# Patient Record
Sex: Female | Born: 1935 | Race: White | Hispanic: No | Smoking: Never smoker
Health system: Southern US, Community
[De-identification: ages and names within clinical notes are randomized; demographics above are authoritative.]

## PROBLEM LIST (undated history)

## (undated) DIAGNOSIS — IMO0001 Reserved for inherently not codable concepts without codable children: Secondary | ICD-10-CM

## (undated) DIAGNOSIS — Z5189 Encounter for other specified aftercare: Secondary | ICD-10-CM

## (undated) DIAGNOSIS — I2510 Atherosclerotic heart disease of native coronary artery without angina pectoris: Secondary | ICD-10-CM

## (undated) DIAGNOSIS — I4891 Unspecified atrial fibrillation: Secondary | ICD-10-CM

## (undated) DIAGNOSIS — I10 Essential (primary) hypertension: Secondary | ICD-10-CM

## (undated) DIAGNOSIS — I509 Heart failure, unspecified: Secondary | ICD-10-CM

## (undated) DIAGNOSIS — I739 Peripheral vascular disease, unspecified: Secondary | ICD-10-CM

## (undated) DIAGNOSIS — E119 Type 2 diabetes mellitus without complications: Secondary | ICD-10-CM

## (undated) HISTORY — PX: BACK SURGERY: SHX140

## (undated) HISTORY — PX: CORONARY ARTERY BYPASS GRAFT: SHX141

## (undated) HISTORY — PX: VASCULAR SURGERY: SHX849

## (undated) HISTORY — PX: OTHER SURGICAL HISTORY: SHX169

---

## 1997-10-16 ENCOUNTER — Ambulatory Visit (HOSPITAL_COMMUNITY): Admission: RE | Admit: 1997-10-16 | Discharge: 1997-10-16 | Payer: Self-pay | Admitting: Obstetrics & Gynecology

## 2003-10-29 ENCOUNTER — Inpatient Hospital Stay (HOSPITAL_COMMUNITY): Admission: RE | Admit: 2003-10-29 | Discharge: 2003-11-05 | Payer: Self-pay | Admitting: Neurosurgery

## 2004-12-04 ENCOUNTER — Ambulatory Visit (HOSPITAL_COMMUNITY): Admission: RE | Admit: 2004-12-04 | Discharge: 2004-12-04 | Payer: Self-pay | Admitting: Neurosurgery

## 2004-12-18 ENCOUNTER — Inpatient Hospital Stay (HOSPITAL_COMMUNITY): Admission: RE | Admit: 2004-12-18 | Discharge: 2004-12-23 | Payer: Self-pay | Admitting: Neurosurgery

## 2004-12-18 ENCOUNTER — Ambulatory Visit: Payer: Self-pay | Admitting: Physical Medicine & Rehabilitation

## 2005-06-24 ENCOUNTER — Ambulatory Visit: Payer: Self-pay | Admitting: Internal Medicine

## 2006-06-27 ENCOUNTER — Ambulatory Visit: Payer: Self-pay | Admitting: Internal Medicine

## 2006-09-21 ENCOUNTER — Ambulatory Visit: Payer: Self-pay | Admitting: Cardiology

## 2007-01-02 ENCOUNTER — Ambulatory Visit: Payer: Self-pay | Admitting: Ophthalmology

## 2007-01-09 ENCOUNTER — Ambulatory Visit: Payer: Self-pay | Admitting: Ophthalmology

## 2007-02-07 ENCOUNTER — Ambulatory Visit: Payer: Self-pay | Admitting: Ophthalmology

## 2007-02-13 ENCOUNTER — Ambulatory Visit: Payer: Self-pay | Admitting: Ophthalmology

## 2007-06-30 ENCOUNTER — Ambulatory Visit: Payer: Self-pay | Admitting: Internal Medicine

## 2008-07-01 ENCOUNTER — Ambulatory Visit: Payer: Self-pay | Admitting: Internal Medicine

## 2008-07-29 ENCOUNTER — Ambulatory Visit (HOSPITAL_COMMUNITY): Admission: RE | Admit: 2008-07-29 | Discharge: 2008-07-29 | Payer: Self-pay | Admitting: Neurosurgery

## 2008-08-27 ENCOUNTER — Inpatient Hospital Stay (HOSPITAL_COMMUNITY): Admission: RE | Admit: 2008-08-27 | Discharge: 2008-09-04 | Payer: Self-pay | Admitting: Neurosurgery

## 2008-09-02 ENCOUNTER — Ambulatory Visit: Payer: Self-pay | Admitting: Physical Medicine & Rehabilitation

## 2008-09-04 ENCOUNTER — Encounter: Payer: Self-pay | Admitting: Internal Medicine

## 2008-09-13 ENCOUNTER — Encounter: Payer: Self-pay | Admitting: Internal Medicine

## 2008-11-04 ENCOUNTER — Encounter: Payer: Self-pay | Admitting: Neurosurgery

## 2008-11-11 ENCOUNTER — Encounter: Payer: Self-pay | Admitting: Neurosurgery

## 2008-12-12 ENCOUNTER — Encounter: Payer: Self-pay | Admitting: Neurosurgery

## 2009-07-03 ENCOUNTER — Ambulatory Visit: Payer: Self-pay | Admitting: Internal Medicine

## 2010-07-21 IMAGING — RF DG MYELOGRAM LUMBAR
11 series · 11 of 11 positions shown · non-contrast
Comparison: 12/04/2004.

CLINICAL DATA: Left leg pain weakness.

MYELOGRAM LUMBAR
TECHNIQUE: Contrast was instilled by Dr. Dr. Wick at the L3-4
level.
TECHNIQUE: CT imaging of the lumbar spine was performed after
intrathecal contrast administration.  Multiplanar CT image
reconstructions were also generated.

[Series 1: run · 1 of 1 slices shown (1 of 11)]
[im 1/1]
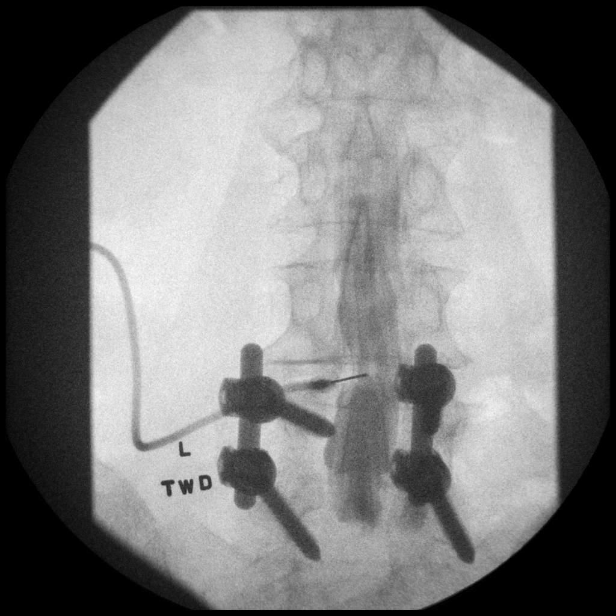

[Series 2: run · 1 of 1 slices shown (2 of 11)]
[im 1/1]
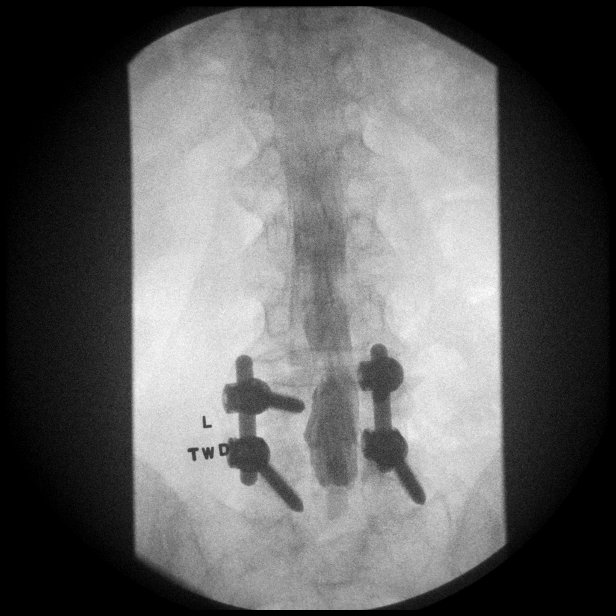

[Series 3: run · 1 of 1 slices shown (3 of 11)]
[im 1/1]
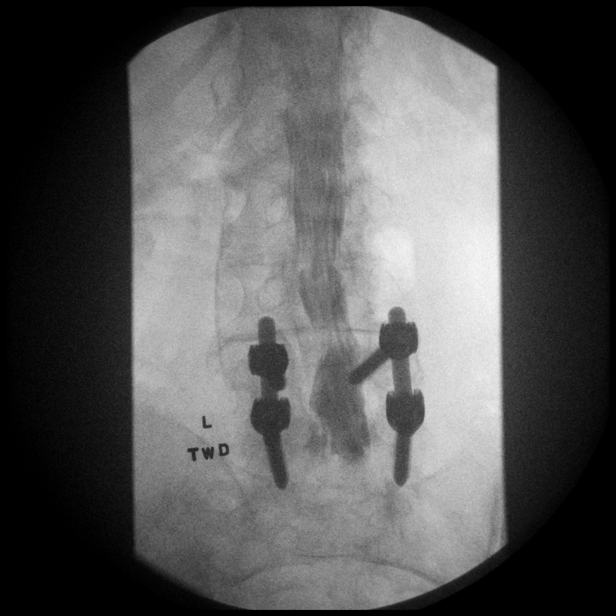

[Series 4: run · 1 of 1 slices shown (4 of 11)]
[im 1/1]
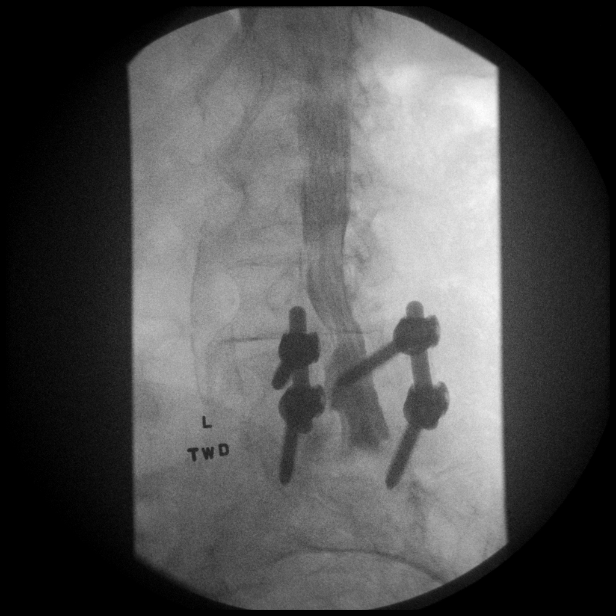

[Series 5: run · 1 of 1 slices shown (5 of 11)]
[im 1/1]
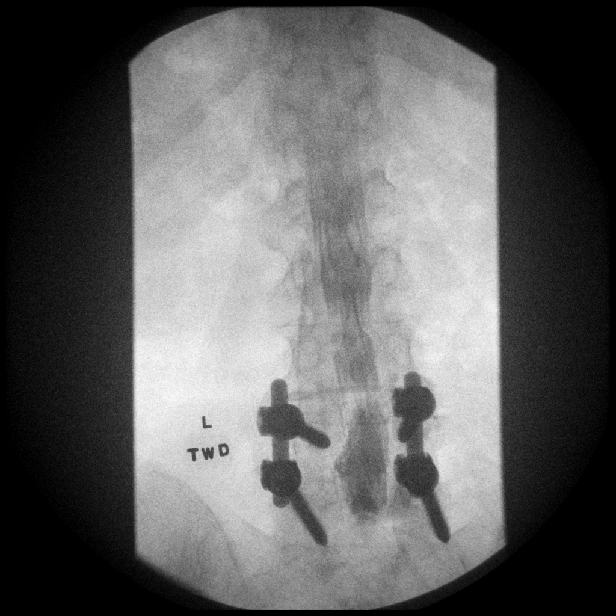

[Series 6: run · 1 of 1 slices shown (6 of 11)]
[im 1/1]
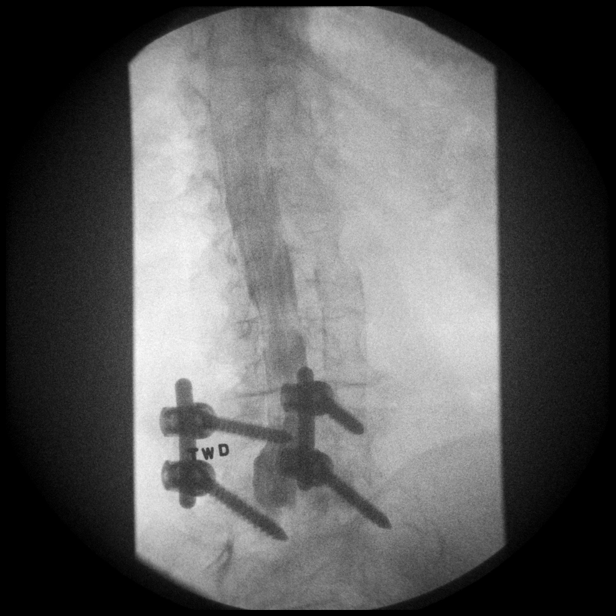

[Series 7: run · 1 of 1 slices shown (7 of 11)]
[im 1/1]
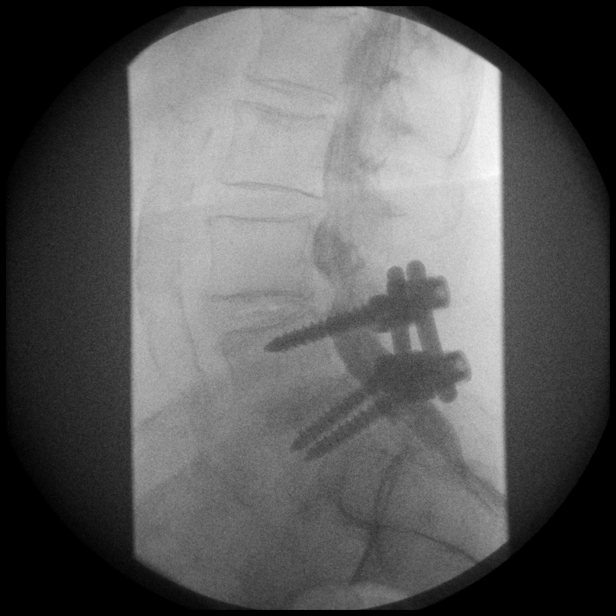

[Series 8: run · 1 of 1 slices shown (8 of 11)]
[im 1/1]
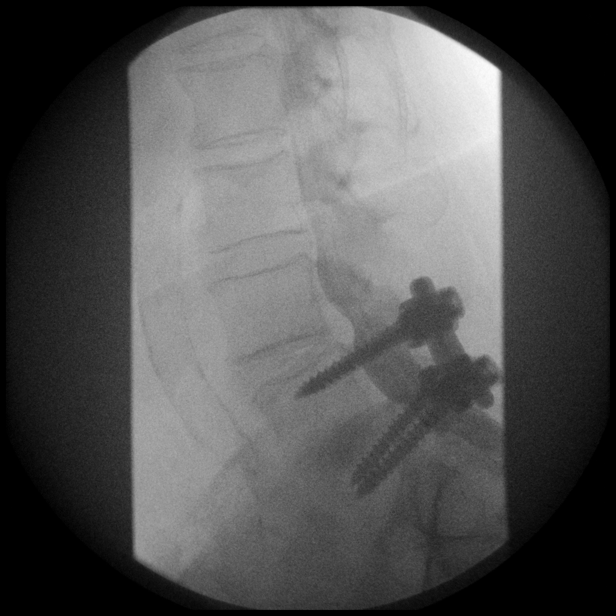

[Series 9: run · 1 of 1 slices shown (9 of 11)]
[im 1/1]
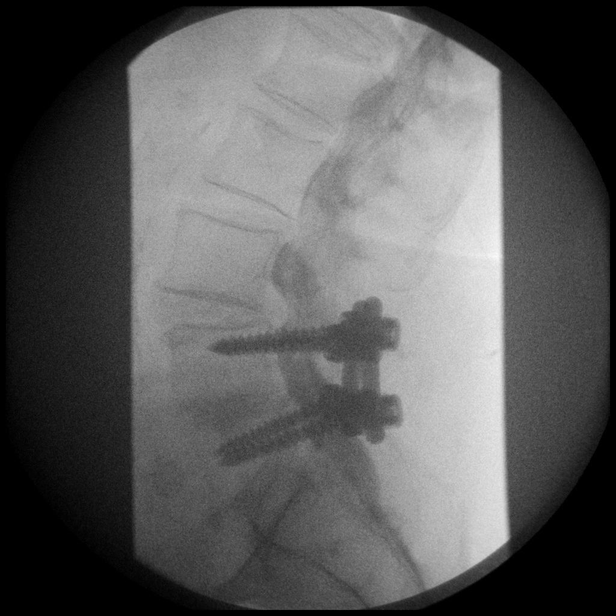

[Series 10: run · 1 of 1 slices shown (10 of 11)]
[im 1/1]
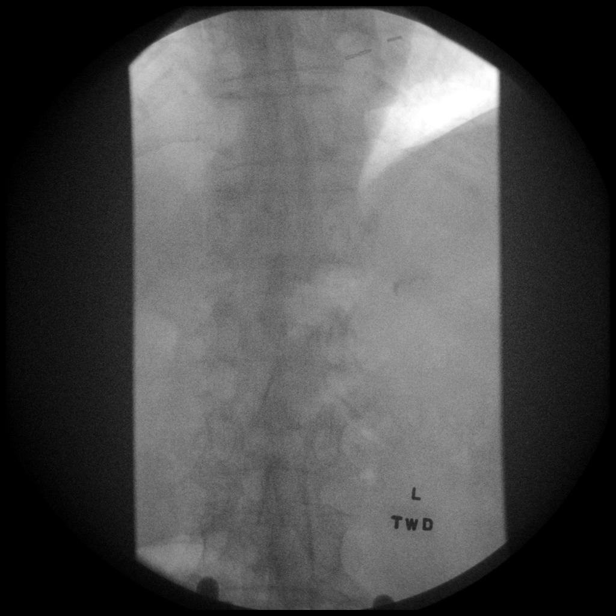

[Series 11: run · 1 of 1 slices shown (11 of 11)]
[im 1/1]
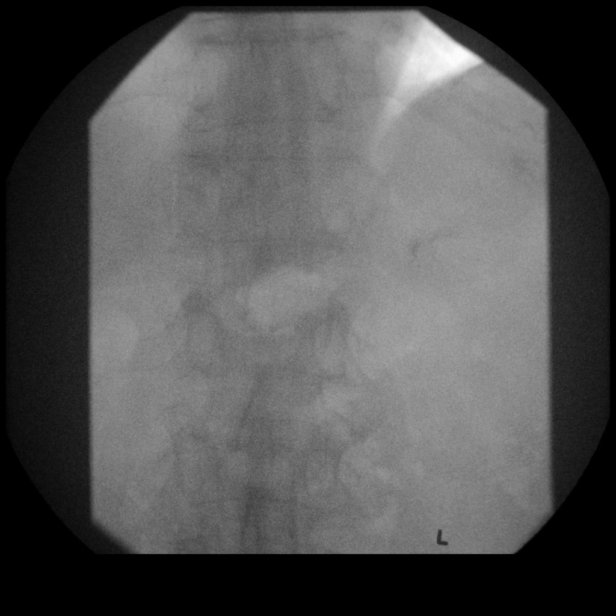

[11 of 11 positions shown; findings below may reference images not displayed]

FINDINGS: Nerve roots appears slightly thickened and may represent
arachnoiditis type changes as previously question.  Status post
fusion L4-5 level. Moderate impression upon the left lateral aspect
of the thecal sac at the L3-4 level.  Ventral impression upon the
thecal sac at the L3-4 level which is slightly more notable in the
extension.  Minimal anterior slip of L3 and L4.  No excess motion
occurs at these levels between flexion and extension.  Calcified
aorta.
IMPRESSION: Prior fusion L4-5 level.

Ventral impression upon the thecal sac at the L3-4 level as well as
mass effect upon the left lateral aspect of the thecal sac at the
L3-4 level .

Mild anterior slip of L3 and L4 without abnormal motion between
flexion and extension.

Thickened nerve roots may represent changes of arachnoiditis as
previously question.

Calcified aorta.

CT MYELOGRAPHY LUMBAR SPINE
FINDINGS: The present examination incorporates from the lower T10
through the lower sacrum.  Conus just below the T12-L1 disc space.
Thickening of nerve roots may represent changes of arachnoiditis as
previously questioned.

Calcified mildly ectatic aorta incompletely evaluated on the
present exam.

T10-11:  Mild disc degeneration with minimal bulge.

T11-12:  Tiny left foraminal disc protrusion.  Minimal encroachment
upon the exiting nerve root.

T12-L1:  Mild bulge with small right posterior lateral disc
protrusion which extends cephalad and caudad from the disc space
causing mild impression upon the right ventral aspect of thecal
sac.

L1-2: Mild bilateral facet joint degenerative changes.  Minimal
bulge.

L2-3: Bilateral facet joint degenerative changes.  Moderate bulge.
Mild ligament flavum hypertrophy.  Multifactorial mild to slightly
moderate spinal stenosis and mild bilateral foraminal narrowing.

L3-4: Moderate bilateral facet joint degenerative changes.
Moderate broad-based disc protrusion with greatest extension left
posterior lateral/foraminal position.  This combined with left
greater right facet joint bony overgrowth causes marked left-sided
and moderate right-sided lateral recess stenosis.  Left greater
right spinal stenosis.  Moderate bilateral foraminal narrowing and
encroachment upon the course exiting L3 nerve roots.

L4-5: Fusion with pedicle screws and interbody fusion device.
Right L5 pedicle screw traverses the anterior cortex and is
immediately posterior to the right common iliac vein. Left L5
pedicle screw traverses the medial superior cortex of the neural
foramen.  Minimal anterior slip of L4.  Mild bilateral foraminal
narrowing.  Spur left posterior lateral aspect L4 vertebral body
does not cause significant deformity of the adjacent thecal sac.

L5-S1: Prior laminectomy.  Bulge/osteophyte greatest right
foraminal position.  Right greater left foraminal narrowing.
Slightly prominent dural fat.
IMPRESSION: Prior fusion L4-L5 and remote surgery L5-S1.

In this patient who is presenting with left sided symptoms, the
most notable left-sided findings are at the L3-4 level as described
above.

Findings suggestive of arachnoiditis unchanged from prior exam.

Small right posterior lateral T12-L1 disc protrusion unchanged.

L2-3 multifactorial mild to slightly moderate spinal stenosis and
mild bilateral foraminal narrowing.

L5-S1 right greater left foraminal narrowing.

## 2011-01-26 NOTE — Discharge Summary (Signed)
[name], [name]                ACCOUNT NO.:  [id number]   MEDICAL RECORD NO.:  [id number]          PATIENT TYPE:  INP   LOCATION:  3034                         FACILITY:  [facility]   PHYSICIAN:  Hilda Lias, M.D.   DATE OF BIRTH:  1925/09/11   DATE OF ADMISSION:  08/27/2008  DATE OF DISCHARGE:  09/04/2008                               DISCHARGE SUMMARY   ADMISSION DIAGNOSES:  1. L3-L4 spondylolisthesis with stenosis, status post L4-5 fusion.  2. Obesity.  3. Diabetes mellitus.   FINAL DIAGNOSES:  1. L3-L4 spondylolisthesis with stenosis, status post L4-5 fusion.  2. Obesity.  3. Diabetes mellitus.   CLINICAL HISTORY:  [name] is a lady who about 5 years ago underwent  fusion at L4-5.  Lately she had been coming to my office complaining of  back pain with radiation to both legs.  We did an outpatient x-ray which  showed that she has a stenosis/spondylolisthesis at the level of 3-4,  which is essentially the level above from the previous surgery.  The  patient is being admitted for surgery.   LABORATORY:  Essentially within normal limits except lately secondary to  her inactivity she had been having some hypoglycemic episodes.   COURSE IN THE HOSPITAL:  The patient was taken to surgery and L3-L4  fusion was done.  After that the patient had been quite slow to get  around.  She had been complaining of pain in the left knee and she was  seen by orthopedic surgeon who found that she had mostly arthritis and  not to require surgery.  The surgery was 3-4 fusion.  Lately for the  past 24 hours she is doing much better.  She is ambulating with the help  of a walker and indeed she realized that she is much better than she was  at the end of the weekend.  She is going to be transferred to surgical  skilled facility in [place] close to her family.   CONDITION ON DISCHARGE:  Stable.   MEDICATION:  She will continue taking all the medication.  We are going  to put the insulin on  hold.  A separate copy of the medication will be  given.   ACTIVITY:  She is going to continue at least twice a day physical  therapy and occupational therapy.   FOLLOWUP:  I will plan to see her in my office in 4 weeks or before.  If  there is any question while she is in the hospital, they can call my  office, or they can call her medical doctor who is [facility].           ______________________________  Hilda Lias, M.D.     EB/MEDQ  D:  09/04/2008  T:  09/04/2008  Job:  [id number]

## 2011-01-26 NOTE — Op Note (Signed)
[name], [name] NO.:  [id number]   MEDICAL RECORD NO.:  [id number]          PATIENT TYPE:  INP   LOCATION:  3034                         FACILITY:  [facility]   PHYSICIAN:  Hilda Lias, M.D.   DATE OF BIRTH:  07-28-1925   DATE OF PROCEDURE:  08/27/2008  DATE OF DISCHARGE:                               OPERATIVE REPORT   PREOPERATIVE DIAGNOSES:  L3-L4 stenosis with radiculopathy, left worse  than right one and spondylolisthesis status post 4-5 fusion.   POSTOPERATIVE DIAGNOSES:  L3-L4 stenosis with radiculopathy, left worse  than right one and spondylolisthesis status post 4-5 fusion.   PROCEDURES:  L3-L4 diskectomy, transforaminal lumbar interbody fusion  with a cage of 11 x 20 size with a BMP and autograft, pedicle screws L3,  cutting of the previous rod, new rod from L3-L4, posterolateral  arthrodesis, Cell Saver, C-arm.   SURGEON:  Hilda Lias, MD   ASSISTANT:  Cristi Loron, MD   CLINICAL HISTORY:  [name] is a lady who many years ago underwent  fusion at the level L4-5.  Lately, she had been complaining of back pain  with radiation to both legs, left worse than right one.  The patient has  failure with conservative treatment including epidural injection.  X-ray  shows stenosis plus spondylolisthesis at the level T4, so surgery was  advised.   PROCEDURE:  The patient was taken to the OR and she was positioned in a  prone manner.  The back was cleaned with DuraPrep.  We were unable to  feel any bone whatsoever secondary to her obesity.  The incision in the  midline along the previous one was made and muscles were retracted  laterally.  We were able to see the screws of L4 and dissection was  carried down until we were able to feel the lamina of L3.  The patient  has no spinous process of L3.  Then with a drill, we removed the lamina  of L3 bilaterally.  The patient had quite a bit of adhesion, the right  worse than the left one.  We were  able to see and feel the thecal sac.  Decompression was done, and in the left side, we were able to decompress  the L3 and L4 nerve root.  Lysis of adhesion was done.  In the right  side, it was quite difficult.  The dura mater was adherent to the floor,  it was difficult to mobilize.  At the end, we did lysis to decompress  the L3 and L4 nerve root.  Again secondary to the adhesion, we were able  to get into the disk space at this level.  Because of that, we entered  the disk space on the left side achieving a total gross diskectomy.  Facetectomy has to be done just to allow [place] to give more room.  The  endplates were removed.  Then, single cage banana shape of 11 x 25 was  inserted.  Inside the cage, we had a BMP and autograft.  Then using the  C-arm, we drilled the pedicle of L3  in AP and lateral view.  We were  able to see that the hole was surrounded by bone 360 degrees.  We  introduced 2 pedicle screws of 5.5 x 45.  Then, the previous rod was  cut.  Immediately when we used the drill to cut the rod, we stopped  using the Cell Saver.  Then, a new rod of 30 mm was inserted between L3-  L4 and  was secured in place.  The area was compressed to prevent any slippage  of the cage.  Then, the lateral aspect of the facet of L3-4 were drilled  and a mix of BMP and autograft was used for arthrodesis.  Fentanyl was  left in the epidural space and the wound was closed with Vicryl and  Steri-Strips.           ______________________________  Hilda Lias, M.D.     EB/MEDQ  D:  08/27/2008  T:  08/28/2008  Job:  [id number]

## 2011-01-26 NOTE — Consult Note (Signed)
NAMEAVALON, COPPINGER NO.:  [id number]   MEDICAL RECORD NO.:  [id number]          PATIENT TYPE:  INP   LOCATION:  3034                         FACILITY:  [facility]   PHYSICIAN:  Doralee Albino. Carola Frost, M.D. DATE OF BIRTH:  Jan 18, 1925   DATE OF CONSULTATION:  09/02/2008  DATE OF DISCHARGE:                                 CONSULTATION   REQUESTING PHYSICIAN:  Hilda Lias, MD   REASON FOR CONSULTATION:  Left knee weakness and pain.   BRIEF HISTORY OF PRESENTATION:  [name] is a very pleasant 77-year-  old female status post lumbar decompression and fusion for chronic  radiculopathy, left worse than right.  She has had a difficult course  postoperatively because of left knee buckling and some pain.  She is  frustrated at her inability to mobilize and work with the nurses and  therapist effectively.  Dr. Jeral Fruit requested we see the patient  regarding the possibility of primary process in the knee restricting her  mobility.  She does not report a history of significant knee discomfort  but has had gout.  Gout has been restricted primarily to her fingers.   Past medical history, medications, social history, and review of systems  were reviewed and included in the chart.   Pertinent issues other than her 5 surgeries on the back include:  1. CAD status post CABG.  2. Diabetes.  3. Peripheral vascular disease.  4. Renal insufficiency.  5. Degenerative disk disease.   The patient was driving up until 2 months ago.   [name] is alert, oriented, and quite pleasant.  She appears  appropriate for stated age but does have an increased body mass index.  Focal comparison of both lower extremities reveals the following: no  sign of external trauma with no significant malalignment or deformity  either.  The muscle examination reveals 4-/5 quad strength and 5/5  hamstring strength on the left.  There is no effusion present to  palpation of the knee bilaterally.  There is  some mild patellofemoral  and medial joint line tenderness.  Negative McMurray examination for  pain or palpable meniscal fragment.  No instability to varus-valgus or  anterior-posterior force at full extension, 30 degrees of flexion.  There is no significant edema distally, and the patient has no  restriction to motion of the ankle, foot, or toes.   X-RAYS:  AP and lateral left knee films were reviewed demonstrating some  very faint calcification within the menisci, but excellent alignment of  the knee and even wear with mild degenerative changes, which seem  largely age specific.  No acute fractures noted.   ASSESSMENT:  1. Mild arthritis, possible early gout flare.  2. Significant quadriceps weakness.   PLAN:  I discussed with the patient as well as Dr. Jeral Fruit the options  which would include diagnostic and therapeutic Marcaine and steroid  injection into the knee, which carries the risk of infection that could  complicate the spinal surgery, and also possible NSAIDs on the  presumption of gout treatment.  NSAIDs of course would increase her risk  of  nonunion of the fusion and carry significant risk as well.  As the  clinical and historical pictures seem to point toward the quadriceps  weakness as the etiology for knee buckling and mild pain, I have  recommended instead redoubling the efforts on PT and then treating the  knee on a delayed basis as an outpatient.  I would be happy to see her  in the office should this be desired and can make arrangements at [phone].   Thank you so much for the opportunity to see this patient.      Doralee Albino. Carola Frost, M.D.  Electronically Signed     MHH/MEDQ  D:  09/02/2008  T:  09/03/2008  Job:  [id number]

## 2011-01-26 NOTE — H&P (Signed)
NAMEBRYTTANY, TORTORELLI NO.:  [id number]   MEDICAL RECORD NO.:  [id number]          PATIENT TYPE:  INP   LOCATION:  3034                         FACILITY:  [facility]   PHYSICIAN:  Hilda Lias, M.D.   DATE OF BIRTH:  01/04/1925   DATE OF ADMISSION:  08/27/2008  DATE OF DISCHARGE:                              HISTORY & PHYSICAL   [name] is a lady who in the past underwent fusion at the level of L4-  L5 because of chronic radiculopathy secondary to degenerative disk  disease.  The patient did well.  This procedure was done about 5 years  ago.  Lately, she had come into my office complaining of back pain  rising to both her legs, left worse than right one.  We did some x-rays,  and it showed that indeed she has a stenosis with spondylolisthesis at  one level above along the previous surgery.  She had failed conservative  treatment including epidural injection.  Because of that she wants to  proceed with surgery.   PAST MEDICAL HISTORY:  1. L4-5 fusion.  2. She also had a lumbar diskectomy x2.  3. Shoulder surgery.  4. Open heart surgery.   ALLERGIES:  She is allergic to DEMEROL.   SOCIAL HISTORY:  Negative.  She is 5 feet tall, and 200 pounds.   FAMILY HISTORY:  Unremarkable.   REVIEW OF SYSTEMS:  Positive for high cholesterol, high blood pressure,  back pain, left leg weakness, and diabetes.   PHYSICAL EXAMINATION:  GENERAL:  The patient came to my office and she  was limping from the left leg.  HEAD, EYES, EARS, NOSE AND THROAT:  Normal.  NECK:  Normal.  LUNGS:  There is some mild rhonchi bilaterally.  CARDIOVASCULAR:  There is some scar from previous surgery.  ABDOMEN:  Negative.  EXTREMITIES:  Grade 1 edema.  NEUROLOGIC:  Mental status normal.  Cranial nerves normal.  Strength;  she has some weakness of the quadriceps on the left side.  The femoral  stretch maneuver is positive bilaterally, worse on the left side.  Reflexes seem to be normal.  The  x-rays showed that indeed she has  degenerative disk disease plus stenosis with spondylolisthesis along L3-  4, which is one level above.   CLINICAL IMPRESSION:  L3-L4 spondylolisthesis with a stenosis.  Status  post pedicle screws at L4-5.   RECOMMENDATIONS:  The patient is being admitted for surgery.  The  procedure would be bilateral L3 laminectomy with interbody fusion with  cages and pedicle screws.  The patient knows about the risks of  infection, CSF leak, worsening pain, need for surgery, and no  improvement whatsoever, also all the risks associated with her obesity  and heart disease.           ______________________________  Hilda Lias, M.D.     EB/MEDQ  D:  08/27/2008  T:  08/28/2008  Job:  [id number]

## 2011-01-29 NOTE — Op Note (Signed)
[name], [name]                ACCOUNT NO.:  [id number]   MEDICAL RECORD NO.:  [id number]          PATIENT TYPE:  INP   LOCATION:  3011                         FACILITY:  [facility]   PHYSICIAN:  Hilda Lias, M.D.   DATE OF BIRTH:  25-Feb-1925   DATE OF PROCEDURE:  12/18/2004  DATE OF DISCHARGE:                                 OPERATIVE REPORT   PREOPERATIVE DIAGNOSES:  1.  Degenerative disk disease, L4-5, with spondylolisthesis.  2.  Chronic left 4-5 radiculopathy.  3.  Status post 2 lumbar surgery.  4.  Diabetes mellitus.  5.  Obesity.   POSTOPERATIVE DIAGNOSES:  1.  Degenerative disk disease, L4-5, with spondylolisthesis.  2.  Chronic left 4-5 radiculopathy.  3.  Status post 2 lumbar surgery.  4.  Diabetes mellitus.  5.  Obesity.   PROCEDURES:  1.  Bilateral L4-5 diskectomy.  2.  Interbody fusion with allograft.  3.  Posterolateral arthrodesis.  4.  Pedicle screws to L4-5.  5.  Lysis of adhesions.  6.  Foraminotomy.  7.  Decompression of the L4-5 nerve root.  8.  Bilateral L3 laminectomies.   SURGEON:  Hilda Lias, M.D.   ASSISTANT:  Danae Orleans. Venetia Maxon, M.D.   CLINICAL HISTORY:  [name] is a lady who is being admitted because of back  pain with radiation mostly to the left leg.  In the past, the patient has  had two lumbar procedures.  X-ray shows spondylolisthesis between L4-5 with  stenosis at the level of L3-4.  Surgery was advised.  She and her knew about  the risks, especially because of her obesity and diabetes.   DESCRIPTION OF PROCEDURE:  The patient was taken to the OR and she was  positioned in a prone manner.  It was difficult to position her because she  is short and obese.  Nevertheless, at the end we had to use tapes to keep  her on the operating room table.  Then the back was prepped with Betadine.  She had two lumbar incisions and we selected the scar in the midline.  Dissection was made and the incision was carried all the way down to at  least 3 inches of fat tissue down to the midline.  The anatomy was so  difficult that it was difficult to find out where the midline was.  The  dissection was carried down 45 mm.  Later we were able to find the L3  spinous process.  X-ray showed that indeed that was the L3 spinous process,  as well as the L4-5 disk.  During lysis of adhesions, we did not see dura  matter.  Because she had lumbar stenosis, we proceeded with removal of  spinous process of the lamina of L3.  From then on, we worked our way down  until we were able to decompress the L3, L4 and L5 nerve roots.  The roots  were retracted and we entered into the disk space.  Total gross diskectomy  was accomplished.  Interbody fusion with allograft using a bone wedge of 8 x  26 filed down to 22  was used.  Autograft was used to fill up the rest of the  space.  From then on using the C-arm we identified the pedicle of L4 and L5.  Again it was difficult to see the pedicle because of her obesity.  Nevertheless, using the pedicle probe, as well as the bone probe and the C-  arm, we were able to introduce the four pedicle screws at level of 4 and 5.  AP and lateral showed good position.  Then rod was used for the L4-5 and it  was secured in place using the Capps.  Then the periosteum of the L4 and L5  transverse process were removed and autograft was used to fill up the area.  Hemostasis was done with bipolar.  Fentanyl was used in the epidural space  and the wound was closed with Vicryl and Dermabond.  The patient will be  going to the PACU.      EB/MEDQ  D:  12/18/2004  T:  12/19/2004  Job:  [id number]

## 2011-01-29 NOTE — H&P (Signed)
NAMEVERNEE, BAINES NO.:  [id number]   MEDICAL RECORD NO.:  [id number]          PATIENT TYPE:  INP   LOCATION:  3011                         FACILITY:  [facility]   PHYSICIAN:  Hilda Lias, M.D.   DATE OF BIRTH:  1924-12-30   DATE OF ADMISSION:  12/18/2004  DATE OF DISCHARGE:                                HISTORY & PHYSICAL   CLINICAL HISTORY:  Ms. Kulig is a lady who in the past underwent surgery  because of spondylosis mostly at the level of lumbar 5-6.  The patient has  what looks like six lumbar vertebrae.  Nevertheless, she did well but now  she is complaining of mostly back pain again with radiation into the left  leg which is not any better despite conservative treatment which include  medication and epidural injection.  X-rays show degenerative disk disease  with spondylolisthesis and because of these findings, she is being admitted  for surgery.   PAST MEDICAL HISTORY:  1.  Lumbar surgery twice.  2.  Shoulder surgery.  3.  Open heart surgery.   ALLERGIES:  SHE IS ALLERGIC TO DEMEROL.   SOCIAL HISTORY:  Negative.   REVIEW OF SYSTEMS:  Positive for high cholesterol, diabetes, high blood  pressure, and back and left leg pain.   PHYSICAL EXAMINATION:  GENERAL:  The patient came to my office with her son.  She is limping from the left leg.  She is quite miserable.  She tells me  that she wants anything to be done because she can not live any longer with  the pain that she is having.  HEENT:  Unremarkable.  NECK:  Unremarkable.  LUNGS:  Mild rhonchi bilaterally.  HEART:  There is midline scar from a previous surgery.  The sounds are  normal.  ABDOMEN:  Normal.  EXTREMITIES:  Grade 1 edema.  NEURO:  Mental status normal.  Cranial nerves normal.  Strength 5/5, except  for some weakness on dorsiflexing the left foot.  She has __________ lumbar  spine and she has some numbness and tingling sensation which involves mostly  the lower area.   The x-ray  shows that she has calcification of the aorta.  She has a  myelogram, which done as an outpatient, spondylolisthesis between 4-5 with  lumbar stenosis.   CLINICAL IMPRESSION:  1.  L4-L5 spondylolisthesis.  2.  Lumbar stenosis.   RECOMMENDATIONS:  The patient will be admitted for surgery.  The procedure  will be a decompressive laminectomy with interbody fusion with allograft and  pedicle screws at the level of L4-L5.  She knows about the risks of  infection, CSF leak, worsening pain, paralysis, need of further surgery, and  all the risks associated with her diabetes and high blood pressure.      EB/MEDQ  D:  12/18/2004  T:  12/19/2004  Job:  [id number]

## 2011-01-29 NOTE — H&P (Signed)
[name], [name]                          ACCOUNT NO.:  [id number]   MEDICAL RECORD NO.:  [id number]                   PATIENT TYPE:  INP   LOCATION:  3004                                 FACILITY:  [facility]   PHYSICIAN:  Hilda Lias, M.D.                DATE OF BIRTH:  1924-10-21   DATE OF ADMISSION:  10/29/2003  DATE OF DISCHARGE:                                HISTORY & PHYSICAL   [name] is a lady who thirty years ago underwent a compressive  laminectomy.  I have not seen her since then.  Since then she ended up  having open heart surgery and diabetes.  For the past 2 years she has been  complaining of back pain to both arms to the right foot associated with a  weakness and diminished sensation and pain in the right hip.  She also is  well known to have __________ of the right hip.  She tells me that she is  getting worse. She has an x-ray and because of the findings she wanted to  proceed with surgery.   PAST MEDICAL HISTORY:  Lumbar surgery, shoulder surgery, open heart surgery.   ALLERGIES:  She is allergic to DEMEROL.   SOCIAL HISTORY:  Social history is negative.   FAMILY HISTORY:  Negative.  REVIEW OF SYSTEMS  Positive for chest pain, high blood pressure, high cholesterol, back pain,  left weakness and diabetes.   PHYSICAL EXAMINATION:  GENERAL:  The patient came to my office and was  having quite a bit of discomfort of the right leg.  HEAD, EYES, EARS, NOSE AND THROAT:  Normal.  NECK:  Normal.  LUNGS:  Clear.  HEART:  Heart sounds normal.  There is a midline scar from previous surgery.  EXTREMITIES:  In the lower extremities grade 1 edema.  MENTAL STATUS:  Normal.  NEUROLOGIC:  Cranial nerves normal.  Strength is 5/5.  There is some  weakness on dorsiflexion of the right foot.  Sensation:  She complained of  numbness on the top of the right foot.  A sterile dressing, SLR in the left  side positive of 80 positive at 45 degrees.  The lumbar spine okay.  Shoulder:  She has osteopenia.  She has 6 lumbar vertebrae. At the level of  L4-L5 as well as L5-L6 she has a narrowed the canal bilaterally, right worse  than the left one.   CLINICAL IMPRESSION:  Chronic L5-L6 radiculopathy.   RECOMMENDATIONS:  The patient wants to proceed with surgery.  The procedure  will be a right L4-L5 and L5-L6 foraminotomy and decompression with a  hemilaminectomy.  The procedure will be at the second and third space from  below.  She knows all the risks such as infection, CSF leak, worsening of  the pain and paralysis, needing further surgery which might need fusion, and  all the risks associated with the heart disease and  diabetes and obesity.                                                Hilda Lias, M.D.    EB/MEDQ  D:  10/29/2003  T:  10/29/2003  Job:  [id number]

## 2011-01-29 NOTE — Discharge Summary (Signed)
NAMETINESHIA, BECRAFT NO.:  [id number]   MEDICAL RECORD NO.:  [id number]          PATIENT TYPE:  INP   LOCATION:  3011                         FACILITY:  [facility]   PHYSICIAN:  Cristi Loron, M.D.DATE OF BIRTH:  March 29, 1925   DATE OF ADMISSION:  12/18/2004  DATE OF DISCHARGE:  12/23/2004                                 DISCHARGE SUMMARY   BRIEF HISTORY:  Mrs. Perko is a lady who is being admitted because of back  pain with radiation mostly to the left leg.  In the past, the patient has  had two lumbar procedures.  X-rays show spondylolisthesis between L4-5 and  stenosis at the level of L3-4.  Surgery was advised.  She knew about the  risk, especially because of her obesity and diabetes.   For further details of this admission, please refer to Dr. Cassandria Santee history  and physical.   HOSPITAL COURSE:  The patient was admitted to [facility] December 18, 2004.  On the day of admission, the patient underwent an L4-5 fusion as well  as L3 laminotomies.  The surgery went well.  (For full details of this  operation, please refer to typed operative note.)   Postoperative course:  The patient's postoperative course was more or less  unremarkable.  By December 23, 2004, the patient was afebrile, vital signs  stable.  She was eating well, ambulating well, and was requesting discharge  to home.  She was therefore discharge home on December 23, 2004.   DISCHARGE PRESCRIPTIONS:  1.  Percocet 5, #100, 1-2 p.o. q.4 h. p.r.n. for pain.  2.  Valium 2 mg, #50, one p.o. q.6 h. p.r.n. for muscle spasms.   DISCHARGE INSTRUCTIONS:  The patient was given written discharge  instructions.  Instructed to follow up with Dr. Jeral Fruit.   FINAL DIAGNOSES:  1.  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  2.  Diabetes mellitus.  3.  Obesity.   PROCEDURE PERFORMED:  Bilateral L4-5 diskectomy, interbody fusion  with  allograft, posterolateral arthrodesis, pedicle screws at L4-5, lysis of  adhesions, foraminotomy, decompression of the L4-5 nerve root, bilateral L3  laminectomies.      JDJ/MEDQ  D:  03/08/2005  T:  03/08/2005  Job:  [id number]

## 2011-01-29 NOTE — Discharge Summary (Signed)
NAMETERA, PELLICANE                          ACCOUNT NO.:  [id number]   MEDICAL RECORD NO.:  [id number]                   PATIENT TYPE:  INP   LOCATION:  3004                                 FACILITY:  [facility]   PHYSICIAN:  Hilda Lias, M.D.                DATE OF BIRTH:  26-Jun-1925   DATE OF ADMISSION:  10/29/2003  DATE OF DISCHARGE:  10/31/2003                                 DISCHARGE SUMMARY   ADMISSION DIAGNOSES:  1. Right L4-L5, L5-S1 spondylosis.  2. The patient has a sixth lumbar vertebra.   POSTOPERATIVE DIAGNOSES:  1. Right L4-L5 spondylosis.  2. Right L5-L6 herniated disc.   HISTORY OF PRESENT ILLNESS:  The patient was admitted because of back pain  with radiation down to the right leg.  This lady underwent surgery in the  past for spondylosis.  She came to my office complaining of worsening of the  pain and the x-rays show spondylosis at the level of 4-5, 5-6.  The patient  wanted to proceed with surgery.   LABORATORY DATA:  Normal.   HOSPITAL COURSE:  The patient was taken to surgery and a right L4-L5 and L5-  S1 foraminotomy and discectomy was done.  The patient lived with her husband  who had been unable to take care of her.  Nevertheless, after surgery, she  developed a __________ pain.  She had been seen by physical therapy.  Rehabilitation service felt that she was not a candidate for them.  Nevertheless, today she is feeling much better. She is able to walk and she  is going to get some help at home from [facility].   CONDITION ON DISCHARGE:  Improvement.   MEDICATIONS:  Demerol, Neurontin and she is to continue taking all of her  previous medications.   DIET:  She will continue her diabetic diet.   ACTIVITY:  She is not to drive for at least three weeks.   FOLLOW UP:  She is going to call my office to see me in five weeks.                                                Hilda Lias, M.D.    EB/MEDQ  D:  11/05/2003  T:  11/05/2003   Job:  [id number]

## 2011-01-29 NOTE — Op Note (Signed)
[name], [name]                          ACCOUNT NO.:  [id number]   MEDICAL RECORD NO.:  [id number]                   PATIENT TYPE:  INP   LOCATION:  2899                                 FACILITY:  [facility]   PHYSICIAN:  Hilda Lias, M.D.                DATE OF BIRTH:  [birth year]   DATE OF PROCEDURE:  10/29/2003  DATE OF DISCHARGE:                                 OPERATIVE REPORT   PREOPERATIVE DIAGNOSIS:  Right L4-L5, L5-L6 spondylosis.   POSTOPERATIVE DIAGNOSES:  Right L4-5 spondylosis with foraminal stenosis,  right L5-L6 herniated disk.   PROCEDURE:  Right L4-L5 foraminotomy, right L5-6  diskectomy, decompression  of the nerve root, foraminotomy, microscope.   SURGEON:  Hilda Lias, M.D.   ASSISTANT:  Hewitt Shorts, M.D.   INDICATIONS FOR PROCEDURE:  The patient was admitted because of back pain  with radiation down the right leg. This lady  in the past underwent  surgery. We knew that she had a fixed lumbar vertebra and the problem was  from the 2nd and 3rd __________  . The risks were explained in the history  and physical.   DESCRIPTION OF PROCEDURE:  The patient was taken to the operating room and  she was positioned in a prone manner. Because of her obesity, it was  difficult to palpate the spinal arthrosis.   We did a midline incision in front of the previous one and we retracted all  the way laterally down to the facet joint. Indeed, after we did lysis of  adhesions, we were able to enter into the spinal canal. We went from the top  down until we were able to retract the dura mater away from the bone itself.  We stopped in front of the bottom half, and we found at the level of 5-6 the  patient had quite a bit of stenosis. A foraminotomy to decompress the L6 and  also the L5 nerve root was done. Indeed, we found that at this level of the  L5-6 the __________  herniated disk central and to the right.   An incision was made and a large amount of  degenerative disk was removed,  then with the foraminotomy to decompress the area between L4 and 5. X-rays  showed that indeed we were in the right place. Having done this the area was  irrigated. Valsalva maneuver was  negative. __________  was left in the  pleural space and the wound was closed with Vicryl and Steri-Strips.                                               Hilda Lias, M.D.    EB/MEDQ  D:  10/29/2003  T:  10/29/2003  Job:  [id number]

## 2011-06-18 LAB — GLUCOSE, CAPILLARY
Glucose-Capillary: 101 mg/dL — ABNORMAL HIGH (ref 70–99)
Glucose-Capillary: 107 mg/dL — ABNORMAL HIGH (ref 70–99)
Glucose-Capillary: 123 mg/dL — ABNORMAL HIGH (ref 70–99)
Glucose-Capillary: 127 mg/dL — ABNORMAL HIGH (ref 70–99)
Glucose-Capillary: 127 mg/dL — ABNORMAL HIGH (ref 70–99)
Glucose-Capillary: 136 mg/dL — ABNORMAL HIGH (ref 70–99)
Glucose-Capillary: 147 mg/dL — ABNORMAL HIGH (ref 70–99)
Glucose-Capillary: 148 mg/dL — ABNORMAL HIGH (ref 70–99)
Glucose-Capillary: 149 mg/dL — ABNORMAL HIGH (ref 70–99)
Glucose-Capillary: 149 mg/dL — ABNORMAL HIGH (ref 70–99)
Glucose-Capillary: 161 mg/dL — ABNORMAL HIGH (ref 70–99)
Glucose-Capillary: 164 mg/dL — ABNORMAL HIGH (ref 70–99)
Glucose-Capillary: 176 mg/dL — ABNORMAL HIGH (ref 70–99)
Glucose-Capillary: 193 mg/dL — ABNORMAL HIGH (ref 70–99)
Glucose-Capillary: 194 mg/dL — ABNORMAL HIGH (ref 70–99)
Glucose-Capillary: 217 mg/dL — ABNORMAL HIGH (ref 70–99)
Glucose-Capillary: 53 mg/dL — ABNORMAL LOW (ref 70–99)
Glucose-Capillary: 62 mg/dL — ABNORMAL LOW (ref 70–99)
Glucose-Capillary: 69 mg/dL — ABNORMAL LOW (ref 70–99)
Glucose-Capillary: 73 mg/dL (ref 70–99)
Glucose-Capillary: 82 mg/dL (ref 70–99)
Glucose-Capillary: 83 mg/dL (ref 70–99)
Glucose-Capillary: 87 mg/dL (ref 70–99)
Glucose-Capillary: 96 mg/dL (ref 70–99)

## 2011-06-18 LAB — POCT I-STAT 7, (LYTES, BLD GAS, ICA,H+H)
Acid-Base Excess: 2 mmol/L (ref 0.0–2.0)
Calcium, Ion: 1.15 mmol/L (ref 1.12–1.32)
O2 Saturation: 100 %
Patient temperature: 35.9
Potassium: 3.3 mEq/L — ABNORMAL LOW (ref 3.5–5.1)
TCO2: 27 mmol/L (ref 0–100)
pCO2 arterial: 35.5 mmHg (ref 35.0–45.0)

## 2011-06-18 LAB — BASIC METABOLIC PANEL
CO2: 32 mEq/L (ref 19–32)
Calcium: 10 mg/dL (ref 8.4–10.5)
Chloride: 92 mEq/L — ABNORMAL LOW (ref 96–112)
GFR calc Af Amer: 20 mL/min — ABNORMAL LOW (ref >60)
Glucose, Bld: 201 mg/dL — ABNORMAL HIGH (ref 70–99)
Potassium: 4.5 mEq/L (ref 3.5–5.1)
Sodium: 137 mEq/L (ref 135–145)

## 2011-06-18 LAB — CBC
HCT: 41.3 % (ref 36.0–46.0)
Hemoglobin: 14.1 g/dL (ref 12.0–15.0)
MCHC: 33.7 g/dL (ref 30.0–36.0)
MCHC: 34.1 g/dL (ref 30.0–36.0)
MCV: 91.5 fL (ref 78.0–100.0)
MCV: 92.8 fL (ref 78.0–100.0)
Platelets: 185 10*3/uL (ref 150–400)
RBC: 3.46 MIL/uL — ABNORMAL LOW (ref 3.87–5.11)
RBC: 4.52 MIL/uL (ref 3.87–5.11)
RDW: 13.7 % (ref 11.5–15.5)
WBC: 12.5 10*3/uL — ABNORMAL HIGH (ref 4.0–10.5)

## 2011-06-18 LAB — COMPREHENSIVE METABOLIC PANEL
ALT: 19 U/L (ref 0–35)
AST: 34 U/L (ref 0–37)
Albumin: 2.7 g/dL — ABNORMAL LOW (ref 3.5–5.2)
Calcium: 7.9 mg/dL — ABNORMAL LOW (ref 8.4–10.5)
Creatinine, Ser: 1.41 mg/dL — ABNORMAL HIGH (ref 0.4–1.2)
GFR calc Af Amer: 43 mL/min — ABNORMAL LOW (ref >60)
Sodium: 136 mEq/L (ref 135–145)
Total Protein: 4.7 g/dL — ABNORMAL LOW (ref 6.0–8.3)

## 2011-06-18 LAB — POCT I-STAT GLUCOSE: Glucose, Bld: 82 mg/dL (ref 70–99)

## 2013-12-20 ENCOUNTER — Ambulatory Visit: Payer: Self-pay | Admitting: Cardiology

## 2014-01-25 DIAGNOSIS — N183 Chronic kidney disease, stage 3 unspecified: Secondary | ICD-10-CM | POA: Insufficient documentation

## 2014-01-25 DIAGNOSIS — I509 Heart failure, unspecified: Secondary | ICD-10-CM | POA: Insufficient documentation

## 2014-01-25 DIAGNOSIS — I10 Essential (primary) hypertension: Secondary | ICD-10-CM | POA: Insufficient documentation

## 2014-01-25 DIAGNOSIS — E119 Type 2 diabetes mellitus without complications: Secondary | ICD-10-CM | POA: Insufficient documentation

## 2014-01-25 DIAGNOSIS — E785 Hyperlipidemia, unspecified: Secondary | ICD-10-CM | POA: Insufficient documentation

## 2014-01-25 DIAGNOSIS — M1990 Unspecified osteoarthritis, unspecified site: Secondary | ICD-10-CM | POA: Insufficient documentation

## 2014-01-25 DIAGNOSIS — I272 Pulmonary hypertension, unspecified: Secondary | ICD-10-CM | POA: Insufficient documentation

## 2014-01-25 DIAGNOSIS — I2510 Atherosclerotic heart disease of native coronary artery without angina pectoris: Secondary | ICD-10-CM | POA: Insufficient documentation

## 2014-01-25 DIAGNOSIS — I50810 Right heart failure, unspecified: Secondary | ICD-10-CM | POA: Insufficient documentation

## 2014-11-12 ENCOUNTER — Ambulatory Visit: Payer: Self-pay | Admitting: Vascular Surgery

## 2015-01-12 NOTE — Op Note (Signed)
PATIENT NAME:  [name], [name] MR#:  [id number] DATE OF BIRTH:  22-Nov-1924  DATE OF PROCEDURE:  11/12/2014  PREOPERATIVE DIAGNOSIS: Atherosclerotic occlusive disease bilateral lower extremities with ulcerations of the right foot.   POSTOPERATIVE DIAGNOSIS: Atherosclerotic occlusive disease bilateral lower extremities with ulcerations of the right foot.   PROCEDURES PERFORMED: 1.  Abdominal aortogram.  2.  Right lower extremity distal runoff, third order catheter placement.  3.  Crosser atherectomy, superficial femoral artery and popliteal.  4.  Percutaneous transluminal angioplasty with Lutonix, right superficial femoral artery and popliteal.   SURGEON: Levora DredgeGregory Schnier, M.D.   SEDATION: Versed plus fentanyl.   CONTRAST USED: Isovue 94 mL.   FLUOROSCOPY TIME: 25 minutes.   INDICATIONS: [name] is an 81 year old woman who presents with severe atherosclerotic occlusive disease and gangrenous changes to both her heel as well as several toes. She is at high risk for limb loss and therefore is undergoing angiography with the hope for intervention. The risks and benefits are reviewed. All questions are answered. The patient has agreed to proceed.   DESCRIPTION OF PROCEDURE: The patient is taken to special procedures and placed in the supine position. After adequate sedation is achieved, the left groin is prepped and draped in a sterile fashion. Ultrasound is placed in a sterile sleeve. Femoral artery is identified. It is echolucent and pulsatile indicating patency. Image is recorded for the permanent record. Under real-time visualization, access to the femoral artery is obtained. A micropuncture needle followed by microwire and microsheath, J-wire followed by a 5-French sheath and 5-French pigtail catheter.   The pigtail catheter is positioned at T12 and AP projection of the aorta is obtained.   Pigtail catheter is repositioned and LAO projection of the pelvis is obtained. Stiff-angled glidewire  and pigtail catheter are then used to cross the bifurcation and a RAO projection of the groin is obtained. Wire is then negotiated into the SFA, pigtail catheter is advanced and distal runoff is obtained. Distal images are inadequate down at the level of the ankle secondary to such poor contrast flow and heavy disease burden. In fact, on the initial images from the SFA, it does not even appear that any of the tibial vessels are open.   After review of the SFA images, 4000 units of heparin is given. A stiff-angled Glidewire is introduced. A 6-French RAABE is introduced and the straight catheter is advanced over the wire down to the level of Hunter canal where magnified imaging is obtained. It does show that there is occlusion of the peroneal. From the TP trunk into the peroneal, there appears to be a [address] [place] of popliteal that is patent. There is non-complete, non-visualization of the anterior tibial and posterior tibial in their entirety. On magnified images of the ankle and foot, it does appear that the peroneal, once it is reconstituted, is patent down to the ankle giving off collaterals, but there is very poor filling of the pedal arch.   Crosser atherectomy catheter is then used to cross the SFA lesions as well as the popliteal; however, the peroneal is not re-entered. Angioplasty is then performed using 4 x 15 Lutonix balloons and flow is then markedly improved down into the extensive collaterals and geniculates that are present.   The sheath is pulled into the left iliac, oblique view is obtained and a StarClose device is deployed.   INTERPRETATION: The abdominal aorta and bilateral iliac, external and common, are widely patent.   The right common femoral and profunda femoris  is patent. SFA demonstrates diffuse disease with 3 or 4 subtotal occlusions. Popliteal is occluded. There is distal reconstitution for small isolated segment and the tibioperoneal trunk occludes. All of the origins of all 3  tibials are occluded. The peroneal reconstitutes; AT and posterior tibial do not. Distal runoff is as noted above. Following Crosser atherectomy and angioplasty, there is improvement in the SFA and popliteal, however, we still have not achieved direct in-line flow. Given the patient's renal insufficiency and the fact that we have stayed under 100 mL of contrast, I will plan for either further intervention and/or possible surgical bypass. She will be admitted overnight and kept on heparin.   ____________________________ Renford Dills, MD ggs:sb D: 11/12/2014 13:54:59 ET T: 11/12/2014 14:20:39 ET JOB#: [id number]  cc: Renford Dills, MD, <Dictator> Danella Penton, MD Renford Dills MD ELECTRONICALLY SIGNED 11/19/2014 14:28

## 2015-05-18 ENCOUNTER — Encounter: Payer: Self-pay | Admitting: Emergency Medicine

## 2015-05-18 ENCOUNTER — Emergency Department: Payer: Medicare Other

## 2015-05-18 ENCOUNTER — Emergency Department
Admission: EM | Admit: 2015-05-18 | Discharge: 2015-05-18 | Disposition: A | Discharge status: Home / Self Care | Payer: Medicare Other | Attending: Emergency Medicine | Admitting: Emergency Medicine

## 2015-05-18 DIAGNOSIS — S42491A Other displaced fracture of lower end of right humerus, initial encounter for closed fracture: Secondary | ICD-10-CM | POA: Diagnosis not present

## 2015-05-18 DIAGNOSIS — Y998 Other external cause status: Secondary | ICD-10-CM | POA: Diagnosis not present

## 2015-05-18 DIAGNOSIS — W1839XA Other fall on same level, initial encounter: Secondary | ICD-10-CM | POA: Diagnosis not present

## 2015-05-18 DIAGNOSIS — Y9289 Other specified places as the place of occurrence of the external cause: Secondary | ICD-10-CM | POA: Diagnosis not present

## 2015-05-18 DIAGNOSIS — R55 Syncope and collapse: Secondary | ICD-10-CM | POA: Insufficient documentation

## 2015-05-18 DIAGNOSIS — W19XXXA Unspecified fall, initial encounter: Secondary | ICD-10-CM

## 2015-05-18 DIAGNOSIS — S42401A Unspecified fracture of lower end of right humerus, initial encounter for closed fracture: Secondary | ICD-10-CM

## 2015-05-18 DIAGNOSIS — Y9389 Activity, other specified: Secondary | ICD-10-CM | POA: Diagnosis not present

## 2015-05-18 DIAGNOSIS — S4991XA Unspecified injury of right shoulder and upper arm, initial encounter: Secondary | ICD-10-CM | POA: Diagnosis present

## 2015-05-18 DIAGNOSIS — E119 Type 2 diabetes mellitus without complications: Secondary | ICD-10-CM | POA: Diagnosis not present

## 2015-05-18 HISTORY — DX: Essential (primary) hypertension: I10

## 2015-05-18 HISTORY — DX: Peripheral vascular disease, unspecified: I73.9

## 2015-05-18 HISTORY — DX: Reserved for inherently not codable concepts without codable children: IMO0001

## 2015-05-18 HISTORY — DX: Type 2 diabetes mellitus without complications: E11.9

## 2015-05-18 HISTORY — DX: Heart failure, unspecified: I50.9

## 2015-05-18 HISTORY — DX: Atherosclerotic heart disease of native coronary artery without angina pectoris: I25.10

## 2015-05-18 HISTORY — DX: Encounter for other specified aftercare: Z51.89

## 2015-05-18 LAB — CBC
HCT: 39.4 % (ref 35.0–47.0)
HEMOGLOBIN: 13.2 g/dL (ref 12.0–16.0)
MCH: 30.4 pg (ref 26.0–34.0)
MCHC: 33.6 g/dL (ref 32.0–36.0)
MCV: 90.6 fL (ref 80.0–100.0)
Platelets: 181 10*3/uL (ref 150–440)
RBC: 4.34 MIL/uL (ref 3.80–5.20)
RDW: 15.5 % — ABNORMAL HIGH (ref 11.5–14.5)
WBC: 7.3 10*3/uL (ref 3.6–11.0)

## 2015-05-18 LAB — TROPONIN I: TROPONIN I: 0.04 ng/mL — AB (ref <0.031)

## 2015-05-18 LAB — BASIC METABOLIC PANEL
ANION GAP: 12 (ref 5–15)
BUN: 41 mg/dL — ABNORMAL HIGH (ref 6–20)
CALCIUM: 8.9 mg/dL (ref 8.9–10.3)
CO2: 34 mmol/L — ABNORMAL HIGH (ref 22–32)
Chloride: 97 mmol/L — ABNORMAL LOW (ref 101–111)
Creatinine, Ser: 1.41 mg/dL — ABNORMAL HIGH (ref 0.44–1.00)
GFR, EST AFRICAN AMERICAN: 37 mL/min — AB (ref >60)
GFR, EST NON AFRICAN AMERICAN: 32 mL/min — AB (ref >60)
Glucose, Bld: 125 mg/dL — ABNORMAL HIGH (ref 65–99)
Potassium: 3.7 mmol/L (ref 3.5–5.1)
SODIUM: 143 mmol/L (ref 135–145)

## 2015-05-18 LAB — GLUCOSE, CAPILLARY: GLUCOSE-CAPILLARY: 175 mg/dL — AB (ref 65–99)

## 2015-05-18 MED ORDER — HYDROCODONE-ACETAMINOPHEN 5-325 MG PO TABS: 1.0000 | ORAL_TABLET | Freq: Four times a day (QID) | ORAL | Status: DC | PRN

## 2015-05-18 MED ORDER — HYDROMORPHONE HCL 1 MG/ML IJ SOLN
0.5000 mg | Freq: Once | INTRAMUSCULAR | Status: AC
Start: 2015-05-18 — End: 2015-05-18
  Administered 2015-05-18: 0.5 mg via INTRAVENOUS
  Filled 2015-05-18: qty 1

## 2015-05-18 MED ORDER — PROMETHAZINE HCL 25 MG/ML IJ SOLN
6.2500 mg | Freq: Once | INTRAMUSCULAR | Status: AC
  Administered 2015-05-18: 6.25 mg via INTRAVENOUS
  Filled 2015-05-18: qty 1

## 2015-05-18 MED ORDER — ONDANSETRON HCL 4 MG/2ML IJ SOLN
INTRAMUSCULAR | Status: AC
  Administered 2015-05-18: 4 mg via INTRAVENOUS
  Filled 2015-05-18: qty 2

## 2015-05-18 MED ORDER — SODIUM CHLORIDE 0.9 % IV BOLUS (SEPSIS)
500.0000 mL | Freq: Once | INTRAVENOUS | Status: AC
  Administered 2015-05-18: 500 mL via INTRAVENOUS

## 2015-05-18 MED ORDER — HYDROMORPHONE HCL 1 MG/ML IJ SOLN
1.0000 mg | Freq: Once | INTRAMUSCULAR | Status: AC
Start: 2015-05-18 — End: 2015-05-18
  Administered 2015-05-18: 1 mg via INTRAVENOUS
  Filled 2015-05-18: qty 1

## 2015-05-18 MED ORDER — PROMETHAZINE HCL 12.5 MG PO TABS: 12.5000 mg | ORAL_TABLET | Freq: Three times a day (TID) | ORAL | Status: DC | PRN

## 2015-05-18 MED ORDER — ONDANSETRON HCL 4 MG/2ML IJ SOLN
4.0000 mg | Freq: Once | INTRAMUSCULAR | Status: AC
  Administered 2015-05-18: 4 mg via INTRAVENOUS

## 2015-05-18 MED ORDER — ONDANSETRON HCL 4 MG/2ML IJ SOLN
4.0000 mg | Freq: Once | INTRAMUSCULAR | Status: AC
  Administered 2015-05-18: 4 mg via INTRAVENOUS
  Filled 2015-05-18: qty 2

## 2015-05-18 NOTE — ED Notes (Signed)
MD at bedside for eval.

## 2015-05-18 NOTE — ED Notes (Signed)
Patient transported to CT 

## 2015-05-18 NOTE — Care Management Note (Signed)
Case Management Note  Patient Details  Name: [name] MRN: [id number] Date of Birth: [birth year]  Subjective/Objective:  Spoke with patient and her daughter [name]. Patient is left hand dominant but can use both. She resides at [facility], she has a walker, bed side commode and commode lift at home.  I spoke with Darl Pikes at [facility] available on site are [facility] for CNA / personal care and [facility] for skilled needs.   [name] is aware of available services, will follow up with PCP this week. She is aware if needed PCP can order physical therapy evaluation. Patient discharged to home.               Action/Plan:   Expected Discharge Date:                  Expected Discharge Plan:     In-House Referral:     Discharge planning Services     Post Acute Care Choice:    Choice offered to:     DME Arranged:    DME Agency:     HH Arranged:    HH Agency:     Status of Service:     Medicare Important Message Given:    Date Medicare IM Given:    Medicare IM give by:    Date Additional Medicare IM Given:    Additional Medicare Important Message give by:     If discussed at Long Length of Stay Meetings, dates discussed:    Additional Comments:  Caren Macadam, RN 05/18/2015, 12:36 PM

## 2015-05-18 NOTE — Consult Note (Signed)
[facility] [place] at [facility]   PATIENT NAME: [name]    MR#:  [id number]  DATE OF BIRTH:  [birth year]  DATE OF ADMISSION:  05/18/2015  PRIMARY CARE PHYSICIAN: Danella Penton., MD   REQUESTING/REFERRING PHYSICIAN: Dr. Huel Cote- ER physician  CHIEF COMPLAINT:   Chief Complaint  Patient presents with  . Fall  . Near Syncope  . Arm Pain    HISTORY OF PRESENT ILLNESS: [name]  is a 81 y.o. female with a known history of coronary artery disease and status post bypass surgery, pulmonary artery hypertension, hypertension, diabetes, peripheral vascular disease- she lives at [facility] and walks with a walker.  today when she was in the bathroom trying to lower down her pants,She lost her balance and accidentally fell down on the floor and during this fall she happened to have fracture on the right side humerus . ER physician spoke to orthopedic doctor and he advised to give a sling on the arm, he gave injection Dilaudid. The patient for pain control and patient was noted to have hypoxia in ER and so started on oxygen supplementation, medical consult was called in to admit the patient. When I saw the patient, her daughter was also present in the room, and they agreed that patient is feeling nauseous currently because of Dilaudid, but she uses oxygen at home mainly at nighttime 2 L/m, and in daytime she is advised to use oxygen if she feels short of breath. She mentioned to me that currently she feels at her baseline from respiratory point of view, she denies any chest pain or cough, she denies the episode of her falling down and was associated with any palpitation or passing out. She denies any weakness or numbness before or after the episode. And she feels very confident and positive about going home today without getting admitted.   PAST MEDICAL HISTORY:   Past Medical History  Diagnosis Date  . Cancer   . Blood transfusion  without reported diagnosis   . CHF (congestive heart failure)   . Coronary artery disease   . Hypertension   . Shortness of breath dyspnea   . Diabetes mellitus without complication   . Peripheral vascular disease     PAST SURGICAL HISTORY:  Past Surgical History  Procedure Laterality Date  . Back surgery    . Coronary artery bypass graft    . Vascular surgery      SOCIAL HISTORY:  Social History  Substance Use Topics  . Smoking status: Never Smoker   . Smokeless tobacco: Not on file  . Alcohol Use: No    FAMILY HISTORY:  Family History  Problem Relation Age of Onset  . Diabetes Mother   . Diabetes Father     DRUG ALLERGIES:  Allergies  Allergen Reactions  . Morphine And Related Other (See Comments)    lightheaded    REVIEW OF SYSTEMS:   CONSTITUTIONAL: No fever, fatigue or weakness.  EYES: No blurred or double vision.  EARS, NOSE, AND THROAT: No tinnitus or ear pain.  RESPIRATORY: No cough, shortness of breath, wheezing or hemoptysis.  CARDIOVASCULAR: No chest pain, orthopnea, edema.  GASTROINTESTINAL: No nausea, vomiting, diarrhea or abdominal pain.  GENITOURINARY: No dysuria, hematuria.  ENDOCRINE: No polyuria, nocturia,  HEMATOLOGY: No anemia, easy bruising or bleeding SKIN: No rash or lesion. MUSCULOSKELETAL: No joint pain or arthritis. Pain in right arm due to fracture.   NEUROLOGIC: No tingling, numbness, weakness.  PSYCHIATRY: No  anxiety or depression.   MEDICATIONS AT HOME:  Prior to Admission medications   Not on File      PHYSICAL EXAMINATION:   VITAL SIGNS: Blood pressure 137/94, pulse 65, temperature 98 F (36.7 C), temperature source Oral, resp. rate 24, height 4\' 11"  (1.499 m), weight 92.534 kg (204 lb), SpO2 98 %.  GENERAL:  81 y.o.-year-old , obase patient lying in the bed with no acute distress.  EYES: Pupils equal, round, reactive to light and accommodation. No scleral icterus. Extraocular muscles intact.  HEENT: Head atraumatic,  normocephalic. Oropharynx and nasopharynx clear.  NECK:  Supple, no jugular venous distention. No thyroid enlargement, no tenderness.  LUNGS: Normal breath sounds bilaterally, no wheezing, rales,rhonchi or crepitation. No use of accessory muscles of respiration.  CARDIOVASCULAR: S1, S2 normal. No murmurs, rubs, or gallops.  ABDOMEN: Soft, nontender, nondistended. Bowel sounds present. No organomegaly or mass.  EXTREMITIES: No pedal edema, cyanosis, or clubbing. Right arm in sling. NEUROLOGIC: Cranial nerves II through XII are intact. Muscle strength 5/5 in all extremities,  Except right upper- which is hurting- but she is able to move wrist and fingers. Sensation intact. Gait not checked.  PSYCHIATRIC: The patient is alert and oriented x 3.  SKIN: No obvious rash, lesion, or ulcer.   LABORATORY PANEL:   CBC  Recent Labs Lab 05/18/15 0653  WBC 7.3  HGB 13.2  HCT 39.4  PLT 181  MCV 90.6  MCH 30.4  MCHC 33.6  RDW 15.5*   ------------------------------------------------------------------------------------------------------------------  Chemistries   Recent Labs Lab 05/18/15 0653  NA 143  K 3.7  CL 97*  CO2 34*  GLUCOSE 125*  BUN 41*  CREATININE 1.41*  CALCIUM 8.9   ------------------------------------------------------------------------------------------------------------------ estimated creatinine clearance is 27.4 mL/min (by C-G formula based on Cr of 1.41). ------------------------------------------------------------------------------------------------------------------ No results for input(s): TSH, T4TOTAL, T3FREE, THYROIDAB in the last 72 hours.  Invalid input(s): FREET3   Coagulation profile No results for input(s): INR, PROTIME in the last 168 hours. ------------------------------------------------------------------------------------------------------------------- No results for input(s): DDIMER in the last 72  hours. -------------------------------------------------------------------------------------------------------------------  Cardiac Enzymes  Recent Labs Lab 05/18/15 0653  TROPONINI 0.04*   ------------------------------------------------------------------------------------------------------------------ Invalid input(s): POCBNP  ---------------------------------------------------------------------------------------------------------------  Urinalysis No results found for: COLORURINE, APPEARANCEUR, LABSPEC, PHURINE, GLUCOSEU, HGBUR, BILIRUBINUR, KETONESUR, PROTEINUR, UROBILINOGEN, NITRITE, LEUKOCYTESUR   RADIOLOGY: Dg Chest 1 View  05/18/2015   CLINICAL DATA:  Pelvis morning. CHF. Nonsmoker. History of cancer.  EXAM: CHEST  1 VIEW  COMPARISON:  08/21/2008  FINDINGS: Status post median sternotomy and CABG. Heart is enlarged. There are no focal consolidations. No pleural effusions. No pulmonary edema. Mildly prominent interstitial markings appear generally stable over prior studies. Chronic changes in both shoulders consistent with chronic rotator cuff injuries.  IMPRESSION: Cardiomegaly.  No edema.   Electronically Signed   By: Norva Pavlov M.D.   On: 05/18/2015 08:03   Ct Head Wo Contrast  05/18/2015   CLINICAL DATA:  Larey Seat this morning in her bathroom while attempting to use restroom. Often gets lightheaded. Uses a walker.  EXAM: CT HEAD WITHOUT CONTRAST  TECHNIQUE: Contiguous axial images were obtained from the base of the skull through the vertex without intravenous contrast.  COMPARISON:  None.  FINDINGS: There is mild periventricular white matter change consistent with small vessel disease. There is no intra or extra-axial fluid collection or mass lesion. The basilar cisterns and ventricles have a normal appearance. There is no CT evidence for acute infarction or hemorrhage. Bone windows show no acute abnormality. There is atherosclerotic  calcification of the internal carotid arteries.   IMPRESSION: 1.  No evidence for acute intracranial abnormality. 2. Chronic ischemic white matter changes.   Electronically Signed   By: Norva Pavlov M.D.   On: 05/18/2015 08:06   Dg Humerus Right  05/18/2015   CLINICAL DATA:  Larey Seat and landed on the right arm. Pain in the distal and of the humerus. No previous surgeries.  EXAM: RIGHT HUMERUS - 2+ VIEW  COMPARISON:  None.  FINDINGS: There is an oblique comminuted fracture of the distal right humerus proximal to the elbow. There is medial angulation at the fracture site. Significant large osteophytes are identified on the humeral head, associated with high-riding shoulder and loss of the subacromial space. Findings are consistent with chronic rotator cuff injury.  IMPRESSION: Comminuted oblique fracture of the distal humerus associated with angulation of the fracture site.  Chronic rotator cuff injury with significant associated degenerative change.   Electronically Signed   By: Norva Pavlov M.D.   On: 05/18/2015 07:59    EKG: NSR  IMPRESSION AND PLAN:  * Right humerus fracture  I spoke to orthopedic Dr. Joice Lofts- he agreed with the management with sling and pain management.  Advised to follow-up in the office if patient is being discharged .  We might need to arrange for home health for physical therapy or some kind of support in her day-to-day life with this new restriction on her physical ability   patient of Percocet for pain management.  * Hypoxia  I had a detailed discussion with patient and her daughter in the room, though he said this is chronic problem for her and she was sent to [facility] for this- was found to have pulmonary hypertension- advised to have home oxygen at nighttime, and as needed in the daytime. Currently she does not feel any different from respiratory point of view than her baseline.  She is slightly distressed because she is having nausea due to IV Dilaudid.  No further intervention for admission needed for this  issue.  * History of coronary artery disease, hypertension, diabetes  I advised to continue same home medication as she was taking before.   the patient can be discharged home by spoke with the case manager in ER to help arranging her home health for physical therapy at her living place at [facility]. This plan discussed with patient and her daughter and ER physician and they're all are in agreement. Advised to follow with orthopedic doctor in clinic in 1 week.   All the records are reviewed and case discussed with ED provider. Management plans discussed with the patient, family and they are in agreement.  CODE STATUS: Advance Directive Documentation        Most Recent Value   Type of Advance Directive  Living will, Out of facility DNR (pink MOST or yellow form), Healthcare Power of Attorney   Pre-existing out of facility DNR order (yellow form or pink MOST form)     "MOST" Form in Place?         TOTAL TIME TAKING CARE OF THIS PATIENT: 50 minutes.    Altamese Dilling M.D on 05/18/2015   Between 7am to 6pm - Pager - [phone]  After 6pm go to [email or web address] - password [email or web address] [facility]  [facility]  Office  [phone]  CC: Primary care physician; Danella Penton., MD

## 2015-05-18 NOTE — Discharge Instructions (Signed)
Cast or Splint Care Casts and splints support injured limbs and keep bones from moving while they heal.  HOME CARE  Keep the cast or splint uncovered during the drying period.  A plaster cast can take 24 to 48 hours to dry.  A fiberglass cast will dry in less than 1 hour.  Do not rest the cast on anything harder than a pillow for 24 hours.  Do not put weight on your injured limb. Do not put pressure on the cast. Wait for your doctor's approval.  Keep the cast or splint dry.  Cover the cast or splint with a plastic bag during baths or wet weather.  If you have a cast over your chest and belly (trunk), take sponge baths until the cast is taken off.  If your cast gets wet, dry it with a towel or blow dryer. Use the cool setting on the blow dryer.  Keep your cast or splint clean. Wash a dirty cast with a damp cloth.  Do not put any objects under your cast or splint.  Do not scratch the skin under the cast with an object. If itching is a problem, use a blow dryer on a cool setting over the itchy area.  Do not trim or cut your cast.  Do not take out the padding from inside your cast.  Exercise your joints near the cast as told by your doctor.  Raise (elevate) your injured limb on 1 or 2 pillows for the first 1 to 3 days. GET HELP IF:  Your cast or splint cracks.  Your cast or splint is too tight or too loose.  You itch badly under the cast.  Your cast gets wet or has a soft spot.  You have a bad smell coming from the cast.  You get an object stuck under the cast.  Your skin around the cast becomes red or sore.  You have new or more pain after the cast is put on. GET HELP RIGHT AWAY IF:  You have fluid leaking through the cast.  You cannot move your fingers or toes.  Your fingers or toes turn blue or white or are cool, painful, or puffy (swollen).  You have tingling or lose feeling (numbness) around the injured area.  You have bad pain or pressure under the  cast.  You have trouble breathing or have shortness of breath.  You have chest pain. Document Released: 12/30/2010 Document Revised: 05/02/2013 Document Reviewed: 03/08/2013 [facility] Patient Information 2015 [place], [place]. This information is not intended to replace advice given to you by your health care provider. Make sure you discuss any questions you have with your health care provider.  Humerus Fracture, Treated with Immobilization The humerus is the large bone in your upper arm. You have a broken (fractured) humerus. These fractures are easily diagnosed with X-rays. TREATMENT  Simple fractures which will heal without disability are treated with simple immobilization. Immobilization means you will wear a cast, splint, or sling. You have a fracture which will do well with immobilization. The fracture will heal well simply by being held in a good position until it is stable enough to begin range of motion exercises. Do not take part in activities which would further injure your arm.  HOME CARE INSTRUCTIONS   Put ice on the injured area.  Put ice in a plastic bag.  Place a towel between your skin and the bag.  Leave the ice on for 15-20 minutes, 03-04 times a day.  If  you have a cast:  Do not scratch the skin under the cast using sharp or pointed objects.  Check the skin around the cast every day. You may put lotion on any red or sore areas.  Keep your cast dry and clean.  If you have a splint:  Wear the splint as directed.  Keep your splint dry and clean.  You may loosen the elastic around the splint if your fingers become numb, tingle, or turn cold or blue.  If you have a sling:  Wear the sling as directed.  Do not put pressure on any part of your cast or splint until it is fully hardened.  Your cast or splint can be protected during bathing with a plastic bag. Do not lower the cast or splint into water.  Only take over-the-counter or prescription medicines for  pain, discomfort, or fever as directed by your caregiver.  Do range of motion exercises as instructed by your caregiver.  Follow up as directed by your caregiver. This is very important in order to avoid permanent injury or disability and chronic pain. SEEK IMMEDIATE MEDICAL CARE IF:   Your skin or nails in the injured arm turn blue or gray.  Your arm feels cold or numb.  You develop severe pain in the injured arm.  You are having problems with the medicines you were given. MAKE SURE YOU:   Understand these instructions.  Will watch your condition.  Will get help right away if you are not doing well or get worse. Document Released: 12/06/2000 Document Revised: 11/22/2011 Document Reviewed: 10/14/2010 [facility] Patient Information 2015 [place], [place]. This information is not intended to replace advice given to you by your health care provider. Make sure you discuss any questions you have with your health care provider.   Please return immediately if condition worsens. Please contact her primary physician or the physician you were given for referral. If you have any specialist physicians involved in her treatment and plan please also contact them. Thank you for using [facility]. Please check periodically for neurovascular status. This entails examining the hand to make sure there is good blood supply and is warm to the touch and also that the patient can grasp your fingers. Please call the orthopedic doctor in 2 days for follow-up as directed.

## 2015-05-18 NOTE — ED Notes (Signed)
Pt arrives via EMS from home [facility]). Pt c/o syncopal episode while walking to bathroom with walker and fell on right side. Pt c/o right arm pain. No deformity noted. (+) PMS. NAD noted. RR even and nonlabored. Pt AAOx4.

## 2015-05-18 NOTE — ED Notes (Signed)
Pt reports sudden onset of nausea after administration of pain medication. MD notified.

## 2015-05-18 NOTE — ED Notes (Signed)
Pt placed on 2L via nasal canula due to continuously dropping oxygen stats. Oxygen at 85% on RA.

## 2015-05-18 NOTE — ED Notes (Signed)
Pt reports continuous vomiting and dry heaves since administration of Dilauded.

## 2015-05-18 NOTE — ED Notes (Signed)
Report given to Shannon, RN

## 2015-05-18 NOTE — ED Provider Notes (Signed)
Time Seen: Approximately ----------------------------------------- 7:04 AM on 05/18/2015 -----------------------------------------    I have reviewed the triage notes  Chief Complaint: Fall; Near Syncope; and Arm Pain   History of Present Illness: [name] is a 81 y.o. female who presents after a fall today. Patient states she got out of bed to go the bathroom and felt very lightheaded and was aware of when she hit the floor. States she lost her balance and her walker. She didn't strike her head and had his short term nosebleed and is on blood thinners. She denies any neck pain, nausea or vomiting. Most of her pain is located in the right humerus region. She denies any chest pain or shortness of breath or heart palpitations. She denies any hip discomfort though has abrasions on both knees with bruises but is able to bear weight and flex her knees.   Past Medical History  Diagnosis Date  . Cancer   . Blood transfusion without reported diagnosis   . CHF (congestive heart failure)     There are no active problems to display for this patient.  degenerative joint disease Diabetes mellitus   Bypass surgery History reviewed. No pertinent past surgical history.  No current outpatient prescriptions on file.  Allergies:  Morphine and related  Family History: No family history on file.  Social History: Social History  Substance Use Topics  . Smoking status: Never Smoker   . Smokeless tobacco: None  . Alcohol Use: No     Review of Systems:   10 point review of systems was performed and was otherwise negative:  Constitutional: No fever Eyes: No visual disturbances ENT: No sore throat, ear pain Cardiac: No chest pain Respiratory: No shortness of breath, wheezing, or stridor Abdomen: No abdominal pain, no vomiting, No diarrhea Endocrine: No weight loss, No night sweats Extremities: No peripheral edema, cyanosis Skin: No rashes, easy bruising Neurologic: No focal  weakness, trouble with speech or swollowing Urologic: No dysuria, Hematuria, or urinary frequency   Physical Exam:  ED Triage Vitals  Enc Vitals Group     BP 05/18/15 0640 162/110 mmHg     Pulse Rate 05/18/15 0640 88     Resp 05/18/15 0640 18     Temp 05/18/15 0640 98 F (36.7 C)     Temp Source 05/18/15 0640 Oral     SpO2 05/18/15 0640 96 %     Weight 05/18/15 0640 204 lb (92.534 kg)     Height 05/18/15 0640  (1.499 m)     Head Cir --      Peak Flow --      Pain Score 05/18/15 0641 8     Pain Loc --      Pain Edu? --      Excl. in GC? --     General: Awake , Alert , and Oriented times 3; GCS 15 Head: Normal cephalic , atraumatic. Some mild blood from the left nares dried however the nose itself appears to be stable at this point. no crepitus or signs of fracture midface is stable. Eyes: Pupils equal , round, reactive to light Nose/Throat: No nasal drainage, patent upper airway without erythema or exudate.  Neck: Supple, Full range of motion, No anterior adenopathy or palpable thyroid masses Lungs: Clear to ascultation without wheezes , rhonchi, or rales Heart: Regular rate, regular rhythm without murmurs , gallops , or rubs Abdomen: Soft, non tender without rebound, guarding , or rigidity; bowel sounds positive and symmetric in all  4 quadrants. No organomegaly .        Extremities: Right upper extremity is in a splint but she is able to wiggle her fingers and I left her in a splint for x-ray evaluation. She has bruises and superficial abrasions on the anterior surface of both knees with some mild surrounding ecchymosis without any crepitus or step off noted she has full range of motion and was able to bear weight prior to arrival. No bony deformities. Both hips show good inversion and eversion without any reproduction of pain there is no shortening of the limbs all extremities are neurovascularly intact. Neurologic: normal ambulation, Motor symmetric without deficits, sensory  intact Skin: warm, dry, no rashes   Labs:   All laboratory work was reviewed including any pertinent negatives or positives listed below:  Labs Reviewed  BASIC METABOLIC PANEL  CBC  URINALYSIS COMPLETEWITH MICROSCOPIC ([facility] ONLY)  TROPONIN I  CBG MONITORING, ED   troponin level was only slightly elevated at 0.04 and I felt was not of clinical significance is patient has some minor renal insufficiency She was given IV fluids while here in emergency department EKG: ED ECG REPORT I, Jennye Moccasin, the attending physician, personally viewed and interpreted this ECG.  Date: 05/18/2015 EKG Time: 642 Rate: 82 Rhythm: normal sinus rhythm QRS Axis: normal Intervals: normal ST/T Wave abnormalities: Nonspecific ST and T wave abnormality Conduction Disutrbances: none Narrative Interpretation: Poor quality EKG No obvious acute ischemic changes are noted    Radiology:     : RIGHT HUMERUS - 2+ VIEW  COMPARISON: None.  FINDINGS: There is an oblique comminuted fracture of the distal right humerus proximal to the elbow. There is medial angulation at the fracture site. Significant large osteophytes are identified on the humeral head, associated with high-riding shoulder and loss of the subacromial space. Findings are consistent with chronic rotator cuff injury.  IMPRESSION: Comminuted oblique fracture of the distal humerus associated with angulation of the fracture site.  Chronic rotator cuff injury with significant associated degenerative change.    EXAM: CHEST 1 VIEW  COMPARISON: 08/21/2008  FINDINGS: Status post median sternotomy and CABG. Heart is enlarged. There are no focal consolidations. No pleural effusions. No pulmonary edema. Mildly prominent interstitial markings appear generally stable over prior studies. Chronic changes in both shoulders consistent with chronic rotator cuff injuries.  IMPRESSION: Cardiomegaly. No edema.   Electronically  Signed By: Norva Pavlov M.D. On: 05/18/2015 08:03          CT Head Wo Contrast (Final result) Result time: 05/18/15 08:06:48   Final result by Rad Results In Interface (05/18/15 08:06:48)   Narrative:   CLINICAL DATA: Larey Seat this morning in her bathroom while attempting to use restroom. Often gets lightheaded. Uses a walker.  EXAM: CT HEAD WITHOUT CONTRAST  TECHNIQUE: Contiguous axial images were obtained from the base of the skull through the vertex without intravenous contrast.  COMPARISON: None.  FINDINGS: There is mild periventricular white matter change consistent with small vessel disease. There is no intra or extra-axial fluid collection or mass lesion. The basilar cisterns and ventricles have a normal appearance. There is no CT evidence for acute infarction or hemorrhage. Bone windows show no acute abnormality. There is atherosclerotic calcification of the internal carotid arteries.  IMPRESSION: 1. No evidence for acute intracranial abnormality. 2. Chronic ischemic white matter changes.           I personally reviewed the radiologic studies   Procedures: Patient had a splint applied to right  upper extremity by the technician which was reexamined by myself. He is to be stable and the patient has good neurovascular function     ED Course: Patient's stay here was overall uneventful she had some nausea and vomiting after receiving IV Dilaudid for pain. This was corrected with IV Zofran followed by IV Phenergan. She'll be discharged on pain medication along with anti-medic therapy. I spoke to orthopedics unassigned we agree with outpatient management. I had the hospitalist see the patient mainly due to the near syncope versus syncopal episode. They felt the patient could be treated on an outpatient basis and discharge was arranged with the patient's retirement home along with her daughter being here and present states she can take care of her for  the next couple of days.    Assessment: Status post fall with right distal spiral closed humerus fracture Near syncope versus syncopal episode     Plan: Discharge back to her retirement home. She should follow up with orthopedics along with her primary physician happens to have an appointment on Tuesday. He turned here to emergency department for any other new concerns.          Jennye Moccasin, MD 05/18/15 [phone]

## 2015-05-18 NOTE — ED Notes (Addendum)
Pt reporting continued pain. MD notified. No orders placed at this time.

## 2015-05-18 NOTE — ED Notes (Signed)
pts blood glucose is 175 per fingerstick.

## 2015-05-26 ENCOUNTER — Encounter: Payer: Self-pay | Admitting: *Deleted

## 2015-05-26 ENCOUNTER — Inpatient Hospital Stay
Admission: AD | Admit: 2015-05-26 | Discharge: 2015-05-31 | DRG: 303 | Disposition: A | Discharge status: Skilled Nursing Facility | Payer: Medicare Other | Source: Ambulatory Visit | Attending: Vascular Surgery | Admitting: Vascular Surgery

## 2015-05-26 DIAGNOSIS — Z95828 Presence of other vascular implants and grafts: Secondary | ICD-10-CM

## 2015-05-26 DIAGNOSIS — E1122 Type 2 diabetes mellitus with diabetic chronic kidney disease: Secondary | ICD-10-CM | POA: Diagnosis present

## 2015-05-26 DIAGNOSIS — I5022 Chronic systolic (congestive) heart failure: Secondary | ICD-10-CM | POA: Diagnosis present

## 2015-05-26 DIAGNOSIS — Z79899 Other long term (current) drug therapy: Secondary | ICD-10-CM | POA: Diagnosis not present

## 2015-05-26 DIAGNOSIS — Z9889 Other specified postprocedural states: Secondary | ICD-10-CM | POA: Diagnosis not present

## 2015-05-26 DIAGNOSIS — I998 Other disorder of circulatory system: Secondary | ICD-10-CM | POA: Diagnosis present

## 2015-05-26 DIAGNOSIS — E1151 Type 2 diabetes mellitus with diabetic peripheral angiopathy without gangrene: Secondary | ICD-10-CM | POA: Diagnosis present

## 2015-05-26 DIAGNOSIS — Z833 Family history of diabetes mellitus: Secondary | ICD-10-CM | POA: Diagnosis not present

## 2015-05-26 DIAGNOSIS — I2510 Atherosclerotic heart disease of native coronary artery without angina pectoris: Secondary | ICD-10-CM | POA: Diagnosis present

## 2015-05-26 DIAGNOSIS — N183 Chronic kidney disease, stage 3 (moderate): Secondary | ICD-10-CM | POA: Diagnosis present

## 2015-05-26 DIAGNOSIS — Z888 Allergy status to other drugs, medicaments and biological substances status: Secondary | ICD-10-CM | POA: Diagnosis not present

## 2015-05-26 DIAGNOSIS — Z79891 Long term (current) use of opiate analgesic: Secondary | ICD-10-CM | POA: Diagnosis not present

## 2015-05-26 DIAGNOSIS — S42301A Unspecified fracture of shaft of humerus, right arm, initial encounter for closed fracture: Secondary | ICD-10-CM | POA: Diagnosis present

## 2015-05-26 DIAGNOSIS — E860 Dehydration: Secondary | ICD-10-CM | POA: Diagnosis present

## 2015-05-26 DIAGNOSIS — J029 Acute pharyngitis, unspecified: Secondary | ICD-10-CM | POA: Diagnosis present

## 2015-05-26 DIAGNOSIS — I776 Arteritis, unspecified: Secondary | ICD-10-CM | POA: Diagnosis present

## 2015-05-26 DIAGNOSIS — D638 Anemia in other chronic diseases classified elsewhere: Secondary | ICD-10-CM | POA: Diagnosis present

## 2015-05-26 DIAGNOSIS — I739 Peripheral vascular disease, unspecified: Secondary | ICD-10-CM | POA: Insufficient documentation

## 2015-05-26 DIAGNOSIS — L03115 Cellulitis of right lower limb: Secondary | ICD-10-CM | POA: Diagnosis present

## 2015-05-26 DIAGNOSIS — Z859 Personal history of malignant neoplasm, unspecified: Secondary | ICD-10-CM | POA: Diagnosis not present

## 2015-05-26 DIAGNOSIS — M109 Gout, unspecified: Secondary | ICD-10-CM | POA: Insufficient documentation

## 2015-05-26 DIAGNOSIS — Z951 Presence of aortocoronary bypass graft: Secondary | ICD-10-CM

## 2015-05-26 DIAGNOSIS — N179 Acute kidney failure, unspecified: Secondary | ICD-10-CM | POA: Diagnosis present

## 2015-05-26 DIAGNOSIS — Z794 Long term (current) use of insulin: Secondary | ICD-10-CM | POA: Diagnosis not present

## 2015-05-26 DIAGNOSIS — W19XXXA Unspecified fall, initial encounter: Secondary | ICD-10-CM | POA: Diagnosis present

## 2015-05-26 DIAGNOSIS — I350 Nonrheumatic aortic (valve) stenosis: Secondary | ICD-10-CM | POA: Insufficient documentation

## 2015-05-26 DIAGNOSIS — M25519 Pain in unspecified shoulder: Secondary | ICD-10-CM | POA: Diagnosis present

## 2015-05-26 DIAGNOSIS — I129 Hypertensive chronic kidney disease with stage 1 through stage 4 chronic kidney disease, or unspecified chronic kidney disease: Secondary | ICD-10-CM | POA: Diagnosis present

## 2015-05-26 DIAGNOSIS — Z8679 Personal history of other diseases of the circulatory system: Secondary | ICD-10-CM | POA: Insufficient documentation

## 2015-05-26 LAB — COMPREHENSIVE METABOLIC PANEL
ALT: 13 U/L — ABNORMAL LOW (ref 14–54)
AST: 23 U/L (ref 15–41)
Albumin: 2.9 g/dL — ABNORMAL LOW (ref 3.5–5.0)
Alkaline Phosphatase: 50 U/L (ref 38–126)
Anion gap: 12 (ref 5–15)
BUN: 45 mg/dL — ABNORMAL HIGH (ref 6–20)
CHLORIDE: 92 mmol/L — AB (ref 101–111)
CO2: 34 mmol/L — ABNORMAL HIGH (ref 22–32)
Calcium: 8.4 mg/dL — ABNORMAL LOW (ref 8.9–10.3)
Creatinine, Ser: 1.4 mg/dL — ABNORMAL HIGH (ref 0.44–1.00)
GFR, EST AFRICAN AMERICAN: 38 mL/min — AB (ref >60)
GFR, EST NON AFRICAN AMERICAN: 32 mL/min — AB (ref >60)
Glucose, Bld: 142 mg/dL — ABNORMAL HIGH (ref 65–99)
POTASSIUM: 3.4 mmol/L — AB (ref 3.5–5.1)
Sodium: 138 mmol/L (ref 135–145)
Total Bilirubin: 1 mg/dL (ref 0.3–1.2)
Total Protein: 6.4 g/dL — ABNORMAL LOW (ref 6.5–8.1)

## 2015-05-26 LAB — CBC
HCT: 32.7 % — ABNORMAL LOW (ref 35.0–47.0)
HEMOGLOBIN: 10.8 g/dL — AB (ref 12.0–16.0)
MCH: 30 pg (ref 26.0–34.0)
MCHC: 33.2 g/dL (ref 32.0–36.0)
MCV: 90.4 fL (ref 80.0–100.0)
Platelets: 259 10*3/uL (ref 150–440)
RBC: 3.61 MIL/uL — AB (ref 3.80–5.20)
RDW: 15.4 % — ABNORMAL HIGH (ref 11.5–14.5)
WBC: 10.8 10*3/uL (ref 3.6–11.0)

## 2015-05-26 LAB — MAGNESIUM: MAGNESIUM: 2.5 mg/dL — AB (ref 1.7–2.4)

## 2015-05-26 LAB — PROTIME-INR
INR: 1.09
PROTHROMBIN TIME: 14.3 s (ref 11.4–15.0)

## 2015-05-26 LAB — GLUCOSE, CAPILLARY
GLUCOSE-CAPILLARY: 147 mg/dL — AB (ref 65–99)
GLUCOSE-CAPILLARY: 147 mg/dL — AB (ref 65–99)
GLUCOSE-CAPILLARY: 196 mg/dL — AB (ref 65–99)

## 2015-05-26 LAB — PHOSPHORUS: PHOSPHORUS: 3.5 mg/dL (ref 2.5–4.6)

## 2015-05-26 LAB — ABO/RH: ABO/RH(D): A POS

## 2015-05-26 LAB — APTT: APTT: 29 s (ref 24–36)

## 2015-05-26 MED ORDER — OXYCODONE-ACETAMINOPHEN 5-325 MG PO TABS
2.0000 | ORAL_TABLET | ORAL | Status: DC | PRN
  Administered 2015-05-26 – 2015-05-29 (×4): 2 via ORAL
  Filled 2015-05-26 (×5): qty 2

## 2015-05-26 MED ORDER — PANTOPRAZOLE SODIUM 40 MG PO TBEC
40.0000 mg | DELAYED_RELEASE_TABLET | Freq: Every day | ORAL | Status: DC
  Administered 2015-05-26 – 2015-05-27 (×2): 40 mg via ORAL
  Filled 2015-05-26 (×2): qty 1

## 2015-05-26 MED ORDER — DEXTROSE 5 % IV SOLN
1.0000 g | INTRAVENOUS | Status: DC
  Administered 2015-05-26 – 2015-05-30 (×4): 1 g via INTRAVENOUS
  Filled 2015-05-26 (×8): qty 10

## 2015-05-26 MED ORDER — NITROGLYCERIN 0.4 MG/HR TD PT24
0.4000 mg | MEDICATED_PATCH | Freq: Every day | TRANSDERMAL | Status: DC
  Filled 2015-05-26: qty 1

## 2015-05-26 MED ORDER — TORSEMIDE 20 MG PO TABS
20.0000 mg | ORAL_TABLET | Freq: Every day | ORAL | Status: DC
  Administered 2015-05-27 – 2015-05-29 (×2): 20 mg via ORAL
  Filled 2015-05-26 (×3): qty 1

## 2015-05-26 MED ORDER — POTASSIUM CHLORIDE CRYS ER 20 MEQ PO TBCR
40.0000 meq | EXTENDED_RELEASE_TABLET | Freq: Every day | ORAL | Status: DC
  Administered 2015-05-26: 40 meq via ORAL
  Filled 2015-05-26: qty 2

## 2015-05-26 MED ORDER — HEPARIN (PORCINE) IN NACL 100-0.45 UNIT/ML-% IJ SOLN
1250.0000 [IU]/h | INTRAMUSCULAR | Status: DC
  Administered 2015-05-26 – 2015-05-27 (×2): 1400 [IU]/h via INTRAVENOUS
  Administered 2015-05-28: 1350 [IU]/h via INTRAVENOUS
  Filled 2015-05-26 (×7): qty 250

## 2015-05-26 MED ORDER — INSULIN ASPART 100 UNIT/ML ~~LOC~~ SOLN: 30.0000 [IU] | Freq: Three times a day (TID) | SUBCUTANEOUS | Status: DC

## 2015-05-26 MED ORDER — INSULIN ASPART 100 UNIT/ML ~~LOC~~ SOLN: 40.0000 [IU] | Freq: Three times a day (TID) | SUBCUTANEOUS | Status: DC

## 2015-05-26 MED ORDER — HYDROCHLOROTHIAZIDE 50 MG PO TABS: 50.0000 mg | ORAL_TABLET | Freq: Every day | ORAL | Status: DC

## 2015-05-26 MED ORDER — ALLOPURINOL 100 MG PO TABS
200.0000 mg | ORAL_TABLET | Freq: Every day | ORAL | Status: DC
  Administered 2015-05-29: 200 mg via ORAL
  Filled 2015-05-26 (×2): qty 2

## 2015-05-26 MED ORDER — INSULIN ASPART 100 UNIT/ML ~~LOC~~ SOLN
0.0000 [IU] | SUBCUTANEOUS | Status: DC
  Administered 2015-05-26: 4 [IU] via SUBCUTANEOUS
  Administered 2015-05-27: 12 [IU] via SUBCUTANEOUS
  Administered 2015-05-27 – 2015-05-28 (×3): 2 [IU] via SUBCUTANEOUS
  Administered 2015-05-28: 12 [IU] via SUBCUTANEOUS
  Administered 2015-05-28: 2 [IU] via SUBCUTANEOUS
  Administered 2015-05-29: 4 [IU] via SUBCUTANEOUS
  Administered 2015-05-29 – 2015-05-31 (×3): 2 [IU] via SUBCUTANEOUS
  Filled 2015-05-26: qty 2
  Filled 2015-05-26: qty 4
  Filled 2015-05-26 (×4): qty 2
  Filled 2015-05-26: qty 12
  Filled 2015-05-26: qty 8
  Filled 2015-05-26: qty 12
  Filled 2015-05-26: qty 2
  Filled 2015-05-26: qty 4
  Filled 2015-05-26: qty 2

## 2015-05-26 MED ORDER — INSULIN GLARGINE 100 UNIT/ML ~~LOC~~ SOLN
60.0000 [IU] | Freq: Every day | SUBCUTANEOUS | Status: DC
  Administered 2015-05-26: 30 [IU] via SUBCUTANEOUS
  Administered 2015-05-27 – 2015-05-29 (×3): 60 [IU] via SUBCUTANEOUS
  Filled 2015-05-26 (×4): qty 0.6

## 2015-05-26 MED ORDER — BISOPROLOL-HYDROCHLOROTHIAZIDE 5-6.25 MG PO TABS
1.0000 | ORAL_TABLET | Freq: Every day | ORAL | Status: DC
  Administered 2015-05-26 – 2015-05-27 (×2): 1 via ORAL
  Filled 2015-05-26 (×4): qty 1

## 2015-05-26 MED ORDER — FAMOTIDINE 20 MG PO TABS
20.0000 mg | ORAL_TABLET | Freq: Two times a day (BID) | ORAL | Status: DC
  Administered 2015-05-27 – 2015-05-31 (×7): 20 mg via ORAL
  Filled 2015-05-26 (×10): qty 1

## 2015-05-26 MED ORDER — BISOPROLOL FUMARATE 5 MG PO TABS
5.0000 mg | ORAL_TABLET | Freq: Every day | ORAL | Status: DC
  Administered 2015-05-26 – 2015-05-31 (×4): 5 mg via ORAL
  Filled 2015-05-26 (×6): qty 1

## 2015-05-26 MED ORDER — ONDANSETRON HCL 4 MG PO TABS: 4.0000 mg | ORAL_TABLET | Freq: Four times a day (QID) | ORAL | Status: DC | PRN

## 2015-05-26 MED ORDER — SODIUM CHLORIDE 0.9 % IV SOLN
INTRAVENOUS | Status: DC
  Administered 2015-05-26: 16:00:00 via INTRAVENOUS

## 2015-05-26 MED ORDER — ATORVASTATIN CALCIUM 20 MG PO TABS
20.0000 mg | ORAL_TABLET | Freq: Every day | ORAL | Status: DC
  Administered 2015-05-27 – 2015-05-29 (×3): 20 mg via ORAL
  Filled 2015-05-26 (×4): qty 1

## 2015-05-26 MED ORDER — INSULIN GLARGINE 100 UNIT/ML ~~LOC~~ SOLN
60.0000 [IU] | Freq: Every day | SUBCUTANEOUS | Status: DC
  Filled 2015-05-26: qty 0.6

## 2015-05-26 MED ORDER — INSULIN GLARGINE 100 UNIT/ML ~~LOC~~ SOLN
66.0000 [IU] | Freq: Every day | SUBCUTANEOUS | Status: DC
  Filled 2015-05-26: qty 0.66

## 2015-05-26 MED ORDER — NITROGLYCERIN 0.4 MG/HR TD PT24: 0.4000 mg | MEDICATED_PATCH | Freq: Every day | TRANSDERMAL | Status: DC | PRN

## 2015-05-26 MED ORDER — ACETAMINOPHEN 325 MG PO TABS: 650.0000 mg | ORAL_TABLET | Freq: Four times a day (QID) | ORAL | Status: DC | PRN

## 2015-05-26 MED ORDER — ESCITALOPRAM OXALATE 10 MG PO TABS
5.0000 mg | ORAL_TABLET | Freq: Every day | ORAL | Status: DC
  Administered 2015-05-29 – 2015-05-31 (×3): 5 mg via ORAL
  Filled 2015-05-26 (×4): qty 1

## 2015-05-26 MED ORDER — ACETAMINOPHEN 650 MG RE SUPP
650.0000 mg | Freq: Four times a day (QID) | RECTAL | Status: DC | PRN
Start: 2015-05-26 — End: 2015-05-31

## 2015-05-26 MED ORDER — ISOSORBIDE MONONITRATE ER 30 MG PO TB24
60.0000 mg | ORAL_TABLET | Freq: Every day | ORAL | Status: DC
  Administered 2015-05-26 – 2015-05-27 (×2): 60 mg via ORAL
  Filled 2015-05-26 (×4): qty 2

## 2015-05-26 MED ORDER — METOLAZONE 2.5 MG PO TABS
2.5000 mg | ORAL_TABLET | ORAL | Status: DC
  Filled 2015-05-26: qty 1

## 2015-05-26 MED ORDER — BISOPROLOL-HYDROCHLOROTHIAZIDE 10-6.25 MG PO TABS: 1.0000 | ORAL_TABLET | Freq: Two times a day (BID) | ORAL | Status: DC

## 2015-05-26 MED ORDER — OXYCODONE-ACETAMINOPHEN 5-325 MG PO TABS
1.0000 | ORAL_TABLET | ORAL | Status: DC | PRN
  Administered 2015-05-26 – 2015-05-31 (×4): 1 via ORAL
  Filled 2015-05-26 (×4): qty 1

## 2015-05-26 MED ORDER — LOSARTAN POTASSIUM 50 MG PO TABS
100.0000 mg | ORAL_TABLET | Freq: Every day | ORAL | Status: DC
  Administered 2015-05-26 – 2015-05-29 (×3): 100 mg via ORAL
  Filled 2015-05-26 (×4): qty 2

## 2015-05-26 MED ORDER — DOCUSATE SODIUM 100 MG PO CAPS
100.0000 mg | ORAL_CAPSULE | Freq: Two times a day (BID) | ORAL | Status: DC
  Administered 2015-05-27 – 2015-05-31 (×8): 100 mg via ORAL
  Filled 2015-05-26 (×10): qty 1

## 2015-05-26 MED ORDER — ONDANSETRON HCL 4 MG/2ML IJ SOLN
4.0000 mg | Freq: Four times a day (QID) | INTRAMUSCULAR | Status: DC | PRN
  Administered 2015-05-27: 4 mg via INTRAVENOUS

## 2015-05-26 MED ORDER — INSULIN ASPART 100 UNIT/ML ~~LOC~~ SOLN
30.0000 [IU] | Freq: Three times a day (TID) | SUBCUTANEOUS | Status: DC
  Filled 2015-05-26: qty 30

## 2015-05-26 NOTE — Care Management Note (Signed)
Case Management Note  Patient Details  Name: OLIVIANA MCGAHEE MRN: [id number] Date of Birth: Jun 29, 1925  Subjective/Objective:   Spoke to pt. And family at bedside, to address  Why she cannot take her own supply of meds.  I have explained to them that the [facility] Policy is that we have to dispense from the pharmacy. I have explained that although we used to allow it, there are many logistics that affect the meds, including verification and storage of them.  The pt. States that during her last Hospitalization, the hospital had higher costs for some meds , than she usually pays. I have explained that that is an insurance issue. The cost is determined by the hospital contract. They are fine now.                Action/Plan:   Expected Discharge Date:                  Expected Discharge Plan:     In-House Referral:     Discharge planning Services     Post Acute Care Choice:    Choice offered to:     DME Arranged:    DME Agency:     HH Arranged:    HH Agency:     Status of Service:     Medicare Important Message Given:    Date Medicare IM Given:    Medicare IM give by:    Date Additional Medicare IM Given:    Additional Medicare Important Message give by:     If discussed at Long Length of Stay Meetings, dates discussed:    Additional Comments:  Berna Bue, RN 05/26/2015, 4:03 PM

## 2015-05-26 NOTE — H&P (Signed)
[facility] Admission History & Physical  MRN : [id number]  Tracy ROSIER is a 81 y.o. (March 14, 1925) female who presents with chief complaint of right lower extremity pain.  History of Present Illness:  Patient fell about one week ago and sustained what sounds like a spiral fracture of her right humerus. Since then she has been experiencing right lower extremity pain. Describes the pain as constant in nature mostly located at the calf distally. She is also experiencing swelling and erythema to the right leg. She states the pain has progressively worsened over the last week prompting her to seek medical attention. She was seen by the nurse who works at her assisted living facility and was treated with Keflex on Friday for cellulitis - this has not improved.  The patient is s/p a right lower extremity crosser atherectomy of the SFA and popliteal artery with angioplasty of the SFA and popliteal artery on 11/12/14.   Current Facility-Administered Medications  Medication Dose Route Frequency Provider Last Rate Last Dose  . acetaminophen (TYLENOL) tablet 650 mg  650 mg Oral Q6H PRN Ranae Plumber Syrena Burges, PA-C       Or  . acetaminophen (TYLENOL) suppository 650 mg  650 mg Rectal Q6H PRN Ranae Plumber Nimra Puccinelli, PA-C      . allopurinol (ZYLOPRIM) tablet 200 mg  200 mg Oral Daily Cleola Perryman A Yuliet Needs, PA-C      . atorvastatin (LIPITOR) tablet 20 mg  20 mg Oral QHS Reynard Christoffersen A Perl Folmar, PA-C      . bisoprolol-hydrochlorothiazide (ZIAC) 5-6.25 MG per tablet 1 tablet  1 tablet Oral Daily Renford Dills, MD       And  . bisoprolol (ZEBETA) tablet 5 mg  5 mg Oral Daily Renford Dills, MD      . docusate sodium (COLACE) capsule 100 mg  100 mg Oral BID Lasharn Bufkin A Saphyra Hutt, PA-C      . escitalopram (LEXAPRO) tablet 5 mg  5 mg Oral Daily Manpreet Strey A Kathleen Tamm, PA-C      . famotidine (PEPCID) tablet 20 mg  20 mg Oral BID Anmarie Fukushima A Alycia Cooperwood, PA-C      . heparin ADULT infusion 100  units/mL (25000 units/250 mL)  1,400 Units/hr Intravenous Continuous Maridee Slape A Cherity Blickenstaff, PA-C      . hydrochlorothiazide (HYDRODIURIL) tablet 50 mg  50 mg Oral Daily Braydan Marriott A Daliana Leverett, PA-C      . insulin aspart (novoLOG) injection 0-24 Units  0-24 Units Subcutaneous 6 times per day [employer], PA-C      . insulin aspart (novoLOG) injection 40 Units  40 Units Subcutaneous TID AC Noralee Dutko A Hassaan Crite, PA-C      . insulin glargine (LANTUS) injection 66 Units  66 Units Subcutaneous QHS Alesha Jaffee A Phylis Javed, PA-C      . isosorbide mononitrate (IMDUR) 24 hr tablet 60 mg  60 mg Oral Daily Alireza Pollack A Gladis Soley, PA-C      . losartan (COZAAR) tablet 100 mg  100 mg Oral Daily Arshi Duarte A Milbern Doescher, PA-C      . nitroGLYCERIN (NITRODUR - Dosed in mg/24 hr) patch 0.4 mg  0.4 mg Transdermal Daily PRN Dorismar Chay A Jamera Vanloan, PA-C      . ondansetron (ZOFRAN) tablet 4 mg  4 mg Oral Q6H PRN Aragorn Recker A Rowene Suto, PA-C       Or  . ondansetron (ZOFRAN) injection 4 mg  4 mg Intravenous Q6H PRN Sophee Mckimmy A Allesha Aronoff, PA-C      . oxyCODONE-acetaminophen (PERCOCET/ROXICET) 5-325 MG  per tablet 1 tablet  1 tablet Oral Q4H PRN Ranae Plumber Taylore Hinde, PA-C      . oxyCODONE-acetaminophen (PERCOCET/ROXICET) 5-325 MG per tablet 2 tablet  2 tablet Oral Q4H PRN Evrett Hakim A Kelis Plasse, PA-C      . pantoprazole (PROTONIX) EC tablet 40 mg  40 mg Oral Daily Jaycion Treml A Levern Kalka, PA-C      . potassium chloride SA (K-DUR,KLOR-CON) CR tablet 40 mEq  40 mEq Oral Daily Tanijah Morais A Quintasha Gren, PA-C      . torsemide (DEMADEX) tablet 20 mg  20 mg Oral Daily Tonette Lederer, PA-C        Past Medical History  Diagnosis Date  . Cancer   . Blood transfusion without reported diagnosis   . CHF (congestive heart failure)   . Coronary artery disease   . Hypertension   . Shortness of breath dyspnea   . Diabetes mellitus without complication   . Peripheral vascular disease     Past Surgical History  Procedure Laterality Date  .  Back surgery    . Coronary artery bypass graft    . Vascular surgery      Social History Social History  Substance Use Topics  . Smoking status: Never Smoker   . Smokeless tobacco: Not on file  . Alcohol Use: No    Family History Family History  Problem Relation Age of Onset  . Diabetes Mother   . Diabetes Father     Allergies  Allergen Reactions  . Dilaudid [Hydromorphone Hcl] Nausea And Vomiting    Pt experienced severe NV and constant dry heaves with no relief from the pain.   Marland Kitchen Morphine And Related Other (See Comments)    lightheaded     REVIEW OF SYSTEMS (Negative unless checked)  Constitutional: [] Weight loss  [] Fever  [] Chills Cardiac: [] Chest pain   [] Chest pressure   [] Palpitations   [] Shortness of breath when laying flat   [] Shortness of breath at rest   [] Shortness of breath with exertion. Vascular:  [x] Pain in legs with walking   [x] Pain in legs at rest   [] Pain in legs when laying flat   [] Claudication   [x] Pain in feet when walking  [x] Pain in feet at rest  [] Pain in feet when laying flat   [] History of DVT   [] Phlebitis   [] Swelling in legs   [] Varicose veins   [] Non-healing ulcers Pulmonary:   [] Uses home oxygen   [] Productive cough   [] Hemoptysis   [] Wheeze  [] COPD   [] Asthma Neurologic:  [] Dizziness  [] Blackouts   [] Seizures   [] History of stroke   [] History of TIA  [] Aphasia   [] Temporary blindness   [] Dysphagia   [] Weakness or numbness in arms   [] Weakness or numbness in legs Musculoskeletal:  [] Arthritis   [] Joint swelling   [] Joint pain   [] Low back pain Hematologic:  [] Easy bruising  [] Easy bleeding   [] Hypercoagulable state   [] Anemic  [] Hepatitis Gastrointestinal:  [] Blood in stool   [] Vomiting blood  [] Gastroesophageal reflux/heartburn   [] Difficulty swallowing. Genitourinary:  [] Chronic kidney disease   [] Difficult urination  [] Frequent urination  [] Burning with urination   [] Blood in urine Skin:  [x] Rashes   [] Ulcers   [] Wounds Psychological:   [] History of anxiety   []  History of major depression.  Physical Examination  Filed Vitals:   05/26/15 1140  BP: 131/73  Pulse: 53  Temp: 98.5 F (36.9 C)  TempSrc: Oral  Resp: 19  SpO2: 75%   There is no weight on  file to calculate BMI.   Gen: WD/WN, NAD Head: [place]/AT, No temporalis wasting.  Ear/Nose/Throat: Hearing grossly intact, nares w/o erythema or drainage, oropharynx w/o Erythema/Exudate, Eyes: PERRLA, EOMI.  Neck: Supple, no nuchal rigidity.  No bruit or JVD.  Pulmonary:  Good air movement, clear to auscultation bilaterally, no increased work of respiration or use of accessory muscles  Cardiac: RRR, normal S1, S2, no Murmurs, rubs or gallops. Vascular:  Vessel Right Left  Radial Palpable Palpable  Ulnar Palpable Palpable  Brachial Palpable Palpable  Carotid Palpable, without bruit Palpable, without bruit  Aorta Not palpable N/A  Femoral Palpable Palpable  Popliteal Non-Palpable Palpable  PT Non-Palpable Palpable  DP Non-Palpable Palpable   Right Extremity: Erythema noted from calf distally - not cellulitic in nature. Tender to palpation. Edematous. Cold.   Gastrointestinal: soft, non-tender/non-distended. No guarding/reflex. No masses, surgical incisions, or scars. Musculoskeletal: M/S 5/5 throughout.  No deformity or atrophy. Neurologic: CN 2-12 intact. Pain and light touch intact in extremities.  Symmetrical.  Speech is fluent. Motor exam as listed above. Psychiatric: Judgment intact, Mood & affect appropriate for pt's clinical situation. Dermatologic: No rashes or ulcers noted.  No cellulitis or open wounds. Lymph : No Cervical, Axillary, or Inguinal lymphadenopathy.  CBC Lab Results  Component Value Date   WBC 7.3 05/18/2015   HGB 13.2 05/18/2015   HCT 39.4 05/18/2015   MCV 90.6 05/18/2015   PLT 181 05/18/2015    BMET    Component Value Date/Time   NA 143 05/18/2015 0653   K 3.7 05/18/2015 0653   CL 97* 05/18/2015 0653   CO2 34* 05/18/2015 0653    GLUCOSE 125* 05/18/2015 0653   BUN 41* 05/18/2015 0653   CREATININE 1.41* 05/18/2015 0653   CALCIUM 8.9 05/18/2015 0653   GFRNONAA 32* 05/18/2015 0653   GFRAA 37* 05/18/2015 0653   Estimated Creatinine Clearance: 27.4 mL/min (by C-G formula based on Cr of 1.41).  COAG No results found for: INR, PROTIME  Radiology Dg Chest 1 View  05/18/2015   CLINICAL DATA:  Pelvis morning. CHF. Nonsmoker. History of cancer.  EXAM: CHEST  1 VIEW  COMPARISON:  08/21/2008  FINDINGS: Status post median sternotomy and CABG. Heart is enlarged. There are no focal consolidations. No pleural effusions. No pulmonary edema. Mildly prominent interstitial markings appear generally stable over prior studies. Chronic changes in both shoulders consistent with chronic rotator cuff injuries.  IMPRESSION: Cardiomegaly.  No edema.   Electronically Signed   By: Norva Pavlov M.D.   On: 05/18/2015 08:03   Ct Head Wo Contrast  05/18/2015   CLINICAL DATA:  Larey Seat this morning in her bathroom while attempting to use restroom. Often gets lightheaded. Uses a walker.  EXAM: CT HEAD WITHOUT CONTRAST  TECHNIQUE: Contiguous axial images were obtained from the base of the skull through the vertex without intravenous contrast.  COMPARISON:  None.  FINDINGS: There is mild periventricular white matter change consistent with small vessel disease. There is no intra or extra-axial fluid collection or mass lesion. The basilar cisterns and ventricles have a normal appearance. There is no CT evidence for acute infarction or hemorrhage. Bone windows show no acute abnormality. There is atherosclerotic calcification of the internal carotid arteries.  IMPRESSION: 1.  No evidence for acute intracranial abnormality. 2. Chronic ischemic white matter changes.   Electronically Signed   By: Norva Pavlov M.D.   On: 05/18/2015 08:06   Dg Humerus Right  05/18/2015   CLINICAL DATA:  Larey Seat and landed on  the right arm. Pain in the distal and of the humerus. No  previous surgeries.  EXAM: RIGHT HUMERUS - 2+ VIEW  COMPARISON:  None.  FINDINGS: There is an oblique comminuted fracture of the distal right humerus proximal to the elbow. There is medial angulation at the fracture site. Significant large osteophytes are identified on the humeral head, associated with high-riding shoulder and loss of the subacromial space. Findings are consistent with chronic rotator cuff injury.  IMPRESSION: Comminuted oblique fracture of the distal humerus associated with angulation of the fracture site.  Chronic rotator cuff injury with significant associated degenerative change.   Electronically Signed   By: Norva Pavlov M.D.   On: 05/18/2015 07:59   Assessment/Plan The patient is an 81 year old female with known right lower extremity atherosclerotic disease who presents with a right cold extremity.  1) Admit to Med / Surg 2) Heparin gtt 3) Pain Control 4) Consult to medicine to assist with managing her multiple medical issues. 5) Angiogram with intervention tomorrow with Dr. Gilda Crease 6) Discussed with Dr. Romie Jumper, PA-C  05/26/2015 11:46 AM

## 2015-05-26 NOTE — Consult Note (Addendum)
[facility] - [place] at [facility]   PATIENT NAME: [name]    MR#:  [id number]  DATE OF BIRTH:  1925-08-02  DATE OF ADMISSION:  05/26/2015  PRIMARY CARE PHYSICIAN: Danella Penton., MD   REQUESTING/REFERRING PHYSICIAN: Dr. Gilda Crease  CHIEF COMPLAINT:  No chief complaint on file.  cold, tender and red right lower extremity for 4 days. Reason for consult: Medical management  HISTORY OF PRESENT ILLNESS:  [name]  is a 81 y.o. female with a known history of CAD, CHF, hypertension, diabetes, pulmonary hypertension and PVD. The patient recently had right humerus fracture. She noticed that her right lower extremity become cold, right and tender for the past 4-5 days. She is unable to walk properly. She was treated with the Keflex for cellulitis without improvement. She is admitted for right lower extremity ischemia and was a started heparin drip today. Dr. Lorretta Harp requested medical management due to multiple medical medical problems. The patient uses oxygen by nasal cannular at night when necessary.  PAST MEDICAL HISTORY:   Past Medical History  Diagnosis Date  . Cancer   . Blood transfusion without reported diagnosis   . CHF (congestive heart failure)   . Coronary artery disease   . Hypertension   . Shortness of breath dyspnea   . Diabetes mellitus without complication   . Peripheral vascular disease     PAST SURGICAL HISTOIRY:   Past Surgical History  Procedure Laterality Date  . Back surgery    . Coronary artery bypass graft    . Vascular surgery      SOCIAL HISTORY:   Social History  Substance Use Topics  . Smoking status: Never Smoker   . Smokeless tobacco: Not on file  . Alcohol Use: No    FAMILY HISTORY:   Family History  Problem Relation Age of Onset  . Diabetes Mother   . Diabetes Father     DRUG ALLERGIES:   Allergies  Allergen Reactions  . Dilaudid [Hydromorphone Hcl] Nausea And Vomiting    Pt experienced severe NV  and constant dry heaves with no relief from the pain.   Marland Kitchen Morphine And Related Other (See Comments)    lightheaded    REVIEW OF SYSTEMS:  CONSTITUTIONAL: No fever, fatigue or weakness.  EYES: No blurred or double vision.  EARS, NOSE, AND THROAT: No tinnitus or ear pain.  RESPIRATORY: No cough, NO shortness of breath, no wheezing or hemoptysis.  CARDIOVASCULAR: No chest pain, orthopnea, bilateral lower extremity edema.  GASTROINTESTINAL: No nausea, vomiting, diarrhea or abdominal pain.  GENITOURINARY: No dysuria, hematuria.  ENDOCRINE: No polyuria, nocturia,  HEMATOLOGY: No anemia, easy bruising or bleeding SKIN: No rash or lesion. MUSCULOSKELETAL: No joint pain or arthritis.   Lower extremity is cold, right and tender. NEUROLOGIC: No tingling, numbness, weakness.  PSYCHIATRY: No anxiety or depression.   MEDICATIONS AT HOME:   Prior to Admission medications   Medication Sig Start Date End Date Taking? Authorizing Provider  allopurinol (ZYLOPRIM) 100 MG tablet Take 200 mg by mouth daily. 05/14/15  Yes Historical Provider, MD  atorvastatin (LIPITOR) 40 MG tablet Take 40 mg by mouth at bedtime. Take 1/2 a pill 03/07/15  Yes Historical Provider, MD  escitalopram (LEXAPRO) 5 MG tablet Take 5 mg by mouth daily. 10/30/14  Yes Historical Provider, MD  insulin glargine (LANTUS) 100 UNIT/ML injection Inject 66 Units into the skin Nightly. 05/13/15 06/16/16 Yes Historical Provider, MD  insulin lispro (HUMALOG) 100 UNIT/ML injection Inject 40 Units into  the skin 3 (three) times daily. 05/13/15  Yes Historical Provider, MD  isosorbide mononitrate (IMDUR) 60 MG 24 hr tablet Take 60 mg by mouth daily. 12/12/14  Yes Historical Provider, MD  potassium chloride SA (K-DUR,KLOR-CON) 20 MEQ tablet Take 40 mEq by mouth daily. 04/18/15  Yes Historical Provider, MD  torsemide (DEMADEX) 20 MG tablet Take 20 mg by mouth daily. 03/07/15  Yes Historical Provider, MD  famotidine (PEPCID) 20 MG tablet Take 20 mg by mouth 2  (two) times daily.    Historical Provider, MD  HYDROcodone-acetaminophen (NORCO) 5-325 MG per tablet Take 1 tablet by mouth every 6 (six) hours as needed for moderate pain. 05/18/15   Jennye Moccasin, MD  nitroGLYCERIN (NITRODUR - DOSED IN MG/24 HR) 0.4 mg/hr patch Place 0.4 mg onto the skin every 12 (twelve) hours as needed. Place one patch onto skin as needed. Then remove for 12 hours.    Historical Provider, MD  omeprazole (PRILOSEC) 20 MG capsule Take 20 mg by mouth daily as needed.    Historical Provider, MD  promethazine (PHENERGAN) 12.5 MG tablet Take 1 tablet (12.5 mg total) by mouth every 8 (eight) hours as needed for nausea or vomiting. 05/18/15   Jennye Moccasin, MD      VITAL SIGNS:  Blood pressure 131/73, pulse 53, temperature 98.5 F (36.9 C), temperature source Oral, resp. rate 19, SpO2 98 %.  PHYSICAL EXAMINATION:  GENERAL:  81 y.o.-year-old patient lying in the bed with no acute distress. Obese. EYES: Pupils equal, round, reactive to light and accommodation. No scleral icterus. Extraocular muscles intact.  HEENT: Head atraumatic, normocephalic. Oropharynx and nasopharynx clear. Dry oral mucosa. NECK:  Supple, no jugular venous distention. No thyroid enlargement, no tenderness.  LUNGS: Normal breath sounds bilaterally, no wheezing, rales,rhonchi or crepitation. No use of accessory muscles of respiration.  CARDIOVASCULAR: S1, S2 normal. No murmurs, rubs, or gallops.  ABDOMEN: Soft, nontender, nondistended. Bowel sounds present. No organomegaly or mass.  EXTREMITIES: Bilateral lower extremity edema 1+, right leg increasing edema, tenderness in cold, no cyanosis, or clubbing. Unable to palpate bilateral pedal pulses. NEUROLOGIC: Cranial nerves II through XII are intact. Muscle strength 4/5 in all extremities. Sensation intact. Gait not checked.  PSYCHIATRIC: The patient is alert and oriented x 3.  SKIN: No obvious rash, lesion, or ulcer.   LABORATORY PANEL:   CBC  Recent  Labs Lab 05/26/15 1242  WBC 10.8  HGB 10.8*  HCT 32.7*  PLT 259   ------------------------------------------------------------------------------------------------------------------  Chemistries   Recent Labs Lab 05/26/15 1242  NA 138  K 3.4*  CL 92*  CO2 34*  GLUCOSE 142*  BUN 45*  CREATININE 1.40*  CALCIUM 8.4*  MG 2.5*  AST 23  ALT 13*  ALKPHOS 50  BILITOT 1.0   ------------------------------------------------------------------------------------------------------------------  Cardiac Enzymes No results for input(s): TROPONINI in the last 168 hours. ------------------------------------------------------------------------------------------------------------------  RADIOLOGY:  No results found.  EKG:   Orders placed or performed during the hospital encounter of 05/18/15  . EKG 12-Lead  . EKG 12-Lead  . ED EKG  . ED EKG  . EKG    IMPRESSION AND PLAN:   Right lower extremity ischemia and cellulitis CKD stage III with dehydration Hypokalemia PVD Anemia of chronic disease Hypertension Diabetes 2 Pulmonary hypertension CHF   Recommendations:  For right lower extremity ischemia, continue heparin drip and pain control, hold aspirin and Plavix, follow-up Dr. Lorretta Harp for further angiogram tomorrow. Start Rocephin for cellulitis. For CKD with dehydration, I will hold HCTZ  at this time and give gentle rehydration, follow-up BMP. Give potassium supplement and follow-up potassium and magnesium level. Continue patient's hypertension medication except HCTZ. Start Sliding scale, continue NovoLog 3 times a day and Lantus at bedtime.   All the records are reviewed and case discussed with Consulting provider. Management plans discussed with the patient, family and they are in agreement.  CODE STATUS: DO NOT RESUSCITATE  TOTAL TIME TAKING CARE OF THIS PATIENT: 56 minutes.    Shaune Pollack M.D on 05/26/2015 at 1:52 PM  Between 7am to 6pm - Pager -  [phone]  After 6pm go to [email or web address] - password [email or web address] [facility]  [facility]  Office  [phone]  CC: Primary care Physician: Danella Penton., MD

## 2015-05-27 ENCOUNTER — Encounter
Admission: AD | Disposition: A | Discharge status: Skilled Nursing Facility | Payer: Self-pay | Source: Ambulatory Visit | Attending: Vascular Surgery

## 2015-05-27 HISTORY — PX: PERIPHERAL VASCULAR CATHETERIZATION: SHX172C

## 2015-05-27 LAB — CBC
HEMATOCRIT: 28.1 % — AB (ref 35.0–47.0)
HEMOGLOBIN: 9.4 g/dL — AB (ref 12.0–16.0)
MCH: 30 pg (ref 26.0–34.0)
MCHC: 33.4 g/dL (ref 32.0–36.0)
MCV: 89.9 fL (ref 80.0–100.0)
Platelets: 231 10*3/uL (ref 150–440)
RBC: 3.12 MIL/uL — AB (ref 3.80–5.20)
RDW: 15.8 % — ABNORMAL HIGH (ref 11.5–14.5)
WBC: 10.2 10*3/uL (ref 3.6–11.0)

## 2015-05-27 LAB — BASIC METABOLIC PANEL
ANION GAP: 9 (ref 5–15)
BUN: 45 mg/dL — ABNORMAL HIGH (ref 6–20)
CHLORIDE: 98 mmol/L — AB (ref 101–111)
CO2: 32 mmol/L (ref 22–32)
Calcium: 7.9 mg/dL — ABNORMAL LOW (ref 8.9–10.3)
Creatinine, Ser: 1.4 mg/dL — ABNORMAL HIGH (ref 0.44–1.00)
GFR calc non Af Amer: 32 mL/min — ABNORMAL LOW (ref >60)
GFR, EST AFRICAN AMERICAN: 38 mL/min — AB (ref >60)
Glucose, Bld: 124 mg/dL — ABNORMAL HIGH (ref 65–99)
POTASSIUM: 3.6 mmol/L (ref 3.5–5.1)
Sodium: 139 mmol/L (ref 135–145)

## 2015-05-27 LAB — APTT
APTT: 131 s — AB (ref 24–36)
aPTT: 74 seconds — ABNORMAL HIGH (ref 24–36)

## 2015-05-27 LAB — GLUCOSE, CAPILLARY
GLUCOSE-CAPILLARY: 114 mg/dL — AB (ref 65–99)
GLUCOSE-CAPILLARY: 200 mg/dL — AB (ref 65–99)
Glucose-Capillary: 127 mg/dL — ABNORMAL HIGH (ref 65–99)
Glucose-Capillary: 149 mg/dL — ABNORMAL HIGH (ref 65–99)
Glucose-Capillary: 186 mg/dL — ABNORMAL HIGH (ref 65–99)
Glucose-Capillary: 258 mg/dL — ABNORMAL HIGH (ref 65–99)

## 2015-05-27 LAB — MAGNESIUM: MAGNESIUM: 2.3 mg/dL (ref 1.7–2.4)

## 2015-05-27 SURGERY — LOWER EXTREMITY ANGIOGRAPHY
Anesthesia: Moderate Sedation | Laterality: Right

## 2015-05-27 MED ORDER — MAGIC MOUTHWASH
5.0000 mL | Freq: Four times a day (QID) | ORAL | Status: DC
  Administered 2015-05-27 – 2015-05-31 (×12): 5 mL via ORAL
  Filled 2015-05-27: qty 5
  Filled 2015-05-27 (×6): qty 10
  Filled 2015-05-27: qty 5
  Filled 2015-05-27: qty 10
  Filled 2015-05-27: qty 5
  Filled 2015-05-27 (×5): qty 10
  Filled 2015-05-27: qty 5
  Filled 2015-05-27 (×2): qty 10

## 2015-05-27 MED ORDER — LIDOCAINE HCL (PF) 1 % IJ SOLN
INTRAMUSCULAR | Status: DC | PRN
  Administered 2015-05-27: 10 mL via INTRADERMAL

## 2015-05-27 MED ORDER — MIDAZOLAM HCL 2 MG/2ML IJ SOLN
INTRAMUSCULAR | Status: DC | PRN
  Administered 2015-05-27: 1 mg via INTRAVENOUS
  Administered 2015-05-27: 2 mg via INTRAVENOUS

## 2015-05-27 MED ORDER — OXYCODONE HCL ER 10 MG PO T12A
20.0000 mg | EXTENDED_RELEASE_TABLET | Freq: Once | ORAL | Status: AC
  Administered 2015-05-27: 20 mg via ORAL
  Filled 2015-05-27: qty 2

## 2015-05-27 MED ORDER — FENTANYL CITRATE (PF) 100 MCG/2ML IJ SOLN
INTRAMUSCULAR | Status: AC
  Filled 2015-05-27: qty 2

## 2015-05-27 MED ORDER — MORPHINE SULFATE (PF) 4 MG/ML IV SOLN
4.0000 mg | INTRAVENOUS | Status: DC | PRN
  Administered 2015-05-27 – 2015-05-30 (×3): 4 mg via INTRAVENOUS
  Filled 2015-05-27 (×3): qty 1

## 2015-05-27 MED ORDER — DEXTROSE-NACL 5-0.45 % IV SOLN
INTRAVENOUS | Status: DC
  Administered 2015-05-27 (×2): via INTRAVENOUS

## 2015-05-27 MED ORDER — LIDOCAINE HCL (PF) 1 % IJ SOLN
INTRAMUSCULAR | Status: AC
  Filled 2015-05-27: qty 10

## 2015-05-27 MED ORDER — HEPARIN SODIUM (PORCINE) 1000 UNIT/ML IJ SOLN
INTRAMUSCULAR | Status: DC | PRN
  Administered 2015-05-27: 5000 [IU] via INTRAVENOUS

## 2015-05-27 MED ORDER — HEPARIN (PORCINE) IN NACL 2-0.9 UNIT/ML-% IJ SOLN
INTRAMUSCULAR | Status: AC
  Filled 2015-05-27: qty 1000

## 2015-05-27 MED ORDER — IPRATROPIUM-ALBUTEROL 0.5-2.5 (3) MG/3ML IN SOLN
3.0000 mL | Freq: Four times a day (QID) | RESPIRATORY_TRACT | Status: DC
  Administered 2015-05-27 – 2015-05-29 (×8): 3 mL via RESPIRATORY_TRACT
  Filled 2015-05-27 (×8): qty 3

## 2015-05-27 MED ORDER — OXYCODONE-ACETAMINOPHEN 5-325 MG PO TABS
ORAL_TABLET | ORAL | Status: AC
  Filled 2015-05-27: qty 1

## 2015-05-27 MED ORDER — DIPHENHYDRAMINE HCL 50 MG/ML IJ SOLN: 12.5000 mg | Freq: Four times a day (QID) | INTRAMUSCULAR | Status: DC | PRN

## 2015-05-27 MED ORDER — SODIUM BICARBONATE 8.4 % IV SOLN
INTRAVENOUS | Status: AC
  Filled 2015-05-27: qty 500

## 2015-05-27 MED ORDER — MIDAZOLAM HCL 5 MG/5ML IJ SOLN
INTRAMUSCULAR | Status: AC
  Filled 2015-05-27: qty 5

## 2015-05-27 MED ORDER — FENTANYL CITRATE (PF) 100 MCG/2ML IJ SOLN
INTRAMUSCULAR | Status: DC | PRN
  Administered 2015-05-27 (×2): 50 ug via INTRAVENOUS

## 2015-05-27 MED ORDER — HYDROMORPHONE HCL 1 MG/ML IJ SOLN
INTRAMUSCULAR | Status: AC
  Filled 2015-05-27: qty 1

## 2015-05-27 MED ORDER — CEFAZOLIN SODIUM 1-5 GM-% IV SOLN
INTRAVENOUS | Status: AC
  Filled 2015-05-27: qty 50

## 2015-05-27 MED ORDER — SODIUM BICARBONATE BOLUS VIA INFUSION
INTRAVENOUS | Status: AC
  Filled 2015-05-27: qty 1

## 2015-05-27 MED ORDER — ONDANSETRON HCL 4 MG/2ML IJ SOLN
INTRAMUSCULAR | Status: AC
  Administered 2015-05-27: 4 mg via INTRAVENOUS
  Filled 2015-05-27: qty 2

## 2015-05-27 MED ORDER — IOHEXOL 300 MG/ML  SOLN
INTRAMUSCULAR | Status: DC | PRN
  Administered 2015-05-27: 95 mL via INTRA_ARTERIAL

## 2015-05-27 MED ORDER — NYSTATIN 100000 UNIT/ML MT SUSP
5.0000 mL | Freq: Four times a day (QID) | OROMUCOSAL | Status: DC
  Filled 2015-05-27 (×4): qty 5

## 2015-05-27 SURGICAL SUPPLY — 22 items
BALLN ULTRVRSE 3X40X130C (BALLOONS) ×4
BALLOON ULTRVRSE 3X40X130C (BALLOONS) ×2 IMPLANT
CATH CROSSER 14S OTW 146CM (CATHETERS) ×4 IMPLANT
CATH CXI SUPP ST 2.6FR 150CM (MICROCATHETER) ×4 IMPLANT
CATH CXI SUPP ST 4FR 135CM (MICROCATHETER) ×4 IMPLANT
CATH ROYAL FLUSH PIG 5F 70CM (CATHETERS) ×4 IMPLANT
CATH SIDEKICK XL ST 110CM (SHEATH) ×4 IMPLANT
CATH STS 5FR 125CM (CATHETERS) ×4 IMPLANT
CATH VERT 100CM (CATHETERS) ×4 IMPLANT
DEVICE CLOSURE MYNXGRIP 6/7F (Vascular Products) ×4 IMPLANT
DEVICE PRESTO INFLATION (MISCELLANEOUS) ×4 IMPLANT
GLIDEWIRE ADV .035X260CM (WIRE) ×4 IMPLANT
GLIDEWIRE ANGLED SS 035X260CM (WIRE) ×4 IMPLANT
GUIDEWIRE PFTE-COATED .018X300 (WIRE) ×4 IMPLANT
KIT FLOWMATE PROCEDURAL (MISCELLANEOUS) ×4 IMPLANT
PACK ANGIOGRAPHY (CUSTOM PROCEDURE TRAY) ×4 IMPLANT
SHEATH BRITE TIP 5FRX11 (SHEATH) ×4 IMPLANT
SHEATH RAABE 7FR (SHEATH) ×4 IMPLANT
SYR MEDRAD MARK V 150ML (SYRINGE) ×4 IMPLANT
TUBING CONTRAST HIGH PRESS 72 (TUBING) ×4 IMPLANT
WIRE J 3MM .035X145CM (WIRE) ×4 IMPLANT
WIRE SPARTACORE .014X300CM (WIRE) ×4 IMPLANT

## 2015-05-27 NOTE — Progress Notes (Signed)
ANTICOAGULATION CONSULT NOTE - Initial Consult  Pharmacy Consult for heparin monitoring Indication: ischemic foot  Allergies  Allergen Reactions  . Dilaudid [Hydromorphone Hcl] Nausea And Vomiting    Pt experienced severe NV and constant dry heaves with no relief from the pain.   Marland Kitchen Morphine And Related Hives and Other (See Comments)    lightheaded    Patient Measurements: Height:  (147.3 cm) Weight: 205 lb (92.987 kg) IBW/kg (Calculated) : 40.9  Vital Signs: Temp: 98.2 F (36.8 C) (09/13 2136) Temp Source: Oral (09/13 2136) BP: 139/51 mmHg (09/13 2136) Pulse Rate: 153 (09/13 2136)  Labs:  Recent Labs  05/26/15 1242 05/27/15 0514 05/27/15 0837 05/27/15 2232  HGB 10.8* 9.4*  --   --   HCT 32.7* 28.1*  --   --   PLT 259 231  --   --   APTT 29  --  131* 74*  LABPROT 14.3  --   --   --   INR 1.09  --   --   --   CREATININE 1.40* 1.40*  --   --     Estimated Creatinine Clearance: 27.1 mL/min (by C-G formula based on Cr of 1.4).  Assessment: Patient admitted with ischemic foot, started on heparin 15units/kg (1400 units/hr) yesterday at ~13:30. Discussed with PA this morning, pharmacy will follow heparin drip targeting aPTT of 60-90 seconds.  Goal of Therapy:  APTT 60-90 sec   Plan:  Stat aPTT this morning: 131, will plan to decrease rate to 1200 units/hr. Spoke to RN - patient off of the floor for angiogram. Will follow up with RN when patient returns if heparin continued after angiogram.  Will continue to follow  0913 2230 APTT at goal, will order confirmatory level in 8 hours.  Jhalil Silvera A. Dahlia Bailiff, PharmD Clinical Pharmacist  05/27/2015 11:52 PM

## 2015-05-27 NOTE — Progress Notes (Signed)
Report called to nurse leigh, pt gcs 15, mynx closure with dressing left groin cdi, pt labored at times 95% sat on room air. Pt son at bedside

## 2015-05-27 NOTE — Progress Notes (Signed)
ANTICOAGULATION CONSULT NOTE - Initial Consult  Pharmacy Consult for heparin monitoring Indication: ischemic foot  Allergies  Allergen Reactions  . Dilaudid [Hydromorphone Hcl] Nausea And Vomiting    Pt experienced severe NV and constant dry heaves with no relief from the pain.   Marland Kitchen Morphine And Related Hives and Other (See Comments)    lightheaded    Patient Measurements: Height: 4' 10.5" (148.6 cm) Weight: 205 lb 11.2 oz (93.305 kg) IBW/kg (Calculated) : 42.05  Vital Signs: Temp: 98.2 F (36.8 C) (09/13 0535) Temp Source: Oral (09/13 0535) BP: 127/59 mmHg (09/13 0535) Pulse Rate: 72 (09/13 0535)  Labs:  Recent Labs  05/26/15 1242 05/27/15 0514 05/27/15 0837  HGB 10.8* 9.4*  --   HCT 32.7* 28.1*  --   PLT 259 231  --   APTT 29  --  131*  LABPROT 14.3  --   --   INR 1.09  --   --   CREATININE 1.40* 1.40*  --     Estimated Creatinine Clearance: 27.4 mL/min (by C-G formula based on Cr of 1.4).  Assessment: Patient admitted with ischemic foot, started on heparin 15units/kg (1400 units/hr) yesterday at ~13:30. Discussed with PA this morning, pharmacy will follow heparin drip targeting aPTT of 60-90 seconds.  Goal of Therapy:  APTT 60-90 sec   Plan:  Stat aPTT this morning: 131, will plan to decrease rate to 1200 units/hr. Spoke to RN - patient off of the floor for angiogram. Will follow up with RN when patient returns if heparin continued after angiogram.  Will continue to follow  Garlon Hatchet, PharmD Clinical Pharmacist  05/27/2015 9:15 AM

## 2015-05-27 NOTE — Progress Notes (Signed)
Dr schnier at bedside to update pt and son

## 2015-05-27 NOTE — Op Note (Signed)
[facility]  Percutaneous Study/Intervention Procedural Note   Date of Surgery: 05/27/2015  Surgeon:Schnier, Latina Craver   Pre-operative Diagnosis: Ischemia right leg; atherosclerotic occlusive disease bilateral lower extremities with rest pain right lower extremity Post-operative diagnosis:  Same  Procedure(s) Performed:  1.  Abdominal aortogram  2.  Right lower extremity distal runoff third order catheter placement  3.  Crosser atherectomy right superficial femoral and popliteal arteries unsuccessful   Anesthesia: Conscious sedation with IV Versed and fentanyl  Sheath: 7 French sheath left common femoral artery  Contrast: 95 cc  Fluoroscopy Time: Approximately 32 minutes  Indications:  Patient presented with a cold painful right foot noting dependent rubor. She was subsequently admitted from the office to the hospital started on a heparin drip is now undergoing angiography for limb salvage.  Procedure:  [name] a 81 y.o. female who was identified and appropriate procedural time out was performed.  The patient was then placed supine on the table and prepped and draped in the usual sterile fashion.  Ultrasound was used to evaluate the left common femoral artery.  It was patent .  A digital ultrasound image was acquired.  A micropuncture needle was used to access the left common femoral artery under direct ultrasound guidance and a permanent image was performed. Microwire followed by micro-sheath A 0.035 J wire was advanced without resistance and a 5Fr sheath was placed.    Wire and catheter were then negotiated to the level of T12 and AP projection of the aorta was obtained. Pigtail catheter was repositioned to above the bifurcation and and LAO projection of the pelvis was obtained. Using a stiff angle Glidewire and the pigtail catheter the catheter was crossed over the bifurcation and positioned in the distal external where an RAO projection the groin was  obtained. Wire and catheter were then negotiated into the SFA were distal runoff was obtained. Distal runoff demonstrated occlusion at Hunter's canal.  Given the occlusion of the superficial femoral artery 5000 units of heparin was given stiff angle glide wires reintroduced 7 [place] rabies was advanced up and over the bifurcation under fluoroscopic guidance and a 14 asked Crosser atherectomy catheter was prepped on the back table. A straight Sidekick catheter was advanced over the Glidewire and the 14 asked was introduced. Catheter 14 S Crosser were then negotiated down to the mid popliteal unfortunately further advancement was unattainable. Over the next proximal 20 minutes multiple different catheters and wires were utilized but reentry into the distal peroneal was never achieved. Sheath was then pulled back oblique view of the groin was obtained and a minx device was deployed on the left side.  Findings:   Aortogram:  Abdominal aorta demonstrates diffuse disease but there are no hemodynamically significant lesions. The common iliacs and external iliacs are also patent bilaterally without evidence of hemodynamically significant disease. Significant hardware from her lower back surgeries as noted as well.  Right Lower Extremity:  The right common femoral profunda femoris and proximal SFA are patent. Distal SFA occludes at Hunter's canal and remains occluded throughout the popliteal tibioperoneal trunk and tibial vessels. There is a vague suggestion that the peroneal is reconstituted in its midportion. There is no evidence of reconstitution of the posterior tibial anterotibial at all. What is visualized of the peroneal is significantly diseased.  Left Lower Extremity:  Profunda femoris superficial femoral proximally and the common femoral are patent.  Attempts at crossing the lengthy occlusion from the level of Hunter's canal to the  mid peroneal unsuccessful. Patient will be reassessed and a Star and the  degree of pain she is experiencing will likely need an above-knee amputation.    Disposition: Patient was taken to the recovery room in stable condition having tolerated the procedure well.  Schnier, Latina Craver 05/27/2015,12:10 PM

## 2015-05-27 NOTE — Progress Notes (Signed)
Pt repositioned with dr schnier asst, okay to elevate hob per md.

## 2015-05-27 NOTE — Progress Notes (Signed)
[name] is a 81 y.o. female   SUBJECTIVE:  Patient merited with ischemic right leg, notes moderate pain there. Also has moderate pain in her right humerus. Complains of sore throat.  ______________________________________________________________________  ROS: Review of systems is unremarkable for any active cardiac,respiratory, GI, GU, hematologic, neurologic or psychiatric systems, 10 systems reviewed.  Marland Kitchen allopurinol  200 mg Oral Daily  . atorvastatin  20 mg Oral QHS  . bisoprolol-hydrochlorothiazide  1 tablet Oral Daily   And  . bisoprolol  5 mg Oral Daily  . cefTRIAXone (ROCEPHIN)  IV  1 g Intravenous Q24H  . docusate sodium  100 mg Oral BID  . escitalopram  5 mg Oral Daily  . famotidine  20 mg Oral BID  . insulin aspart  0-24 Units Subcutaneous 6 times per day  . insulin aspart  30 Units Subcutaneous TID WC  . insulin glargine  60 Units Subcutaneous QHS  . isosorbide mononitrate  60 mg Oral Daily  . losartan  100 mg Oral Daily  . metolazone  2.5 mg Oral QODAY  . nystatin  5 mL Oral QID  . pantoprazole  40 mg Oral Daily  . torsemide  20 mg Oral Daily   acetaminophen **OR** acetaminophen, nitroGLYCERIN, ondansetron **OR** ondansetron (ZOFRAN) IV, oxyCODONE-acetaminophen, oxyCODONE-acetaminophen   Past Medical History  Diagnosis Date  . Cancer   . Blood transfusion without reported diagnosis   . CHF (congestive heart failure)   . Coronary artery disease   . Hypertension   . Shortness of breath dyspnea   . Diabetes mellitus without complication   . Peripheral vascular disease     Past Surgical History  Procedure Laterality Date  . Back surgery    . Coronary artery bypass graft    . Vascular surgery      PHYSICAL EXAM:  BP 127/59 mmHg  Pulse 72  Temp(Src) 98.2 F (36.8 C) (Oral)  Resp 17  Ht 4' 10.5" (1.486 m)  Wt 93.305 kg (205 lb 11.2 oz)  BMI 42.25 kg/m2  SpO2 100%  Wt Readings from Last 3 Encounters:  05/27/15 93.305 kg (205 lb 11.2 oz)   05/18/15 92.534 kg (204 lb)           BP Readings from Last 3 Encounters:  05/27/15 127/59  05/18/15 156/56    Constitutional: NAD Neck: supple, no thyromegaly Respiratory: CTA, no rales or wheezes Cardiovascular: RRR, no murmur, no gallop Abdomen: soft, good BS, nontender Extremities: 2+ edema, right shoulder in sling Neuro: alert and oriented, no focal motor or sensory deficits  ASSESSMENT/PLAN:  Labs and imaging studies were reviewed  Sore throat-likely early thrush, nystatin Diabetes mellitus-hold insulin this morning, with controlled sugars and nothing by mouth will go to D5 half-normal Ischemic leg-IV heparin, angiogram today with vascular Right leg vasculitis-IV Rocephin Right humeral fracture-Percocet when necessary Chronic systolic congestive heart failure-follow closely, recently compensated currently, continue medication

## 2015-05-27 NOTE — Progress Notes (Signed)
Pt complaining of pain. Pt and family requesting that the pt be given Morphine and stated that she was not allergic to it. Dr. Gilda Crease notified and Orders received for morphine and benadryl. Primary nurse to continue to monitor.

## 2015-05-28 ENCOUNTER — Inpatient Hospital Stay: Payer: Medicare Other

## 2015-05-28 LAB — BASIC METABOLIC PANEL
ANION GAP: 11 (ref 5–15)
BUN: 42 mg/dL — ABNORMAL HIGH (ref 6–20)
CALCIUM: 8 mg/dL — AB (ref 8.9–10.3)
CO2: 31 mmol/L (ref 22–32)
CREATININE: 1.71 mg/dL — AB (ref 0.44–1.00)
Chloride: 98 mmol/L — ABNORMAL LOW (ref 101–111)
GFR, EST AFRICAN AMERICAN: 30 mL/min — AB (ref >60)
GFR, EST NON AFRICAN AMERICAN: 26 mL/min — AB (ref >60)
Glucose, Bld: 272 mg/dL — ABNORMAL HIGH (ref 65–99)
Potassium: 3.9 mmol/L (ref 3.5–5.1)
SODIUM: 140 mmol/L (ref 135–145)

## 2015-05-28 LAB — GLUCOSE, CAPILLARY
GLUCOSE-CAPILLARY: 157 mg/dL — AB (ref 65–99)
GLUCOSE-CAPILLARY: 127 mg/dL — AB (ref 65–99)
Glucose-Capillary: 268 mg/dL — ABNORMAL HIGH (ref 65–99)
Glucose-Capillary: 231 mg/dL — ABNORMAL HIGH (ref 65–99)
Glucose-Capillary: 99 mg/dL (ref 65–99)

## 2015-05-28 LAB — CBC WITH DIFFERENTIAL/PLATELET
Basophils Absolute: 0.1 10*3/uL (ref 0–0.1)
Basophils Relative: 1 %
Eosinophils Absolute: 0 10*3/uL (ref 0–0.7)
Eosinophils Relative: 0 %
HCT: 24.4 % — ABNORMAL LOW (ref 35.0–47.0)
Hemoglobin: 7.9 g/dL — ABNORMAL LOW (ref 12.0–16.0)
Lymphocytes Relative: 14 %
Lymphs Abs: 1.6 10*3/uL (ref 1.0–3.6)
MCH: 29.6 pg (ref 26.0–34.0)
MCHC: 32.5 g/dL (ref 32.0–36.0)
MCV: 91.1 fL (ref 80.0–100.0)
Monocytes Absolute: 1 10*3/uL — ABNORMAL HIGH (ref 0.2–0.9)
Monocytes Relative: 9 %
Neutro Abs: 8.6 10*3/uL — ABNORMAL HIGH (ref 1.4–6.5)
Neutrophils Relative %: 76 %
Platelets: 220 10*3/uL (ref 150–440)
RBC: 2.67 MIL/uL — ABNORMAL LOW (ref 3.80–5.20)
RDW: 15.8 % — ABNORMAL HIGH (ref 11.5–14.5)
WBC: 11.3 10*3/uL — ABNORMAL HIGH (ref 3.6–11.0)

## 2015-05-28 LAB — APTT
APTT: 41 s — AB (ref 24–36)
APTT: 82 s — AB (ref 24–36)

## 2015-05-28 MED ORDER — PANTOPRAZOLE SODIUM 40 MG PO TBEC
40.0000 mg | DELAYED_RELEASE_TABLET | Freq: Two times a day (BID) | ORAL | Status: DC
  Administered 2015-05-28 – 2015-05-31 (×6): 40 mg via ORAL
  Filled 2015-05-28 (×7): qty 1

## 2015-05-28 MED ORDER — SODIUM CHLORIDE 0.45 % IV SOLN
INTRAVENOUS | Status: DC
  Administered 2015-05-28 – 2015-05-29 (×2): via INTRAVENOUS

## 2015-05-28 NOTE — Consult Note (Signed)
ORTHOPAEDIC CONSULTATION  REQUESTING PHYSICIAN: Renford Dills, MD  Chief Complaint: Right distal humerus fracture  HPI: [name] is a 81 y.o. female who complains of  right distal humerus fracture which occurred during a fall 05/18/2015.  She was evaluated in the [facility] emergency room and splinted.  She followed up with Dr. Joice Lofts in his office last week.  She is now admitted under the vascular service for right foot problems.  She is not complaining of significant arm pain at this time.  Past Medical History  Diagnosis Date  . Cancer   . Blood transfusion without reported diagnosis   . CHF (congestive heart failure)   . Coronary artery disease   . Hypertension   . Shortness of breath dyspnea   . Diabetes mellitus without complication   . Peripheral vascular disease    Past Surgical History  Procedure Laterality Date  . Back surgery    . Coronary artery bypass graft    . Vascular surgery     Social History   Social History  . Marital Status: Widowed    Spouse Name: N/A  . Number of Children: N/A  . Years of Education: N/A   Social History Main Topics  . Smoking status: Never Smoker   . Smokeless tobacco: None  . Alcohol Use: No  . Drug Use: No  . Sexual Activity: Not Asked   Other Topics Concern  . None   Social History Narrative   Family History  Problem Relation Age of Onset  . Diabetes Mother   . Diabetes Father    Allergies  Allergen Reactions  . Dilaudid [Hydromorphone Hcl] Nausea And Vomiting    Pt experienced severe NV and constant dry heaves with no relief from the pain.   Marland Kitchen Morphine And Related Hives and Other (See Comments)    lightheaded   Prior to Admission medications   Medication Sig Start Date End Date Taking? Authorizing Provider  allopurinol (ZYLOPRIM) 100 MG tablet Take 200 mg by mouth daily.   Yes Historical Provider, MD  aspirin EC 81 MG tablet Take 81 mg by mouth daily.   Yes Historical Provider, MD   atorvastatin (LIPITOR) 40 MG tablet Take 20 mg by mouth at bedtime.    Yes Historical Provider, MD  azelastine (ASTELIN) 0.1 % nasal spray Place 1 spray into both nostrils as needed for rhinitis. Use in each nostril as directed   Yes Historical Provider, MD  bisoprolol-hydrochlorothiazide (ZIAC) 10-6.25 MG per tablet Take 1 tablet by mouth 2 (two) times daily.   Yes Historical Provider, MD  clopidogrel (PLAVIX) 75 MG tablet Take 75 mg by mouth daily.   Yes Historical Provider, MD  fexofenadine (ALLEGRA) 60 MG tablet Take 60 mg by mouth as needed for allergies or rhinitis.   Yes Historical Provider, MD  insulin glargine (LANTUS) 100 UNIT/ML injection Inject 60 Units into the skin at bedtime.   Yes Historical Provider, MD  insulin lispro (HUMALOG) 100 UNIT/ML injection Inject 30 Units into the skin 3 (three) times daily before meals.   Yes Historical Provider, MD  isosorbide mononitrate (IMDUR) 60 MG 24 hr tablet Take 60 mg by mouth daily.   Yes Historical Provider, MD  losartan-hydrochlorothiazide (HYZAAR) 100-25 MG per tablet Take 1 tablet by mouth daily.   Yes Historical Provider, MD  metolazone (ZAROXOLYN) 2.5 MG tablet Take 2.5 mg by mouth every other day.   Yes Historical Provider, MD  Multiple Vitamin (MULTIVITAMIN) tablet Take 1 tablet  by mouth daily.   Yes Historical Provider, MD  nitroGLYCERIN (NITRODUR - DOSED IN MG/24 HR) 0.4 mg/hr patch Place 0.4 mg onto the skin daily.   Yes Historical Provider, MD  omeprazole (PRILOSEC) 20 MG capsule Take 20 mg by mouth daily as needed.   Yes Historical Provider, MD  oxyCODONE-acetaminophen (PERCOCET/ROXICET) 5-325 MG per tablet Take 1 tablet by mouth 3 (three) times daily as needed for severe pain.   Yes Historical Provider, MD  potassium chloride SA (K-DUR,KLOR-CON) 20 MEQ tablet Take 40 mEq by mouth daily.   Yes Historical Provider, MD  torsemide (DEMADEX) 20 MG tablet Take 20 mg by mouth daily.   Yes Historical Provider, MD   HYDROcodone-acetaminophen (NORCO/VICODIN) 5-325 MG per tablet Take 1 tablet by mouth every 6 (six) hours as needed. 05/18/15   Historical Provider, MD  promethazine (PHENERGAN) 12.5 MG tablet Take 1 tablet (12.5 mg total) by mouth every 8 (eight) hours as needed for nausea or vomiting. 05/18/15   Jennye Moccasin, MD   Dg Chest Port 1 View  05/28/2015   CLINICAL DATA:  Status post PICC line placement.  EXAM: PORTABLE CHEST - 1 VIEW  COMPARISON:  May 18, 2015  FINDINGS: The heart size and mediastinal contours are stable. The heart size is enlarged. Patient is status post prior CABG and median sternotomy. A left subclavian central venous line is identified with distal tip in superior vena cava. There is no pneumothorax. There is mild central pulmonary vascular congestion. The visualized skeletal structures are stable.  IMPRESSION: Left central venous line distal tip in the superior vena cava. There is no pneumothorax.  Mild central pulmonary vascular congestion.   Electronically Signed   By: Sherian Rein M.D.   On: 05/28/2015 17:35    Positive ROS: All other systems have been reviewed and were otherwise negative with the exception of those mentioned in the HPI and as above.  Physical Exam: General: Alert, no acute distress Cardiovascular: No pedal edema Respiratory: No cyanosis, no use of accessory musculature GI: No organomegaly, abdomen is soft and non-tender Skin: No lesions in the area of chief complaint Neurologic: Sensation intact distally Psychiatric: Patient is competent for consent with normal mood and affect Lymphatic: No axillary or cervical lymphadenopathy  MUSCULOSKELETAL: Right arm is encased in a long-arm splint.  There is some swelling distally but neurovascular status is intact.  She is able to move the fingers well.  His no drainage on the Ace bandages.  Assessment: Right distal humeral shaft fracture  Plan: The patient will follow up with Dr. Joice Lofts as scheduled.  No  active treatment in the hospital was indicated this time.    Valinda Hoar, MD [phone]   05/28/2015 6:50 PM

## 2015-05-28 NOTE — Care Management Note (Signed)
Case Management Note  Patient Details  Name: [name] MRN: [id number] Date of Birth: 1925/02/15  Subjective/Objective:       Saw pt. Just to check in. She lives at [facility] normally. MD talking about poss. AKA  As the attempt to re-vascularize the pt. Was unsuccessful.  Pt is down in spirits today, with son and daughter both at bedside. Will follow.             Action/Plan:   Expected Discharge Date:  05/27/15               Expected Discharge Plan:     In-House Referral:     Discharge planning Services     Post Acute Care Choice:    Choice offered to:     DME Arranged:    DME Agency:     HH Arranged:    HH Agency:     Status of Service:     Medicare Important Message Given:  Yes-second notification given Date Medicare IM Given:    Medicare IM give by:    Date Additional Medicare IM Given:    Additional Medicare Important Message give by:     If discussed at Long Length of Stay Meetings, dates discussed:    Additional Comments:  Berna Bue, RN 05/28/2015, 10:32 AM

## 2015-05-28 NOTE — Care Management Important Message (Signed)
Important Message  Patient Details  Name: [name] MRN: [id number] Date of Birth: [birth year]   Medicare Important Message Given:  Yes-second notification given    [name] 05/28/2015, 10:21 AM

## 2015-05-28 NOTE — Progress Notes (Signed)
Patient ID: [name], female   DOB: December 13, 1924, 81 y.o.   MRN: [id number] [name] is a 81 y.o. female   SUBJECTIVE:  Patient merited with ischemic right leg, notes moderate pain there. Also has moderate pain in her right humerus. Moderate pain in shoulder, no leg pain  ______________________________________________________________________  ROS: Review of systems is unremarkable for any active cardiac,respiratory, GI, GU, hematologic, neurologic or psychiatric systems, 10 systems reviewed.  Marland Kitchen allopurinol  200 mg Oral Daily  . atorvastatin  20 mg Oral QHS  . bisoprolol-hydrochlorothiazide  1 tablet Oral Daily   And  . bisoprolol  5 mg Oral Daily  . cefTRIAXone (ROCEPHIN)  IV  1 g Intravenous Q24H  . docusate sodium  100 mg Oral BID  . escitalopram  5 mg Oral Daily  . famotidine  20 mg Oral BID  . insulin aspart  0-24 Units Subcutaneous 6 times per day  . insulin aspart  30 Units Subcutaneous TID WC  . insulin glargine  60 Units Subcutaneous QHS  . ipratropium-albuterol  3 mL Nebulization Q6H  . isosorbide mononitrate  60 mg Oral Daily  . losartan  100 mg Oral Daily  . magic mouthwash  5 mL Oral QID  . metolazone  2.5 mg Oral QODAY  . pantoprazole  40 mg Oral Daily  . sodium bicarbonate   Intravenous to XRAY   And  . sodium bicarbonate (CIN) infusion   Intravenous to XRAY  . torsemide  20 mg Oral Daily   acetaminophen **OR** acetaminophen, diphenhydrAMINE, morphine injection, nitroGLYCERIN, ondansetron **OR** ondansetron (ZOFRAN) IV, oxyCODONE-acetaminophen, oxyCODONE-acetaminophen   Past Medical History  Diagnosis Date  . Cancer   . Blood transfusion without reported diagnosis   . CHF (congestive heart failure)   . Coronary artery disease   . Hypertension   . Shortness of breath dyspnea   . Diabetes mellitus without complication   . Peripheral vascular disease     Past Surgical History  Procedure Laterality Date  . Back surgery    . Coronary artery bypass  graft    . Vascular surgery      PHYSICAL EXAM:  BP 140/80 mmHg  Pulse 90  Temp(Src) 98.1 F (36.7 C) (Oral)  Resp 20  Ht  (1.473 m)  Wt 93.123 kg (205 lb 4.8 oz)  BMI 42.92 kg/m2  SpO2 97%  Wt Readings from Last 3 Encounters:  05/28/15 93.123 kg (205 lb 4.8 oz)  05/18/15 92.534 kg (204 lb)           BP Readings from Last 3 Encounters:  05/28/15 140/80  05/18/15 156/56    Constitutional: NAD Neck: supple, no thyromegaly Respiratory: CTA, no rales or wheezes Cardiovascular: RRR, no murmur, no gallop Abdomen: soft, good BS, nontender Extremities: 2+ edema, right shoulder in sling Neuro: alert and oriented, no focal motor or sensory deficits  ASSESSMENT/PLAN:  Labs and imaging studies were reviewed  Sore throat-magic mouthwash  Diabetes mellitus- back to usual dosing Ischemic leg-IV heparin, not amenable to angioplasty, considering above knee amputation Right leg cellulitis-IV Rocephin, better Right humeral fracture-Percocet when necessary Chronic systolic congestive heart failure-follow closely, compensated currently, continue medication  anemia-follow closely CKD-half-normal saline, bicarbonate post angiogram

## 2015-05-28 NOTE — Progress Notes (Addendum)
[facility]  Daily Progress Note  Subjective: 1 Day Post-Op: Abdominal aortogram, Right lower extremity distal runoff third order catheter placement and Crosser atherectomy right superficial femoral and popliteal arteries unsuccessful.  Patient confused this AM. But states her leg doesn't hurt "as much".  Objective: Filed Vitals:   05/27/15 2200 05/28/15 0244 05/28/15 0400 05/28/15 0500  BP:   140/80   Pulse: 99  90   Temp: 98.3 F (36.8 C)  98.1 F (36.7 C)   TempSrc: Oral  Oral   Resp: 20  20   Height:      Weight:    93.123 kg (205 lb 4.8 oz)  SpO2:  95% 97%     Intake/Output Summary (Last 24 hours) at 05/28/15 0817 Last data filed at 05/28/15 0815  Gross per 24 hour  Intake      0 ml  Output    725 ml  Net   -725 ml    Physical Exam: A&Ox3, NAD CV: RRR Pulmonary: CTA Bilaterally Abdomen: Soft, Nontender, Nondistended Vascular: Right Extremity: Thigh soft, calf edematous and cool with dependent rubor Left Extremity: Thigh soft, calf soft, foot warm.   Laboratory: CBC    Component Value Date/Time   WBC 11.3* 05/28/2015 0513   HGB 7.9* 05/28/2015 0513   HCT 24.4* 05/28/2015 0513   PLT 220 05/28/2015 0513    BMET    Component Value Date/Time   NA 140 05/28/2015 0513   K 3.9 05/28/2015 0513   CL 98* 05/28/2015 0513   CO2 31 05/28/2015 0513   GLUCOSE 272* 05/28/2015 0513   BUN 42* 05/28/2015 0513   CREATININE 1.71* 05/28/2015 0513   CALCIUM 8.0* 05/28/2015 0513   GFRNONAA 26* 05/28/2015 0513   GFRAA 30* 05/28/2015 0513    Assessment/Planning: 81 year old female s/p right lower extremity intervention for ischemic leg - unable to revascularize. 1) Patient may need AKA - Dr. Gilda Crease to discuss with patient and family 2) Pain control 3) Care as per primary team.   Cleda Daub PA-C 05/28/2015 8:17 AM

## 2015-05-28 NOTE — Progress Notes (Signed)
ANTICOAGULATION CONSULT NOTE - Initial Consult  Pharmacy Consult for heparin monitoring Indication: ischemic foot  Allergies  Allergen Reactions  . Dilaudid [Hydromorphone Hcl] Nausea And VomiMarland Kitchenting    Pt experienced severe NV and constant dry heaves with no relief from the pain.   . Morphine And Related Hives and Other (See Comments)    lightheaded    Patient Measurements: Height:  (147.3 cm) Weight: 205 lb 4.8 oz (93.123 kg) IBW/kg (Calculated) : 40.9  Vital Signs: Temp: 98.1 F (36.7 C) (09/14 0400) Temp Source: Oral (09/14 0400) BP: 140/80 mmHg (09/14 0400) Pulse Rate: 90 (09/14 0400)  Labs:  Recent Labs  05/26/15 1242 05/27/15 0514 05/27/15 0837 05/27/15 2232 05/28/15 0513 05/28/15 0728  HGB 10.8* 9.4*  --   --  7.9*  --   HCT 32.7* 28.1*  --   --  24.4*  --   PLT 259 231  --   --  220  --   APTT 29  --  131* 74*  --  41*  LABPROT 14.3  --   --   --   --   --   INR 1.09  --   --   --   --   --   CREATININE 1.40* 1.40*  --   --  1.71*  --     Estimated Creatinine Clearance: 22.2 mL/min (by C-G formula based on Cr of 1.71).  Assessment: Patient admitted with ischemic foot, started on heparin 15units/kg (1400 units/hr) yesterday at ~13:30. Discussed with PA this morning, pharmacy will follow heparin drip targeting aPTT of 60-90 seconds.  Goal of Therapy:  APTT 60-90 sec   Plan: Current orders for heparin 1200 units/hr. APTT: 41, subtherapeutic. Confirmed with RN that heparin drip has been running as orderd. Will increase rate to 1350 units/hr and recheck aPTT in 6 hours  Pharmacy to follow per consult  Garlon Hatchet, PharmD Clinical Pharmacist   05/28/2015 8:36 AM

## 2015-05-28 NOTE — Progress Notes (Signed)
ANTICOAGULATION CONSULT NOTE - Initial Consult  Pharmacy Consult for heparin monitoring Indication: ischemic foot  Allergies  Allergen Reactions  . Dilaudid [Hydromorphone Hcl] Nausea And Vomiting    Pt experienced severe NV and constant dry heaves with no relief from the pain.   Marland Kitchen Morphine And Related Hives and Other (See Comments)    lightheaded    Patient Measurements: Height:  (147.3 cm) Weight: 205 lb 4.8 oz (93.123 kg) IBW/kg (Calculated) : 40.9  Vital Signs: Temp: 98.2 F (36.8 C) (09/14 2251) Temp Source: Oral (09/14 1205) BP: 142/96 mmHg (09/14 2251) Pulse Rate: 78 (09/14 2251)  Labs:  Recent Labs  05/26/15 1242 05/27/15 0514  05/27/15 2232 05/28/15 0513 05/28/15 0728 05/28/15 2031  HGB 10.8* 9.4*  --   --  7.9*  --   --   HCT 32.7* 28.1*  --   --  24.4*  --   --   PLT 259 231  --   --  220  --   --   APTT 29  --   < > 74*  --  41* 82*  LABPROT 14.3  --   --   --   --   --   --   INR 1.09  --   --   --   --   --   --   CREATININE 1.40* 1.40*  --   --  1.71*  --   --   < > = values in this interval not displayed.  Estimated Creatinine Clearance: 22.2 mL/min (by C-G formula based on Cr of 1.71).  Assessment: Patient admitted with ischemic foot, started on heparin 15units/kg (1400 units/hr) yesterday at ~13:30. Discussed with PA this morning, pharmacy will follow heparin drip targeting aPTT of 60-90 seconds.  Goal of Therapy:  APTT 60-90 sec   Plan: Current orders for heparin 1200 units/hr. APTT: 41, subtherapeutic. Confirmed with RN that heparin drip has been running as orderd. Will increase rate to 1350 units/hr and recheck aPTT in 6 hours  0914 2031 aPTT therapeutic. Ordered confirmatory level, will follow up.  Pharmacy to follow per consult  Harrold Donath A. Dahlia Bailiff, PharmD Clinical Pharmacist   05/28/2015 11:18 PM

## 2015-05-28 NOTE — Care Management Important Message (Signed)
Important Message ° °Patient Details  °Name: [name] °MRN: [id number] °Date of Birth: 04/15/1925 ° ° °Medicare Important Message Given:  Yes-second notification given ° ° ° °Kathy A Allmond °05/28/2015, 10:21 AM °

## 2015-05-29 LAB — COMPREHENSIVE METABOLIC PANEL
ALBUMIN: 2.4 g/dL — AB (ref 3.5–5.0)
ALT: 236 U/L — ABNORMAL HIGH (ref 14–54)
ANION GAP: 9 (ref 5–15)
AST: 285 U/L — ABNORMAL HIGH (ref 15–41)
Alkaline Phosphatase: 59 U/L (ref 38–126)
BUN: 52 mg/dL — ABNORMAL HIGH (ref 6–20)
CALCIUM: 7.9 mg/dL — AB (ref 8.9–10.3)
CO2: 32 mmol/L (ref 22–32)
Chloride: 98 mmol/L — ABNORMAL LOW (ref 101–111)
Creatinine, Ser: 3.2 mg/dL — ABNORMAL HIGH (ref 0.44–1.00)
GFR calc non Af Amer: 12 mL/min — ABNORMAL LOW (ref >60)
GFR, EST AFRICAN AMERICAN: 14 mL/min — AB (ref >60)
GLUCOSE: 91 mg/dL (ref 65–99)
POTASSIUM: 4.1 mmol/L (ref 3.5–5.1)
SODIUM: 139 mmol/L (ref 135–145)
Total Bilirubin: 1 mg/dL (ref 0.3–1.2)
Total Protein: 5.6 g/dL — ABNORMAL LOW (ref 6.5–8.1)

## 2015-05-29 LAB — CBC WITH DIFFERENTIAL/PLATELET
BASOS PCT: 0 %
Basophils Absolute: 0 10*3/uL (ref 0–0.1)
EOS ABS: 0.1 10*3/uL (ref 0–0.7)
EOS PCT: 1 %
HCT: 24.3 % — ABNORMAL LOW (ref 35.0–47.0)
Hemoglobin: 7.9 g/dL — ABNORMAL LOW (ref 12.0–16.0)
LYMPHS ABS: 1.5 10*3/uL (ref 1.0–3.6)
Lymphocytes Relative: 13 %
MCH: 29.4 pg (ref 26.0–34.0)
MCHC: 32.4 g/dL (ref 32.0–36.0)
MCV: 90.7 fL (ref 80.0–100.0)
MONO ABS: 1.1 10*3/uL — AB (ref 0.2–0.9)
MONOS PCT: 10 %
NEUTROS PCT: 76 %
Neutro Abs: 9 10*3/uL — ABNORMAL HIGH (ref 1.4–6.5)
PLATELETS: 263 10*3/uL (ref 150–440)
RBC: 2.67 MIL/uL — ABNORMAL LOW (ref 3.80–5.20)
RDW: 15.8 % — AB (ref 11.5–14.5)
WBC: 11.7 10*3/uL — ABNORMAL HIGH (ref 3.6–11.0)

## 2015-05-29 LAB — GLUCOSE, CAPILLARY
GLUCOSE-CAPILLARY: 111 mg/dL — AB (ref 65–99)
GLUCOSE-CAPILLARY: 111 mg/dL — AB (ref 65–99)
GLUCOSE-CAPILLARY: 139 mg/dL — AB (ref 65–99)
GLUCOSE-CAPILLARY: 171 mg/dL — AB (ref 65–99)
GLUCOSE-CAPILLARY: 61 mg/dL — AB (ref 65–99)
GLUCOSE-CAPILLARY: 85 mg/dL (ref 65–99)
Glucose-Capillary: 116 mg/dL — ABNORMAL HIGH (ref 65–99)
Glucose-Capillary: 73 mg/dL (ref 65–99)

## 2015-05-29 LAB — APTT
APTT: 111 s — AB (ref 24–36)
aPTT: 138 seconds — ABNORMAL HIGH (ref 24–36)

## 2015-05-29 MED ORDER — INSULIN GLARGINE 100 UNIT/ML ~~LOC~~ SOLN
40.0000 [IU] | Freq: Every day | SUBCUTANEOUS | Status: DC
  Filled 2015-05-29: qty 0.4

## 2015-05-29 MED ORDER — MAGNESIUM OXIDE 400 (241.3 MG) MG PO TABS
400.0000 mg | ORAL_TABLET | Freq: Every day | ORAL | Status: DC
  Administered 2015-05-29 – 2015-05-31 (×3): 400 mg via ORAL
  Filled 2015-05-29 (×3): qty 1

## 2015-05-29 MED ORDER — HEPARIN (PORCINE) IN NACL 100-0.45 UNIT/ML-% IJ SOLN
1150.0000 [IU]/h | INTRAMUSCULAR | Status: DC
  Administered 2015-05-29 (×2): 1250 [IU]/h via INTRAVENOUS
  Administered 2015-05-30: 1150 [IU]/h via INTRAVENOUS
  Filled 2015-05-29 (×6): qty 250

## 2015-05-29 MED ORDER — POTASSIUM CHLORIDE 20 MEQ PO PACK
20.0000 meq | PACK | Freq: Once | ORAL | Status: AC
  Administered 2015-05-29: 20 meq via ORAL
  Filled 2015-05-29: qty 1

## 2015-05-29 MED ORDER — IPRATROPIUM-ALBUTEROL 0.5-2.5 (3) MG/3ML IN SOLN: 3.0000 mL | Freq: Four times a day (QID) | RESPIRATORY_TRACT | Status: DC | PRN

## 2015-05-29 MED ORDER — ALPRAZOLAM 0.25 MG PO TABS
0.2500 mg | ORAL_TABLET | Freq: Two times a day (BID) | ORAL | Status: DC
  Administered 2015-05-29 – 2015-05-31 (×4): 0.25 mg via ORAL
  Filled 2015-05-29 (×4): qty 1

## 2015-05-29 NOTE — Progress Notes (Addendum)
ANTICOAGULATION CONSULT NOTE - Initial Consult  Pharmacy Consult for heparin monitoring Indication: ischemic foot  Allergies  Allergen Reactions  . Dilaudid [Hydromorphone Hcl] Nausea And Vomiting    Pt experienced severe NV and constant dry heaves with no relief from the pain.   Marland Kitchen Morphine And Related Hives and Other (See Comments)    lightheaded    Patient Measurements: Height:  (147.3 cm) Weight: 205 lb 4.8 oz (93.123 kg) IBW/kg (Calculated) : 40.9  Vital Signs: Temp: 97.6 F (36.4 C) (09/15 0623) Temp Source: Oral (09/15 0623) BP: 125/57 mmHg (09/15 0623) Pulse Rate: 82 (09/15 0623)  Labs:  Recent Labs  05/26/15 1242 05/27/15 0514  05/28/15 0513 05/28/15 0728 05/28/15 2031 05/29/15 0659  HGB 10.8* 9.4*  --  7.9*  --   --  7.9*  HCT 32.7* 28.1*  --  24.4*  --   --  24.3*  PLT 259 231  --  220  --   --  263  APTT 29  --   < >  --  41* 82* 138*  LABPROT 14.3  --   --   --   --   --   --   INR 1.09  --   --   --   --   --   --   CREATININE 1.40* 1.40*  --  1.71*  --   --   --   < > = values in this interval not displayed.  Estimated Creatinine Clearance: 22.2 mL/min (by C-G formula based on Cr of 1.71).  Assessment: Patient admitted with ischemic foot, started on heparin 15units/kg (1400 units/hr) yesterday at ~13:30. Discussed with PA this morning, pharmacy will follow heparin drip targeting aPTT of 60-90 seconds.  Goal of Therapy:  APTT 60-90 sec   Plan: Current orders for heparin 1350 units/hr, aPTT supratheraputic. Per heparin nomogram, will stop heparin infusion for 30 minutes and resume at decreased rate of 1250 units/hr and recheck aPTT in 6 hours.  Pharmacy to follow per consult  Garlon Hatchet, PharmD Clinical Pharmacist  05/29/2015 8:52 AM

## 2015-05-29 NOTE — Progress Notes (Deleted)
Pharmacy Note - renal dose adjustment  This 81 year old female has orders for famotidine  PO BID.   Estimated Creatinine Clearance: 22.2 mL/min (by C-G formula based on Cr of 1.71)..  Creatinine up from admission. Will adjust to famotidine  PO daily for CrCl < 34ml/min at this time.  Garlon Hatchet, PharmD Clinical Pharmacist 05/29/2015

## 2015-05-29 NOTE — Progress Notes (Addendum)
[facility]  Daily Progress Note   Subjective: 2 Days Post-Op: Abdominal aortogram, Right lower extremity distal runoff third order catheter placement and Crosser atherectomy right superficial femoral and popliteal arteries unsuccessful.  Patient states her leg feels "a little better today." Would like some excersizes she can do with her leg.   Objective: Filed Vitals:   05/28/15 1205 05/28/15 2251 05/29/15 0623 05/29/15 0826  BP: 116/51 142/96 125/57   Pulse: 89 78 82   Temp: 98.7 F (37.1 C) 98.2 F (36.8 C) 97.6 F (36.4 C)   TempSrc: Oral  Oral   Resp: 18  18   Height:      Weight:      SpO2: 100% 98% 93% 94%    Intake/Output Summary (Last 24 hours) at 05/29/15 0981 Last data filed at 05/29/15 0735  Gross per 24 hour  Intake    784 ml  Output    400 ml  Net    384 ml    Physical Exam: A&Ox3, NAD CV: RRR Pulmonary: CTA Bilaterally Abdomen: Soft, Nontender, Nondistended Vascular: Right Extremity: Thigh soft, calf edematous and cool with dependent rubor Left Extremity: Thigh soft, calf soft, foot warm.   Laboratory: CBC    Component Value Date/Time   WBC 11.7* 05/29/2015 0659   HGB 7.9* 05/29/2015 0659   HCT 24.3* 05/29/2015 0659   PLT 263 05/29/2015 0659    BMET    Component Value Date/Time   NA 140 05/28/2015 0513   K 3.9 05/28/2015 0513   CL 98* 05/28/2015 0513   CO2 31 05/28/2015 0513   GLUCOSE 272* 05/28/2015 0513   BUN 42* 05/28/2015 0513   CREATININE 1.71* 05/28/2015 0513   CALCIUM 8.0* 05/28/2015 0513   GFRNONAA 26* 05/28/2015 0513   GFRAA 30* 05/28/2015 0513    Assessment/Planning: 81 year old female s/p right lower extremity intervention for ischemic leg - unable to revascularize. 1) Patient may need AKA - not during this admission. Patient will decide in future as to when she will want to proceed with amputation based on pain level and quality of life. No indication at this time for amputation during his admission  (no infection etc). 2) Pain control 3) PT recs 4) Would recommend discharge to nursing home 5) Care as per primary team.   Cleda Daub PA-C 05/29/2015 9:23 AM

## 2015-05-29 NOTE — Progress Notes (Signed)
Waiting on patient to eat tray before rechecking sugar

## 2015-05-29 NOTE — Progress Notes (Signed)
Spoke with Dr. Hyacinth Meeker okay to place PT order for patient as well as order for xanax 0.25mg  Bid.

## 2015-05-29 NOTE — Progress Notes (Signed)
Hold BP meds per Dr. Nemiah Commander for low BP. Administer torsemide.

## 2015-05-29 NOTE — Evaluation (Signed)
Physical Therapy Evaluation Patient Details Name: [name] MRN: [id number] DOB: 09-26-1924 Today's Date: 05/29/2015   History of Present Illness  Pt presents w/ occulusion of artery in R leg. Pt has undergone a revascularization procdure but it was not successful. Pt has been made aware that an AKA may be necessary in the near future. Pt also currently has a fx of the R humerus that occured when she fell in her bathroom.   Clinical Impression  Upon evaluation, pt appears to be generally deconditioned and is in acute pain due to her humerus fx and RLE artery occlusion. Pt's R leg has some red discoloration starting 1/4 way down her shank that spreads into her foot and into toes. Pt is lacking all sensation in the R foot which decreases in severity above the ankle where she has partial sensation for light touch. Spoke with Dr. Gilda Crease prior to seeing pt and he cleared pt for full WB status on R LE with no activity restrictions. Capacity for light touch sensation increases as you move up the R leg. Pt is on 2 L of O2 and wheezes while breathing, which is made more apparent with light exertion. Monitored pt's O2 saturation throughout and values always stayed above 95%. Attempted to take patient through supine LE exercises to increase her level of mobility. Pt required frequent rest breaks due to shallow breathing and SOB during therapeutic exercises. Amount of activity performed today was limited due to patient's fatigue and pain levels. Pt requires skilled PT services in order to facilitate increases in functional mobility, increase strength, ROM, and teach compensatory strategies for mobility including DME training. Pt's WB status of R UE should be cleared with MD before attempting any ambulation with assistive devices.     Follow Up Recommendations SNF    Equipment Recommendations       Recommendations for Other Services       Precautions / Restrictions Precautions Precautions:  Fall Precaution Comments: pt presents with broken R humerus and complete lack of sensation, lack of ROM/strength and pain in her distal R LE putting her at an increased risk of falls with basic mobility tasks.  Required Braces or Orthoses: Other Brace/Splint Other Brace/Splint: R arm in full length splint and ace bandaged Restrictions Weight Bearing Restrictions: Yes RUE Weight Bearing: Non weight bearing RLE Weight Bearing: Weight bearing as tolerated      Mobility  Bed Mobility Overal bed mobility:  (bed mobility not assessed per pt stating that she did not feel up to it)                Transfers                    Ambulation/Gait                Stairs            Wheelchair Mobility    Modified Rankin (Stroke Patients Only)       Balance                                             Pertinent Vitals/Pain Pain Assessment: 0-10 Pain Score: 6  Pain Location: R arm and R leg Pain Intervention(s): Monitored during session    Home Living Family/patient expects to be discharged to:: Private residence Living Arrangements: Alone   Type of Home:  Independent living facility Home Access: Elevator     Home Layout: One level Home Equipment: Walker - 4 wheels      Prior Function Level of Independence: Independent with assistive device(s)         Comments: pt stated that before this occured she was independent with ambulating; however she only went short distances. Pt also states that she was able to dress and bathe herself before her fall.      Hand Dominance        Extremity/Trunk Assessment   Upper Extremity Assessment: RUE deficits/detail RUE Deficits / Details: immobilized  RUE: Unable to fully assess due to immobilization       Lower Extremity Assessment: RLE deficits/detail RLE Deficits / Details:  (Pt presents with RLE numbess which starts at mid shank level and continues into the foot where all feeling  for light touch is absent. Pt has limted ankle ROM for PF/DF and all other RLE motions are limited due to pain. )       Communication      Cognition Arousal/Alertness: Awake/alert;Lethargic (pt went in and out of being alert and falling asleep throuhgout the evaluation. ) Behavior During Therapy: WFL for tasks assessed/performed Overall Cognitive Status: Within Functional Limits for tasks assessed                      General Comments      Exercises Total Joint Exercises Ankle Circles/Pumps: AROM;Both (R ankle ROM PF/DF limited by swelling and lack of sensation and strength deficits L ankle strength atleast 3+/5 and R ankle at least 2+/5.) Quad Sets: AROM;Right;5 reps (pt became SOB after 5 repetitions) Heel Slides: AROM;Right;10 reps Hip ABduction/ADduction: AROM;10 reps Straight Leg Raises: AROM;5 reps;Right      Assessment/Plan    PT Assessment Patient needs continued PT services  PT Diagnosis Generalized weakness;Acute pain   PT Problem List Decreased strength;Decreased range of motion;Decreased activity tolerance;Decreased mobility;Impaired sensation;Decreased skin integrity;Pain  PT Treatment Interventions DME instruction;Gait training;Functional mobility training;Therapeutic activities;Therapeutic exercise;Balance training;Patient/family education   PT Goals (Current goals can be found in the Care Plan section) Acute Rehab PT Goals Patient Stated Goal: Pt wants to be able to move around again like she used to PT Goal Formulation: With patient Time For Goal Achievement: 06/12/15 Potential to Achieve Goals: Poor    Frequency Min 2X/week   Barriers to discharge        Co-evaluation               End of Session Equipment Utilized During Treatment: Oxygen (pt on 2L of 02 delivered via [place]) Activity Tolerance: Patient limited by fatigue;Patient limited by pain Patient left: in bed;with bed alarm set;with call bell/phone within reach;with nursing/sitter in  room Nurse Communication:  (pt's nurse made aware that she needed help eating her lunch )         Time: 1440-1503 PT Time Calculation (min) (ACUTE ONLY): 23 min   Charges:         PT G CodesGeorgina Peer 05/29/2015, 4:04 PM

## 2015-05-29 NOTE — Progress Notes (Addendum)
ANTICOAGULATION CONSULT NOTE - Initial Consult  Pharmacy Consult for heparin monitoring Indication: ischemic foot  Allergies  Allergen Reactions  . Dilaudid [Hydromorphone Hcl] Nausea And Vomiting    Pt experienced severe NV and constant dry heaves with no relief from the pain.   Marland Kitchen Morphine And Related Hives and Other (See Comments)    lightheaded    Patient Measurements: Height:  (147.3 cm) Weight: 205 lb 4.8 oz (93.123 kg) IBW/kg (Calculated) : 40.9  Vital Signs: Temp: 98.3 F (36.8 C) (09/15 1107) Temp Source: Oral (09/15 1107) BP: 104/80 mmHg (09/15 1107) Pulse Rate: 71 (09/15 1107)  Labs:  Recent Labs  05/27/15 0514  05/28/15 0513  05/28/15 2031 05/29/15 0659 05/29/15 1606  HGB 9.4*  --  7.9*  --   --  7.9*  --   HCT 28.1*  --  24.4*  --   --  24.3*  --   PLT 231  --  220  --   --  263  --   APTT  --   < >  --   < > 82* 138* 111*  CREATININE 1.40*  --  1.71*  --   --  3.20*  --   < > = values in this interval not displayed.  Estimated Creatinine Clearance: 11.9 mL/min (by C-G formula based on Cr of 3.2).  Assessment: Patient admitted with ischemic foot, started on heparin 15units/kg (1400 units/hr) yesterday at ~13:30. Discussed with PA this morning, pharmacy will follow heparin drip targeting aPTT of 60-90 seconds.  Goal of Therapy:  APTT 60-90 sec   Plan: Current orders for heparin 1350 units/hr, aPTT supratheraputic. Per heparin nomogram, will stop heparin infusion for 30 minutes and resume at decreased rate of 1250 units/hr and recheck aPTT in 6 hours.  9/15:  APTT @ 15:00 = 111 Will decrease gtt rate to 1150 units/hr and recheck aPTT 6 hrs after rate change on 9/16 @ 1:00.   Pharmacy to follow per consult  Garlon Hatchet, PharmD Clinical Pharmacist  05/29/2015 7:03 PM

## 2015-05-29 NOTE — Progress Notes (Signed)
Spoke with Dr. Hyacinth Meeker about patinet experiencing cramps 1 time order for potassium 20 mg and also order for magnesium  daily

## 2015-05-29 NOTE — Progress Notes (Signed)
Patient ID: [name], female   DOB: Apr 12, 1925, 81 y.o.   MRN: [id number] Patient ID: [name], female   DOB: Jun 09, 1925, 81 y.o.   MRN: [id number] [name] is a 81 y.o. female   SUBJECTIVE:  Patient merited with ischemic right leg, notes moderate pain there. Also has moderate pain in her right humerus. Moderate pain in shoulder, moderate right leg pain. Does not want to go to skilled nursing ______________________________________________________________________  ROS: Review of systems is unremarkable for any active cardiac,respiratory, GI, GU, hematologic, neurologic or psychiatric systems, 10 systems reviewed.  Marland Kitchen allopurinol  200 mg Oral Daily  . atorvastatin  20 mg Oral QHS  . bisoprolol-hydrochlorothiazide  1 tablet Oral Daily   And  . bisoprolol  5 mg Oral Daily  . cefTRIAXone (ROCEPHIN)  IV  1 g Intravenous Q24H  . docusate sodium  100 mg Oral BID  . escitalopram  5 mg Oral Daily  . famotidine  20 mg Oral BID  . insulin aspart  0-24 Units Subcutaneous 6 times per day  . insulin glargine  60 Units Subcutaneous QHS  . ipratropium-albuterol  3 mL Nebulization Q6H  . isosorbide mononitrate  60 mg Oral Daily  . losartan  100 mg Oral Daily  . magic mouthwash  5 mL Oral QID  . metolazone  2.5 mg Oral QODAY  . pantoprazole  40 mg Oral BID  . torsemide  20 mg Oral Daily   acetaminophen **OR** acetaminophen, diphenhydrAMINE, morphine injection, nitroGLYCERIN, ondansetron **OR** ondansetron (ZOFRAN) IV, oxyCODONE-acetaminophen, oxyCODONE-acetaminophen   Past Medical History  Diagnosis Date  . Cancer   . Blood transfusion without reported diagnosis   . CHF (congestive heart failure)   . Coronary artery disease   . Hypertension   . Shortness of breath dyspnea   . Diabetes mellitus without complication   . Peripheral vascular disease     Past Surgical History  Procedure Laterality Date  . Back surgery    . Coronary artery bypass graft    . Vascular surgery       PHYSICAL EXAM:  BP 125/57 mmHg  Pulse 82  Temp(Src) 97.6 F (36.4 C) (Oral)  Resp 18  Ht  (1.473 m)  Wt 93.123 kg (205 lb 4.8 oz)  BMI 42.92 kg/m2  SpO2 93%  Wt Readings from Last 3 Encounters:  05/28/15 93.123 kg (205 lb 4.8 oz)  05/18/15 92.534 kg (204 lb)           BP Readings from Last 3 Encounters:  05/29/15 125/57  05/18/15 156/56    Constitutional: NAD Neck: supple, no thyromegaly Respiratory: CTA, no rales or wheezes Cardiovascular: RRR, no murmur, no gallop Abdomen: soft, good BS, nontender Extremities: 2+ edema, right shoulder in sling Neuro: alert and oriented, no focal motor or sensory deficits  ASSESSMENT/PLAN:  Labs and imaging studies were reviewed  Sore throat-magic mouthwash  Diabetes mellitus- back to usual dosing, controlled Ischemic leg-IV heparin, not amenable to angioplasty, disposition pending  Right leg cellulitis-IV Rocephin, better Right humeral fracture-Percocet when necessary Chronic systolic congestive heart failure-follow closely, compensated currently, continue medication  anemia-follow closely, hemoglobin 7.7 CKD-half-normal saline, bicarbonate post angiogram, recheck a.m. Disposition pending

## 2015-05-30 ENCOUNTER — Encounter: Payer: Self-pay | Admitting: Vascular Surgery

## 2015-05-30 LAB — APTT
aPTT: 82 seconds — ABNORMAL HIGH (ref 24–36)
aPTT: 87 seconds — ABNORMAL HIGH (ref 24–36)
aPTT: 36 seconds (ref 24–36)

## 2015-05-30 LAB — CBC WITH DIFFERENTIAL/PLATELET
Basophils Absolute: 0 10*3/uL (ref 0–0.1)
Basophils Relative: 0 %
Eosinophils Absolute: 0.1 10*3/uL (ref 0–0.7)
Eosinophils Relative: 1 %
HEMATOCRIT: 22.5 % — AB (ref 35.0–47.0)
Hemoglobin: 7.3 g/dL — ABNORMAL LOW (ref 12.0–16.0)
LYMPHS ABS: 1.2 10*3/uL (ref 1.0–3.6)
LYMPHS PCT: 10 %
MCH: 29.3 pg (ref 26.0–34.0)
MCHC: 32.3 g/dL (ref 32.0–36.0)
MCV: 91 fL (ref 80.0–100.0)
MONO ABS: 1 10*3/uL — AB (ref 0.2–0.9)
MONOS PCT: 8 %
NEUTROS ABS: 9.2 10*3/uL — AB (ref 1.4–6.5)
Neutrophils Relative %: 81 %
Platelets: 243 10*3/uL (ref 150–440)
RBC: 2.48 MIL/uL — ABNORMAL LOW (ref 3.80–5.20)
RDW: 16.1 % — AB (ref 11.5–14.5)
WBC: 11.4 10*3/uL — ABNORMAL HIGH (ref 3.6–11.0)

## 2015-05-30 LAB — GLUCOSE, CAPILLARY
GLUCOSE-CAPILLARY: 86 mg/dL (ref 65–99)
GLUCOSE-CAPILLARY: 83 mg/dL (ref 65–99)
GLUCOSE-CAPILLARY: 95 mg/dL (ref 65–99)
Glucose-Capillary: 48 mg/dL — ABNORMAL LOW (ref 65–99)
Glucose-Capillary: 60 mg/dL — ABNORMAL LOW (ref 65–99)
Glucose-Capillary: 55 mg/dL — ABNORMAL LOW (ref 65–99)
Glucose-Capillary: 57 mg/dL — ABNORMAL LOW (ref 65–99)
Glucose-Capillary: 63 mg/dL — ABNORMAL LOW (ref 65–99)
Glucose-Capillary: 48 mg/dL — ABNORMAL LOW (ref 65–99)
Glucose-Capillary: 59 mg/dL — ABNORMAL LOW (ref 65–99)
Glucose-Capillary: 61 mg/dL — ABNORMAL LOW (ref 65–99)
Glucose-Capillary: 81 mg/dL (ref 65–99)

## 2015-05-30 LAB — BASIC METABOLIC PANEL
ANION GAP: 10 (ref 5–15)
BUN: 51 mg/dL — AB (ref 6–20)
CALCIUM: 7.6 mg/dL — AB (ref 8.9–10.3)
CO2: 29 mmol/L (ref 22–32)
CREATININE: 3.07 mg/dL — AB (ref 0.44–1.00)
Chloride: 97 mmol/L — ABNORMAL LOW (ref 101–111)
GFR calc Af Amer: 15 mL/min — ABNORMAL LOW (ref >60)
GFR calc non Af Amer: 13 mL/min — ABNORMAL LOW (ref >60)
GLUCOSE: 62 mg/dL — AB (ref 65–99)
Potassium: 4.3 mmol/L (ref 3.5–5.1)
Sodium: 136 mmol/L (ref 135–145)

## 2015-05-30 MED ORDER — ACETYLCYSTEINE 20 % IN SOLN
600.0000 mg | Freq: Two times a day (BID) | RESPIRATORY_TRACT | Status: DC
Start: 2015-05-30 — End: 2015-05-31
  Administered 2015-05-30 – 2015-05-31 (×2): 600 mg via ORAL
  Filled 2015-05-30 (×4): qty 4

## 2015-05-30 MED ORDER — DEXTROSE-NACL 5-0.45 % IV SOLN
INTRAVENOUS | Status: DC
  Administered 2015-05-30: 08:00:00 via INTRAVENOUS

## 2015-05-30 MED ORDER — INSULIN GLARGINE 100 UNIT/ML ~~LOC~~ SOLN
20.0000 [IU] | Freq: Every day | SUBCUTANEOUS | Status: DC
  Filled 2015-05-30 (×2): qty 0.2

## 2015-05-30 MED ORDER — SODIUM CHLORIDE 0.9 % IV BOLUS (SEPSIS)
500.0000 mL | Freq: Once | INTRAVENOUS | Status: AC
  Administered 2015-05-30: 500 mL via INTRAVENOUS

## 2015-05-30 MED ORDER — DEXTROSE 10 % IV SOLN
INTRAVENOUS | Status: DC
  Administered 2015-05-30: 11:00:00 via INTRAVENOUS

## 2015-05-30 MED ORDER — HEPARIN BOLUS VIA INFUSION
1000.0000 [IU] | Freq: Once | INTRAVENOUS | Status: AC
  Administered 2015-05-31: 1000 [IU] via INTRAVENOUS
  Filled 2015-05-30: qty 1000

## 2015-05-30 NOTE — Progress Notes (Signed)
Patient ID: [name], female   DOB: 1925-02-28, 81 y.o.   MRN: [id number] Patient ID: [name], female   DOB: 17-May-1925, 81 y.o.   MRN: [id number] Patient ID: [name], female   DOB: Jun 08, 1925, 81 y.o.   MRN: [id number] [name] is a 81 y.o. female   SUBJECTIVE:  Patient admitted with ischemic right leg. Moderate pain in shoulder, moderate right leg pain. Sleeping this morning. ______________________________________________________________________  ROS: Review of systems is unremarkable for any active cardiac,respiratory, GI, GU, hematologic, neurologic or psychiatric systems, 10 systems reviewed.  . ALPRAZolam  0.25 mg Oral BID  . atorvastatin  20 mg Oral QHS  . bisoprolol  5 mg Oral Daily  . cefTRIAXone (ROCEPHIN)  IV  1 g Intravenous Q24H  . docusate sodium  100 mg Oral BID  . escitalopram  5 mg Oral Daily  . famotidine  20 mg Oral BID  . insulin aspart  0-24 Units Subcutaneous 6 times per day  . insulin glargine  40 Units Subcutaneous QHS  . magic mouthwash  5 mL Oral QID  . magnesium oxide  400 mg Oral Daily  . pantoprazole  40 mg Oral BID   acetaminophen **OR** acetaminophen, diphenhydrAMINE, ipratropium-albuterol, morphine injection, nitroGLYCERIN, ondansetron **OR** ondansetron (ZOFRAN) IV, oxyCODONE-acetaminophen, oxyCODONE-acetaminophen   Past Medical History  Diagnosis Date  . Cancer   . Blood transfusion without reported diagnosis   . CHF (congestive heart failure)   . Coronary artery disease   . Hypertension   . Shortness of breath dyspnea   . Diabetes mellitus without complication   . Peripheral vascular disease     Past Surgical History  Procedure Laterality Date  . Back surgery    . Coronary artery bypass graft    . Vascular surgery      PHYSICAL EXAM:  BP 110/54 mmHg  Pulse 81  Temp(Src) 97.8 F (36.6 C) (Oral)  Resp 18  Ht  (1.473 m)  Wt 96.435 kg (212 lb 9.6 oz)  BMI 44.45 kg/m2  SpO2 100%  Wt Readings from Last 3  Encounters:  05/30/15 96.435 kg (212 lb 9.6 oz)  05/18/15 92.534 kg (204 lb)           BP Readings from Last 3 Encounters:  05/30/15 110/54  05/18/15 156/56    Constitutional: NAD Neck: supple, no thyromegaly Respiratory: CTA, no rales or wheezes Cardiovascular: RRR, 2/6 murmur, no gallop Abdomen: soft, good BS, nontender Extremities: no edema, right shoulder in sling Neuro:Somnolent, no focal motor or sensory deficits  ASSESSMENT/PLAN:  Labs and imaging studies were reviewed  Acute on chronic renal failure-creatinine up to 3.2 yesterday, pending this morning, nephrology consult, post IV contrast dye, holding blood pressure medicines, hold diuretics at this point   Sore throat-magic mouthwash  Diabetes mellitus- with progressive renal dysfunction, add D5 to avoid hypoglycemia, lower insulin dose  Ischemic leg-IV heparin, not amenable to angioplasty, disposition pending  Right leg cellulitis-IV Rocephin, better  Right humeral fracture-Percocet when necessary  Chronic systolic congestive heart failure -follow very closely off diuretics anemia-follow closely, hemoglobin 7.7    disposition regarding leg per vascular surgery

## 2015-05-30 NOTE — Progress Notes (Signed)
[facility]  Daily Progress Note  Subjective: 3 Days Post-Op: Abdominal aortogram, Right lower extremity distal runoff third order catheter placement and Crosser atherectomy right superficial femoral and popliteal arteries unsuccessful.  Spoke with patient, daughter and son (who was on phone) about plan. We discussed how there is no indication for amputation at this time during this hospitalization. The patient will decide when she is ready or if her extremity decompensates and an amputation becomes necessary. We discussed placement to a SNF - PT, our team, the patient and her family are all in agreement that would be the best place for discharge.   Patient still with right lower extremity discomfort however pain is controlled through PO pain meds at this time. Patient motivated to work with PT.   Objective: Filed Vitals:   05/29/15 2048 05/30/15 0446 05/30/15 0455 05/30/15 1239  BP: 112/90 110/54  112/57  Pulse: 73 81  67  Temp: 98.2 F (36.8 C) 97.8 F (36.6 C)  98 F (36.7 C)  TempSrc: Oral Oral    Resp: 18   18  Height:      Weight:   96.435 kg (212 lb 9.6 oz)   SpO2: 100% 100%  100%    Intake/Output Summary (Last 24 hours) at 05/30/15 1408 Last data filed at 05/30/15 1315  Gross per 24 hour  Intake   1253 ml  Output    400 ml  Net    853 ml    Physical Exam: A&Ox3, NAD CV: RRR Pulmonary: CTA Bilaterally Abdomen: Soft, Nontender, Nondistended Vascular: Right Extremity: Thigh soft, calf edematous and cool with dependent rubor Left Extremity: Thigh soft, calf soft, foot warm.   Laboratory: CBC    Component Value Date/Time   WBC 11.4* 05/30/2015 0627   HGB 7.3* 05/30/2015 0627   HCT 22.5* 05/30/2015 0627   PLT 243 05/30/2015 0627    BMET    Component Value Date/Time   NA 136 05/30/2015 0627   K 4.3 05/30/2015 0627   CL 97* 05/30/2015 0627   CO2 29 05/30/2015 0627   GLUCOSE 62* 05/30/2015 0627   BUN 51* 05/30/2015 0627   CREATININE  3.07* 05/30/2015 0627   CALCIUM 7.6* 05/30/2015 0627   GFRNONAA 13* 05/30/2015 0627   GFRAA 15* 05/30/2015 0627    Assessment/Planning: 81 year old female s/p right lower extremity intervention for ischemic leg - unable to revascularize. 1) Patient may need AKA - not during this admission. Patient will decide in future as to when she will want to proceed with amputation based on pain level and quality of life. No indication at this time for amputation during his admission (no infection etc). 2) Pain control 3) Consults out to social work and case management for placement to SNF.   Cleda Daub PA-C 05/30/2015 2:08 PM

## 2015-05-30 NOTE — Progress Notes (Signed)
Inpatient Diabetes Program Recommendations  AACE/ADA: New Consensus Statement on Inpatient Glycemic Control (2015)  Target Ranges:  Prepandial:   less than 140 mg/dL      Peak postprandial:   less than 180 mg/dL (1-2 hours)      Critically ill patients:  140 - 180 mg/dL   Results for [name], [name] (MRN [id number]) as of 05/30/2015 10:34  Ref. Range 05/29/2015 04:46 05/29/2015 07:41 05/29/2015 11:25 05/29/2015 16:28 05/29/2015 17:24 05/29/2015 20:52 05/29/2015 23:57 05/30/2015 04:31 05/30/2015 08:01 05/30/2015 08:29 05/30/2015 09:50  Glucose-Capillary Latest Ref Range: 65-99 mg/dL 409 (H) 811 (H) 73 61 (L) 85 111 (H) 116 (H) 86 48 (L) 60 (L) 83   Review of Glycemic Control  Current orders for Inpatient glycemic control: Lantus 40 units QHS, Novolog 0-24 units Q4H  Inpatient Diabetes Program Recommendations: Insulin - Basal: Noted glucose 48 mg/dl this morning at 9:14 am and Lantus was decreased from 60 units to 40 units. May need to increase Lantus if CBGs are elevated with decrease in Lantus. Will need to follow trends to determine needs for basal insulin.  Thanks, Orlando Penner, RN, MSN, CCRN, CDE Diabetes Coordinator Inpatient Diabetes Program [phone] (Team Pager from 8am to 5pm) [phone] ([facility] office) [phone] [facility] office) [phone] [facility] office)

## 2015-05-30 NOTE — Progress Notes (Signed)
Initial Nutrition Assessment    INTERVENTION:  Meals and snacks: Cater to pt preferences Medical Nutrition Supplement: Recommend no sugar added mightyshake TID for added nutrition  NUTRITION DIAGNOSIS:   Inadequate oral intake related to acute illness as evidenced by per patient/family report.    GOAL:   Patient will meet greater than or equal to 90% of their needs    MONITOR:    (Energy intake, Glucose profile)  REASON FOR ASSESSMENT:   LOS    ASSESSMENT:      Pt admitted with ARF, ischemic right leg, s/p abdomen aortogram, right leg cellulitis, right humeral fracture  Past Medical History  Diagnosis Date  . Cancer   . Blood transfusion without reported diagnosis   . CHF (congestive heart failure)   . Coronary artery disease   . Hypertension   . Shortness of breath dyspnea   . Diabetes mellitus without complication   . Peripheral vascular disease     Current Nutrition: eating bites since admission for the past 4 days.  Food/Nutrition-Related History: Dtr reports poor po intake for about 2 weeks prior to admission secondary to arm fracture   Medications: colace, aspart, lantus, KCL  Electrolyte/Renal Profile and Glucose Profile:   Recent Labs Lab 05/26/15 1242 05/27/15 0514 05/28/15 0513 05/29/15 0659 05/30/15 0627  NA 138 139 140 139 136  K 3.4* 3.6 3.9 4.1 4.3  CL 92* 98* 98* 98* 97*  CO2 34* 32 31 32 29  BUN 45* 45* 42* 52* 51*  CREATININE 1.40* 1.40* 1.71* 3.20* 3.07*  CALCIUM 8.4* 7.9* 8.0* 7.9* 7.6*  MG 2.5* 2.3  --   --   --   PHOS 3.5  --   --   --   --   GLUCOSE 142* 124* 272* 91 62*   Protein Profile:   Recent Labs Lab 05/26/15 1242 05/29/15 0659  ALBUMIN 2.9* 2.4*    Gastrointestinal Profile: Last BM:9/13   Nutrition-Focused Physical Exam Findings:  Unable to complete Nutrition-Focused physical exam at this time.  Pt sleeping unable to perform exam    Weight Change: no wt change per dtr    Diet Order:  Diet  heart healthy/carb modified Room service appropriate?: Yes; Fluid consistency:: Thin  Skin:   reviewed   Height:   Ht Readings from Last 1 Encounters:  05/27/15  (1.473 m)    Weight:   Wt Readings from Last 1 Encounters:  05/30/15 212 lb 9.6 oz (96.435 kg)     BMI:  Body mass index is 44.45 kg/(m^2).  Estimated Nutritional Needs:   Kcal:  Using IBW 44 kg.  [id number] kcals (IF 1.0-1.2, AF 1.2) [phone] kcals/d   Protein:  (1.1-1.3 g/kg) 48-57 g/d  Fluid:  (30-78ml/kg) 1320-1571ml/d  EDUCATION NEEDS:   No education needs identified at this time  MODERATE Care Level  Joli B. Freida Busman, RD, LDN [phone] (pager)

## 2015-05-30 NOTE — Progress Notes (Signed)
Nurse tech in room and found pt's oxygen was off. Pt's O2 78%. Tech notified nurse. Oxygen placed back on pt and O2 remained between 78-82% on 2L. O2 increased to 4L. O2 now at 98% on 4L. Will continue to monitor.

## 2015-05-30 NOTE — Care Management Important Message (Signed)
Important Message  Patient Details  Name: [name] MRN: [id number] Date of Birth: [birth year]   Medicare Important Message Given:  Yes-third notification given    [name] 05/30/2015, 11:29 AM

## 2015-05-30 NOTE — Progress Notes (Signed)
ANTICOAGULATION CONSULT NOTE - Initial Consult  Pharmacy Consult for heparin monitoring Indication: ischemic foot  Allergies  Allergen Reactions  . Dilaudid [Hydromorphone Hcl] Nausea And Vomiting    Pt experienced severe NV and constant dry heaves with no relief from the pain.   Marland Kitchen Morphine And Related Hives and Other (See Comments)    lightheaded    Patient Measurements: Height:  (147.3 cm) Weight: 205 lb 4.8 oz (93.123 kg) IBW/kg (Calculated) : 40.9  Vital Signs: Temp: 98.2 F (36.8 C) (09/15 2048) Temp Source: Oral (09/15 2048) BP: 112/90 mmHg (09/15 2048) Pulse Rate: 73 (09/15 2048)  Labs:  Recent Labs  05/27/15 0514  05/28/15 0513  05/29/15 0659 05/29/15 1606 05/30/15 0055  HGB 9.4*  --  7.9*  --  7.9*  --   --   HCT 28.1*  --  24.4*  --  24.3*  --   --   PLT 231  --  220  --  263  --   --   APTT  --   < >  --   < > 138* 111* 82*  CREATININE 1.40*  --  1.71*  --  3.20*  --   --   < > = values in this interval not displayed.  Estimated Creatinine Clearance: 11.9 mL/min (by C-G formula based on Cr of 3.2).  Assessment: Patient admitted with ischemic foot, started on heparin 15units/kg (1400 units/hr) yesterday at ~13:30. Discussed with PA this morning, pharmacy will follow heparin drip targeting aPTT of 60-90 seconds.  Goal of Therapy:  APTT 60-90 sec   Plan: Current orders for heparin 1350 units/hr, aPTT supratheraputic. Per heparin nomogram, will stop heparin infusion for 30 minutes and resume at decreased rate of 1250 units/hr and recheck aPTT in 6 hours.  9/15:  APTT @ 15:00 = 111 Will decrease gtt rate to 1150 units/hr and recheck aPTT 6 hrs after rate change on 9/16 @ 1:00.    0916 0055 aPTT therapeutic. Will confirm level in 6 hours.   Pharmacy to follow per consult  Harrold Donath A. Dahlia Bailiff, PharmD Clinical Pharmacist  05/30/2015 1:59 AM

## 2015-05-30 NOTE — Progress Notes (Signed)
Physical Therapy Treatment Patient Details Name: [name] MRN: [id number] DOB: 02-Apr-1925 Today's Date: 05/30/2015    History of Present Illness Pt presents w/ occulusion of artery in R leg. Pt has undergone a revascularization procdure but it was not successful. Pt has been made aware that an AKA may be necessary in the near future. Pt also currently has a fx of the R humerus that occured when she fell in her bathroom.     PT Comments    Pt states that she is having an increased difficulty breathing today and that the pain in her R LE has gotten worse since being seen yesterday. Pt was unable to perform any significant AROM of her R LE today due to the severity of her pain. Pt was agreeable to get to sitting of EOB today so pt was transferred mod assist +2 to the EOB with pt helping to control her trunk and unaffected arm and leg (both L). Pt remained seated on EOB for ~5 mins in order to facilitate lung hygiene and work on core strengthening. Pt was mod assist +2 for maintenance of seated balance and tended to fall posteriorly and Laterally to the L if support was taken away. Pt still requires skilled PT services in order to maintain and increase her strength and AROM in her L LE and improve her functional mobility.   Follow Up Recommendations  SNF     Equipment Recommendations       Recommendations for Other Services       Precautions / Restrictions Precautions Precautions: Fall Precaution Comments: pt presents with broken R humerus and complete lack of sensation, lack of ROM/strength and pain in her distal R LE putting her at an increased risk of falls with basic mobility tasks.  Required Braces or Orthoses: Other Brace/Splint Other Brace/Splint: R arm in full length splint and ace bandaged Restrictions Weight Bearing Restrictions: Yes RUE Weight Bearing: Non weight bearing RLE Weight Bearing: Weight bearing as tolerated    Mobility  Bed Mobility Overal bed mobility: +2  for physical assistance             General bed mobility comments: pt was mod assist +2 for getting from supine to seated on EOB; one therapist controlling shoulders in order to limit discomfort of R arm fx and one therapist controlling legs for pain control of R LE. sitting to supine is also +2 assist.   Transfers                    Ambulation/Gait                 Stairs            Wheelchair Mobility    Modified Rankin (Stroke Patients Only)       Balance Overall balance assessment: Needs assistance Sitting-balance support: Single extremity supported;Feet unsupported Sitting balance-Leahy Scale: Poor Sitting balance - Comments: Pt was able to maintain seated balance by supporting herself with her L UE on the bed rail/bed and mod assist +2 for control of her upper body. Pt would fall posteriorly and to her L if upper body support was taken away.   Postural control: Posterior lean;Left lateral lean                          Cognition Arousal/Alertness: Awake/alert;Lethargic (went in and out of being alert and lethargic throughout entire session) Behavior During Therapy: [facility] for tasks assessed/performed  Overall Cognitive Status: Within Functional Limits for tasks assessed                      Exercises Total Joint Exercises Ankle Circles/Pumps: AROM;Both;Left (pt unable to perform much AROM of R ankle, limited by pain and swelling. ) Other Exercises Other Exercises: supine to sitting on EOB transfer mod assist + 2; sitting balance practice on EOB x 5 mins with mod assit +2 for maintanence of balance    General Comments        Pertinent Vitals/Pain Pain Assessment:  (pt complains of severe pain in her R leg today but did not rate numerically) Pain Location: R leg more than her R arm Pain Intervention(s): Monitored during session    Home Living                      Prior Function            PT Goals (current goals  can now be found in the care plan section) Acute Rehab PT Goals Patient Stated Goal: Pt wants to be able to move around again like she used to PT Goal Formulation: With patient Time For Goal Achievement: 06/12/15 Potential to Achieve Goals: Fair Progress towards PT goals: Progressing toward goals    Frequency  Min 2X/week    PT Plan      Co-evaluation             End of Session Equipment Utilized During Treatment: Oxygen Activity Tolerance: Patient limited by fatigue;Patient limited by pain Patient left: in bed;with bed alarm set;with family/visitor present (with pillow under calves and heels elevated off of pillow for pressure relief. daughter in her room with her. )     Time: 4540-9811 PT Time Calculation (min) (ACUTE ONLY): 26 min  Charges:                       G CodesGeorgina Peer 05/30/2015, 4:38 PM

## 2015-05-30 NOTE — Clinical Social Work Note (Signed)
Clinical Social Work Assessment  Patient Details  Name: [name] MRN: [id number] Date of Birth: 11-Dec-1924  Date of referral:  05/30/15               Reason for consult:  Facility Placement                Permission sought to share information with:  Family Supports Permission granted to share information::  Yes, Verbal Permission Granted  Name::        Agency::     Relationship::     Contact Information:     Housing/Transportation Living arrangements for the past 2 months:  Single Family Home Source of Information:  Adult Children Patient Interpreter Needed:  None Criminal Activity/Legal Involvement Pertinent to Current Situation/Hospitalization:  No - Comment as needed Significant Relationships:  Adult Children Lives with:    Do you feel safe going back to the place where you live?  Yes Need for family participation in patient care:  Yes (Comment)  Care giving concerns:  Patient had been residing alone and independently prior to a couple of weeks coming into the hospital.    Social Worker assessment / plan:  CSW informed by PT this morning that they recommend rehab placement for patient. Until this afternoon, it was undetermined what was going to be the treatment plan for patient's leg. CSW spoke with patient's daughter, [name] [phone]), outside of patient's room due to patient requesting that we step outside to talk. Patient's daughter states that they are going to leave the leg as is for now and that they do want rehab for patient at discharge. CSW has initiated a bedsearch and will await bed offers. Chelsea, the vascular PA, stated that there is no indication currently for an amputation but that depending on patient's quality of life and pain, patient may opt for the amputation sooner than later. This also may hinder her ability to rehab as well.  Employment status:  Retired Database administrator PT Recommendations:  Skilled Nursing  Facility Information / Referral to community resources:  Skilled Nursing Facility  Patient/Family's Response to care:  Patient was anxious and requested we step outside to discuss her plan of care.  Patient/Family's Understanding of and Emotional Response to Diagnosis, Current Treatment, and Prognosis:  Patient's daughter was rapid in her speech and scribbled on a pad while we were talking. Patient's daughter stated that there was no need for an amputation of her mother's leg currently and that they want her to do rehab with the leg as it is currently. Unsure if patient's daughter understands the implications and limitations of patient's ability for rehab.   Emotional Assessment Appearance:  Appears stated age Attitude/Demeanor/Rapport:   (anxious) Affect (typically observed):  Anxious Orientation:  Oriented to Self, Oriented to Place, Oriented to  Time, Oriented to Situation Alcohol / Substance use:  Not Applicable Psych involvement (Current and /or in the community):  No (Comment)  Discharge Needs  Concerns to be addressed:  Care Coordination Readmission within the last 30 days:  No Current discharge risk:  None Barriers to Discharge:  No Barriers Identified   York Spaniel, LCSW 05/30/2015, 2:29 PM

## 2015-05-30 NOTE — Progress Notes (Signed)
ANTICOAGULATION CONSULT NOTE - Initial Consult  Pharmacy Consult for heparin monitoring Indication: ischemic foot  Allergies  Allergen Reactions  . Dilaudid [Hydromorphone Hcl] Nausea And Vomiting    Pt experienced severe NV and constant dry heaves with no relief from the pain.   Marland Kitchen Morphine And Related Hives and Other (See Comments)    lightheaded    Patient Measurements: Height:  (147.3 cm) Weight: 212 lb 9.6 oz (96.435 kg) IBW/kg (Calculated) : 40.9  Vital Signs: Temp: 98.6 F (37 C) (09/16 2115) Temp Source: Oral (09/16 2115) BP: 104/72 mmHg (09/16 2231) Pulse Rate: 64 (09/16 2231)  Labs:  Recent Labs  05/28/15 0513  05/29/15 0659  05/30/15 0055 05/30/15 0627 05/30/15 2245  HGB 7.9*  --  7.9*  --   --  7.3*  --   HCT 24.4*  --  24.3*  --   --  22.5*  --   PLT 220  --  263  --   --  243  --   APTT  --   < > 138*  < > 82* 87* 36  CREATININE 1.71*  --  3.20*  --   --  3.07*  --   < > = values in this interval not displayed.  Estimated Creatinine Clearance: 12.6 mL/min (by C-G formula based on Cr of 3.07).  Assessment: Patient admitted with ischemic foot, started on heparin 15units/kg (1400 units/hr) yesterday at ~13:30. Discussed with PA this morning, pharmacy will follow heparin drip targeting aPTT of 60-90 seconds.  Goal of Therapy:  APTT 60-90 sec   Plan: Current orders for heparin 1350 units/hr, aPTT supratheraputic. Per heparin nomogram, will stop heparin infusion for 30 minutes and resume at decreased rate of 1250 units/hr and recheck aPTT in 6 hours.  9/15:  APTT @ 15:00 = 111 Will decrease gtt rate to 1150 units/hr and recheck aPTT 6 hrs after rate change on 9/16 @ 1:00.    0916 0055 aPTT therapeutic. Will confirm level in 6 hours.   0916 0700 aPTT therapeutic, will continue current rate at 1150 units/hr.  Recheck aPTT with AM labs.  [id number] aPTT subtherapeutic, talked to [employer] who said drip was not running when she came in. Ordered bolus  1000 units x 1 and restart drip at last rate 1150 units/hr. Will follow up in 6 hours.   Pharmacy to follow per consult  Harrold Donath A. Dahlia Bailiff, PharmD Clinical Pharmacist  05/30/2015 11:41 PM

## 2015-05-30 NOTE — Progress Notes (Signed)
ANTICOAGULATION CONSULT NOTE - Initial Consult  Pharmacy Consult for heparin monitoring Indication: ischemic foot  Allergies  Allergen Reactions  . Dilaudid [Hydromorphone Hcl] Nausea And Vomiting    Pt experienced severe NV and constant dry heaves with no relief from the pain.   Marland Kitchen Morphine And Related Hives and Other (See Comments)    lightheaded    Patient Measurements: Height:  (147.3 cm) Weight: 212 lb 9.6 oz (96.435 kg) IBW/kg (Calculated) : 40.9  Vital Signs: Temp: 97.8 F (36.6 C) (09/16 0446) Temp Source: Oral (09/16 0446) BP: 110/54 mmHg (09/16 0446) Pulse Rate: 81 (09/16 0446)  Labs:  Recent Labs  05/28/15 0513  05/29/15 0659 05/29/15 1606 05/30/15 0055 05/30/15 0627  HGB 7.9*  --  7.9*  --   --  7.3*  HCT 24.4*  --  24.3*  --   --  22.5*  PLT 220  --  263  --   --  243  APTT  --   < > 138* 111* 82* 87*  CREATININE 1.71*  --  3.20*  --   --  3.07*  < > = values in this interval not displayed.  Estimated Creatinine Clearance: 12.6 mL/min (by C-G formula based on Cr of 3.07).  Assessment: Patient admitted with ischemic foot, started on heparin 15units/kg (1400 units/hr) yesterday at ~13:30. Discussed with PA this morning, pharmacy will follow heparin drip targeting aPTT of 60-90 seconds.  Goal of Therapy:  APTT 60-90 sec   Plan: Current orders for heparin 1350 units/hr, aPTT supratheraputic. Per heparin nomogram, will stop heparin infusion for 30 minutes and resume at decreased rate of 1250 units/hr and recheck aPTT in 6 hours.  9/15:  APTT @ 15:00 = 111 Will decrease gtt rate to 1150 units/hr and recheck aPTT 6 hrs after rate change on 9/16 @ 1:00.    0916 0055 aPTT therapeutic. Will confirm level in 6 hours.   0916 0700 aPTT therapeutic, will continue current rate at 1150 units/hr.  Recheck aPTT with AM labs.  Pharmacy to follow per consult  Maryjo Rochester, PharmD Clinical Pharmacist  05/30/2015 10:24 AM

## 2015-05-30 NOTE — Consult Note (Signed)
Date: 05/30/2015                  Patient Name:  [name]  MRN: [id number]  DOB: 07-02-1925  Age / Sex: 81 y.o., female         PCP: Danella Penton., MD                 Service Requesting Consult:  internal medicine, vascular surgery                  Reason for Consult:  acute renal failure             History of Present Illness: Patient is a 81 y.o. female with medical problems of diabetes, coronary disease, CABG 20 years ago, congestive heart failure, diabetes, peripheral vascular disease, right humeral fracture, who was admitted to [facility] on 05/26/2015 for evaluation of right cold Extremity.  On Sep 13, Patient underwent abdominal aortogram, atherectomy of right superficial femoral and popliteal arteries. Part of the procedure was unsuccessful, above the knee amputation was recommended. 95 cc of IV contrast was used. She is currently also being treated for right leg cellulitis.  Patient's baseline creatinine is 1.40 as noted on September 13.  Patient's creatinine has progressively increased over the course of hospitalization and peaked at 3.20. Today's creatinine is slightly lower at 3.07. Increase in AST and ALT was also noted  Today patient's main concern is some shortness of breath. She denies any chest pain. Did not report any pain in the leg at the present time.   Medications: Outpatient medications: Prescriptions prior to admission  Medication Sig Dispense Refill Last Dose  . allopurinol (ZYLOPRIM) 100 MG tablet Take 200 mg by mouth daily.   05/26/2015 at Unknown time  . aspirin EC 81 MG tablet Take 81 mg by mouth daily.   05/26/2015 at Unknown time  . atorvastatin (LIPITOR) 40 MG tablet Take 20 mg by mouth at bedtime.    05/25/2015 at Unknown time  . azelastine (ASTELIN) 0.1 % nasal spray Place 1 spray into both nostrils as needed for rhinitis. Use in each nostril as directed   PRN at PRN  . bisoprolol-hydrochlorothiazide (ZIAC) 10-6.25 MG per tablet Take 1 tablet by mouth  2 (two) times daily.   05/26/2015 at Unknown time  . clopidogrel (PLAVIX) 75 MG tablet Take 75 mg by mouth daily.   05/26/2015 at Unknown time  . fexofenadine (ALLEGRA) 60 MG tablet Take 60 mg by mouth as needed for allergies or rhinitis.   PRN at PRN  . insulin glargine (LANTUS) 100 UNIT/ML injection Inject 60 Units into the skin at bedtime.   05/25/2015 at Unknown time  . insulin lispro (HUMALOG) 100 UNIT/ML injection Inject 30 Units into the skin 3 (three) times daily before meals.   05/26/2015 at Unknown time  . isosorbide mononitrate (IMDUR) 60 MG 24 hr tablet Take 60 mg by mouth daily.   05/26/2015 at Unknown time  . losartan-hydrochlorothiazide (HYZAAR) 100-25 MG per tablet Take 1 tablet by mouth daily.   05/26/2015 at Unknown time  . metolazone (ZAROXOLYN) 2.5 MG tablet Take 2.5 mg by mouth every other day.     . Multiple Vitamin (MULTIVITAMIN) tablet Take 1 tablet by mouth daily.   05/26/2015 at Unknown time  . nitroGLYCERIN (NITRODUR - DOSED IN MG/24 HR) 0.4 mg/hr patch Place 0.4 mg onto the skin daily.   PRN at PRN  . omeprazole (PRILOSEC) 20 MG capsule Take 20 mg by mouth  daily as needed.   PRN at PRN  . oxyCODONE-acetaminophen (PERCOCET/ROXICET) 5-325 MG per tablet Take 1 tablet by mouth 3 (three) times daily as needed for severe pain.   05/26/2015 at 0800  . potassium chloride SA (K-DUR,KLOR-CON) 20 MEQ tablet Take 40 mEq by mouth daily.   05/26/2015 at Unknown time  . torsemide (DEMADEX) 20 MG tablet Take 20 mg by mouth daily.   05/26/2015 at Unknown time  . HYDROcodone-acetaminophen (NORCO/VICODIN) 5-325 MG per tablet Take 1 tablet by mouth every 6 (six) hours as needed.  0   . promethazine (PHENERGAN) 12.5 MG tablet Take 1 tablet (12.5 mg total) by mouth every 8 (eight) hours as needed for nausea or vomiting. 21 tablet 0 prn at prn    Current medications: Current Facility-Administered Medications  Medication Dose Route Frequency Provider Last Rate Last Dose  . acetaminophen (TYLENOL)  tablet 650 mg  650 mg Oral Q6H PRN Kimberly A Stegmayer, PA-C       Or  . acetaminophen (TYLENOL) suppository 650 mg  650 mg Rectal Q6H PRN Ranae Plumber Stegmayer, PA-C      . ALPRAZolam Prudy Feeler) tablet 0.25 mg  0.25 mg Oral BID Danella Penton, MD   0.25 mg at 05/29/15 2225  . bisoprolol (ZEBETA) tablet 5 mg  5 mg Oral Daily Renford Dills, MD   5 mg at 05/27/15 1913  . cefTRIAXone (ROCEPHIN) 1 g in dextrose 5 % 50 mL IVPB  1 g Intravenous Q24H Shaune Pollack, MD   1 g at 05/29/15 1531  . dextrose 10 % infusion   Intravenous Continuous Harmeet Singh, MD      . diphenhydrAMINE (BENADRYL) injection 12.5 mg  12.5 mg Intravenous Q6H PRN Renford Dills, MD      . docusate sodium (COLACE) capsule 100 mg  100 mg Oral BID Ranae Plumber Stegmayer, PA-C   100 mg at 05/29/15 2225  . escitalopram (LEXAPRO) tablet 5 mg  5 mg Oral Daily Kimberly A Stegmayer, PA-C   5 mg at 05/29/15 1109  . famotidine (PEPCID) tablet 20 mg  20 mg Oral BID Ranae Plumber Stegmayer, PA-C   20 mg at 05/29/15 2225  . heparin ADULT infusion 100 units/mL (25000 units/250 mL)  1,150 Units/hr Intravenous Continuous Renford Dills, MD 11.5 mL/hr at 05/29/15 1917 1,150 Units/hr at 05/29/15 1917  . insulin aspart (novoLOG) injection 0-24 Units  0-24 Units Subcutaneous 6 times per day Tonette Lederer, PA-C   2 Units at 05/29/15 [id number]  . insulin glargine (LANTUS) injection 40 Units  40 Units Subcutaneous QHS Danella Penton, MD      . ipratropium-albuterol (DUONEB) 0.5-2.5 (3) MG/3ML nebulizer solution 3 mL  3 mL Nebulization Q6H PRN Renford Dills, MD      . magic mouthwash  5 mL Oral QID Danella Penton, MD   5 mL at 05/29/15 2225  . magnesium oxide (MAG-OX) tablet 400 mg  400 mg Oral Daily Danella Penton, MD   400 mg at 05/29/15 1758  . morphine 4 MG/ML injection 4 mg  4 mg Intravenous Q3H PRN Renford Dills, MD   4 mg at 05/28/15 1348  . nitroGLYCERIN (NITRODUR - Dosed in mg/24 hr) patch 0.4 mg  0.4 mg Transdermal Daily PRN Kimberly A  Stegmayer, PA-C      . ondansetron (ZOFRAN) tablet 4 mg  4 mg Oral Q6H PRN Kimberly A Stegmayer, PA-C       Or  . ondansetron (ZOFRAN)  injection 4 mg  4 mg Intravenous Q6H PRN Ranae Plumber Stegmayer, PA-C   4 mg at 05/27/15 1235  . oxyCODONE-acetaminophen (PERCOCET/ROXICET) 5-325 MG per tablet 1 tablet  1 tablet Oral Q4H PRN Tonette Lederer, PA-C   1 tablet at 05/27/15 1326  . oxyCODONE-acetaminophen (PERCOCET/ROXICET) 5-325 MG per tablet 2 tablet  2 tablet Oral Q4H PRN Tonette Lederer, PA-C   2 tablet at 05/29/15 2228  . pantoprazole (PROTONIX) EC tablet 40 mg  40 mg Oral BID Danella Penton, MD   40 mg at 05/29/15 2225      Allergies: Allergies  Allergen Reactions  . Dilaudid [Hydromorphone Hcl] Nausea And Vomiting    Pt experienced severe NV and constant dry heaves with no relief from the pain.   Marland Kitchen Morphine And Related Hives and Other (See Comments)    lightheaded      Past Medical History: Past Medical History  Diagnosis Date  . Cancer   . Blood transfusion without reported diagnosis   . CHF (congestive heart failure)   . Coronary artery disease   . Hypertension   . Shortness of breath dyspnea   . Diabetes mellitus without complication   . Peripheral vascular disease      Past Surgical History: Past Surgical History  Procedure Laterality Date  . Back surgery    . Coronary artery bypass graft    . Vascular surgery    . Peripheral vascular catheterization Right 05/27/2015    Procedure: Lower Extremity Angiography;  Surgeon: Renford Dills, MD;  Location: [facility] INVASIVE CV LAB;  Service: Cardiovascular;  Laterality: Right;  . Peripheral vascular catheterization  05/27/2015    Procedure: Lower Extremity Intervention;  Surgeon: Renford Dills, MD;  Location: [facility] INVASIVE CV LAB;  Service: Cardiovascular;;     Family History: Family History  Problem Relation Age of Onset  . Diabetes Mother   . Diabetes Father      Social History: Social History    Social History  . Marital Status: Widowed    Spouse Name: N/A  . Number of Children: N/A  . Years of Education: N/A   Occupational History  . Not on file.   Social History Main Topics  . Smoking status: Never Smoker   . Smokeless tobacco: Not on file  . Alcohol Use: No  . Drug Use: No  . Sexual Activity: Not on file   Other Topics Concern  . Not on file   Social History Narrative     Review of Systems: Is limited by patient's understanding of her illnesses Gen: No weight changes, no fevers or chills HEENT: Denies any vision or hearing problems CV: CABG 20 years ago. No recent issues Resp: Reports some shortness of breath GI: Denies any trouble with nausea and vomiting or diarrhea GU : Denies any problems with voiding MS: Reports pain in the right arm from fracture Derm:  Right leg cellulitis Psych: Appears to have some baseline dementia Heme: No acute issues, reports easy bruising Neuro: No complaints Endocrine. Patient is diabetic. She reports poor control.  Vital Signs: Blood pressure 110/54, pulse 81, temperature 97.8 F (36.6 C), temperature source Oral, resp. rate 18, height 4\' 10"  (1.473 m), weight 96.435 kg (212 lb 9.6 oz), SpO2 100 %.   Intake/Output Summary (Last 24 hours) at 05/30/15 0959 Last data filed at 05/30/15 0454  Gross per 24 hour  Intake   1297 ml  Output    200 ml  Net   1097  ml    Weight trends: Filed Weights   05/27/15 0902 05/28/15 0500 05/30/15 0455  Weight: 92.987 kg (205 lb) 93.123 kg (205 lb 4.8 oz) 96.435 kg (212 lb 9.6 oz)    Physical Exam: General:  obese, elderly, laying in the bed   HEENT  moist mucous membranes, anicteric   Neck:  supple, no masses   Lungs:  difficult exam but mild basilar crackles bilaterally   Heart::  regular, no rub   Abdomen:  soft, obese, nondistended, bowel sounds present   Extremities:  right leg cellulitis, trace to 1+ pitting edema , ischemic rt toes    Neurologic:  alert, able to answer  questions   Skin:  right leg cellulitis   Access:   Foley:        Lab results: Basic Metabolic Panel:  Recent Labs Lab 05/26/15 1242 05/27/15 0514 05/28/15 0513 05/29/15 0659 05/30/15 0627  NA 138 139 140 139 136  K 3.4* 3.6 3.9 4.1 4.3  CL 92* 98* 98* 98* 97*  CO2 34* 32 31 32 29  GLUCOSE 142* 124* 272* 91 62*  BUN 45* 45* 42* 52* 51*  CREATININE 1.40* 1.40* 1.71* 3.20* 3.07*  CALCIUM 8.4* 7.9* 8.0* 7.9* 7.6*  MG 2.5* 2.3  --   --   --   PHOS 3.5  --   --   --   --     Liver Function Tests:  Recent Labs Lab 05/29/15 0659  AST 285*  ALT 236*  ALKPHOS 59  BILITOT 1.0  PROT 5.6*  ALBUMIN 2.4*   No results for input(s): LIPASE, AMYLASE in the last 168 hours. No results for input(s): AMMONIA in the last 168 hours.  CBC:  Recent Labs Lab 05/29/15 0659 05/30/15 0627  WBC 11.7* 11.4*  NEUTROABS 9.0* 9.2*  HGB 7.9* 7.3*  HCT 24.3* 22.5*  MCV 90.7 91.0  PLT 263 243    Cardiac Enzymes: No results for input(s): CKTOTAL, TROPONINI in the last 168 hours.  BNP: Invalid input(s): POCBNP  CBG:  Recent Labs Lab 05/29/15 2357 05/30/15 0431 05/30/15 0801 05/30/15 0829 05/30/15 0950  GLUCAP 116* 86 48* 60* 83    Microbiology: No results found for this or any previous visit (from the past 720 hour(s)).   Coagulation Studies: No results for input(s): LABPROT, INR in the last 72 hours.  Urinalysis: No results for input(s): COLORURINE, LABSPEC, PHURINE, GLUCOSEU, HGBUR, BILIRUBINUR, KETONESUR, PROTEINUR, UROBILINOGEN, NITRITE, LEUKOCYTESUR in the last 72 hours.  Invalid input(s): APPERANCEUR    Imaging: Dg Chest Port 1 View  05/28/2015   CLINICAL DATA:  Status post PICC line placement.  EXAM: PORTABLE CHEST - 1 VIEW  COMPARISON:  May 18, 2015  FINDINGS: The heart size and mediastinal contours are stable. The heart size is enlarged. Patient is status post prior CABG and median sternotomy. A left subclavian central venous line is identified with  distal tip in superior vena cava. There is no pneumothorax. There is mild central pulmonary vascular congestion. The visualized skeletal structures are stable.  IMPRESSION: Left central venous line distal tip in the superior vena cava. There is no pneumothorax.  Mild central pulmonary vascular congestion.   Electronically Signed   By: Sherian Rein M.D.   On: 05/28/2015 17:35      Assessment & Plan: Pt is a 81 y.o. yo female with diabetes, coronary disease, CABG 20 years ago, congestive heart failure, diabetes, peripheral vascular disease, right humeral fracture, was admitted on 05/26/2015 with ischemic right  leg.   1. Acute renal failure on baseline chronic kidney disease st 3. Baseline creatinine 1.40/GFR 32 Chronic kidney disease is likely secondary to diabetic kidney disease and atherosclerosis Acute renal failure is likely secondary to IV contrast exposure and possibly embolic disease Patient appears to be starting to get volume overload therefore I have discontinued the IV fluid supplementation. However, her blood sugars were dropping therefore D10 will be used on a when necessary basis. It can be stopped once her sugars are stabilized Electrolytes and volume status are acceptable. There is no acute indication for dialysis. There is slight improvement in serum creatinine today. Hopefully, it will continue to improve. We will start her on Mucomyst.  2. Diabetes type 2 with Chronic kidney disease  Patient reports poor control We'll obtain hemoglobin A1c  3. Ischemic right leg status post abdominal aortogram, atherectomy of right superficial femoral and popliteal arteries. Part of the procedure was unsuccessful, above the knee amputation was recommended. 95 cc of IV contrast was used.   4. right leg cellulitis. - Currently being treated with IV Rocephin

## 2015-05-31 LAB — CBC WITH DIFFERENTIAL/PLATELET
BASOS PCT: 1 %
Basophils Absolute: 0.1 10*3/uL (ref 0–0.1)
EOS ABS: 0.1 10*3/uL (ref 0–0.7)
Eosinophils Relative: 1 %
HEMATOCRIT: 22.8 % — AB (ref 35.0–47.0)
HEMOGLOBIN: 7.6 g/dL — AB (ref 12.0–16.0)
LYMPHS ABS: 1.1 10*3/uL (ref 1.0–3.6)
Lymphocytes Relative: 9 %
MCH: 29.9 pg (ref 26.0–34.0)
MCHC: 33.1 g/dL (ref 32.0–36.0)
MCV: 90.5 fL (ref 80.0–100.0)
MONO ABS: 1.3 10*3/uL — AB (ref 0.2–0.9)
MONOS PCT: 10 %
NEUTROS ABS: 10 10*3/uL — AB (ref 1.4–6.5)
NEUTROS PCT: 79 %
Platelets: 270 10*3/uL (ref 150–440)
RBC: 2.53 MIL/uL — ABNORMAL LOW (ref 3.80–5.20)
RDW: 16.1 % — AB (ref 11.5–14.5)
WBC: 12.4 10*3/uL — ABNORMAL HIGH (ref 3.6–11.0)

## 2015-05-31 LAB — APTT
APTT: 76 s — AB (ref 24–36)
APTT: 76 s — AB (ref 24–36)

## 2015-05-31 LAB — COMPREHENSIVE METABOLIC PANEL
ALBUMIN: 2.2 g/dL — AB (ref 3.5–5.0)
ALK PHOS: 61 U/L (ref 38–126)
ALT: 121 U/L — ABNORMAL HIGH (ref 14–54)
ANION GAP: 8 (ref 5–15)
AST: 105 U/L — ABNORMAL HIGH (ref 15–41)
BILIRUBIN TOTAL: 0.6 mg/dL (ref 0.3–1.2)
BUN: 48 mg/dL — ABNORMAL HIGH (ref 6–20)
CALCIUM: 7.7 mg/dL — AB (ref 8.9–10.3)
CO2: 30 mmol/L (ref 22–32)
Chloride: 96 mmol/L — ABNORMAL LOW (ref 101–111)
Creatinine, Ser: 2.44 mg/dL — ABNORMAL HIGH (ref 0.44–1.00)
GFR calc non Af Amer: 17 mL/min — ABNORMAL LOW (ref >60)
GFR, EST AFRICAN AMERICAN: 19 mL/min — AB (ref >60)
Glucose, Bld: 116 mg/dL — ABNORMAL HIGH (ref 65–99)
POTASSIUM: 4.9 mmol/L (ref 3.5–5.1)
Sodium: 134 mmol/L — ABNORMAL LOW (ref 135–145)
TOTAL PROTEIN: 5.7 g/dL — AB (ref 6.5–8.1)

## 2015-05-31 LAB — GLUCOSE, CAPILLARY
GLUCOSE-CAPILLARY: 91 mg/dL (ref 65–99)
GLUCOSE-CAPILLARY: 129 mg/dL — AB (ref 65–99)
Glucose-Capillary: 152 mg/dL — ABNORMAL HIGH (ref 65–99)

## 2015-05-31 LAB — HEMOGLOBIN A1C: Hgb A1c MFr Bld: 8 % — ABNORMAL HIGH (ref 4.0–6.0)

## 2015-05-31 MED ORDER — CEPASTAT 14.5 MG MT LOZG
1.0000 | LOZENGE | OROMUCOSAL | Status: DC | PRN
  Filled 2015-05-31: qty 9

## 2015-05-31 MED ORDER — INSULIN ASPART 100 UNIT/ML ~~LOC~~ SOLN: 0.0000 [IU] | SUBCUTANEOUS | Status: DC

## 2015-05-31 MED ORDER — INSULIN GLARGINE 100 UNIT/ML ~~LOC~~ SOLN: 60.0000 [IU] | Freq: Every evening | SUBCUTANEOUS | Status: DC

## 2015-05-31 MED ORDER — OXYCODONE-ACETAMINOPHEN 5-325 MG PO TABS: ORAL_TABLET | ORAL | Status: DC

## 2015-05-31 MED ORDER — NITROGLYCERIN 0.4 MG/HR TD PT24: 0.4000 mg | MEDICATED_PATCH | Freq: Two times a day (BID) | TRANSDERMAL | Status: DC | PRN

## 2015-05-31 MED ORDER — ALPRAZOLAM 0.25 MG PO TABS: 0.2500 mg | ORAL_TABLET | Freq: Two times a day (BID) | ORAL | Status: DC

## 2015-05-31 MED ORDER — ESCITALOPRAM OXALATE 5 MG PO TABS: 5.0000 mg | ORAL_TABLET | Freq: Every day | ORAL | Status: DC

## 2015-05-31 MED ORDER — ACETAMINOPHEN 650 MG RE SUPP: 650.0000 mg | Freq: Four times a day (QID) | RECTAL | Status: DC | PRN

## 2015-05-31 MED ORDER — LORATADINE 10 MG PO TABS
10.0000 mg | ORAL_TABLET | Freq: Every day | ORAL | Status: DC
  Administered 2015-05-31: 10 mg via ORAL
  Filled 2015-05-31: qty 1

## 2015-05-31 MED ORDER — MORPHINE SULFATE (PF) 4 MG/ML IV SOLN: 4.0000 mg | INTRAVENOUS | Status: DC | PRN

## 2015-05-31 MED ORDER — CLOPIDOGREL BISULFATE 75 MG PO TABS
75.0000 mg | ORAL_TABLET | Freq: Every day | ORAL | Status: DC
  Administered 2015-05-31: 75 mg via ORAL
  Filled 2015-05-31: qty 1

## 2015-05-31 MED ORDER — OXYCODONE-ACETAMINOPHEN 5-325 MG PO TABS: 2.0000 | ORAL_TABLET | ORAL | Status: DC | PRN

## 2015-05-31 MED ORDER — ACETAMINOPHEN 325 MG PO TABS: 650.0000 mg | ORAL_TABLET | Freq: Four times a day (QID) | ORAL | Status: AC | PRN

## 2015-05-31 MED ORDER — ALPRAZOLAM 0.25 MG PO TABS: ORAL_TABLET | ORAL | Status: DC

## 2015-05-31 MED ORDER — OXYCODONE-ACETAMINOPHEN 5-325 MG PO TABS: 1.0000 | ORAL_TABLET | ORAL | Status: DC | PRN

## 2015-05-31 MED ORDER — ASPIRIN EC 81 MG PO TBEC
81.0000 mg | DELAYED_RELEASE_TABLET | Freq: Every day | ORAL | Status: DC
  Administered 2015-05-31: 81 mg via ORAL
  Filled 2015-05-31 (×2): qty 1

## 2015-05-31 MED ORDER — FAMOTIDINE 20 MG PO TABS
20.0000 mg | ORAL_TABLET | Freq: Two times a day (BID) | ORAL | Status: DC
Start: 2015-05-31 — End: 2015-06-11

## 2015-05-31 MED ORDER — DOCUSATE SODIUM 100 MG PO CAPS: 100.0000 mg | ORAL_CAPSULE | Freq: Two times a day (BID) | ORAL | Status: AC

## 2015-05-31 NOTE — Progress Notes (Signed)
Pt. Had issues with low BP in the 90's/40's, MD notified and Normal saline bolus ordered. Pt. Assisted to straighten up in bed, BP in the low 100's /60's. Bolus held.

## 2015-05-31 NOTE — Progress Notes (Signed)
Subjective:   Patient reports that she is having hard time eating breakfast because of pain in the right arm. According to nursing staff, blood sugar has not been low Patient reports mild shortness of breath She also reports coughing  Objective:  Vital signs in last 24 hours:  Temp:  [98 F (36.7 C)-98.6 F (37 C)] 98.3 F (36.8 C) (09/17 0508) Pulse Rate:  [64-77] 66 (09/17 0508) Resp:  [16-20] 17 (09/17 0508) BP: (64-119)/(27-72) 108/68 mmHg (09/17 0519) SpO2:  [98 %-100 %] 100 % (09/17 0508)  Weight change:  Filed Weights   05/27/15 0902 05/28/15 0500 05/30/15 0455  Weight: 92.987 kg (205 lb) 93.123 kg (205 lb 4.8 oz) 96.435 kg (212 lb 9.6 oz)    Intake/Output:    Intake/Output Summary (Last 24 hours) at 05/31/15 1028 Last data filed at 05/31/15 0802  Gross per 24 hour  Intake 1228.91 ml  Output    700 ml  Net 528.91 ml     Physical Exam: General:  obese, elderly lady, laying in the bed   HEENT  moist mucous membranes, anicteric   Neck  supple, no masses   Pulm/lungs  mild basilar crackles bilaterally, normal effort, oxygen nasal cannula   CVS/Heart  regular, no rub   Abdomen:   soft, distended, nontender   Extremities:  Trace to 1+ pitting edema, cyanotic right toes   Neurologic:  alert, able to answer questions   Skin:  right shin cellulitis   Access:        Basic Metabolic Panel:   Recent Labs Lab 05/26/15 1242 05/27/15 0514 05/28/15 0513 05/29/15 0659 05/30/15 0627 05/31/15 0602  NA 138 139 140 139 136 134*  K 3.4* 3.6 3.9 4.1 4.3 4.9  CL 92* 98* 98* 98* 97* 96*  CO2 34* 32 31 32 29 30  GLUCOSE 142* 124* 272* 91 62* 116*  BUN 45* 45* 42* 52* 51* 48*  CREATININE 1.40* 1.40* 1.71* 3.20* 3.07* 2.44*  CALCIUM 8.4* 7.9* 8.0* 7.9* 7.6* 7.7*  MG 2.5* 2.3  --   --   --   --   PHOS 3.5  --   --   --   --   --      CBC:  Recent Labs Lab 05/27/15 0514 05/28/15 0513 05/29/15 0659 05/30/15 0627 05/31/15 0602  WBC 10.2 11.3* 11.7* 11.4*  12.4*  NEUTROABS  --  8.6* 9.0* 9.2* 10.0*  HGB 9.4* 7.9* 7.9* 7.3* 7.6*  HCT 28.1* 24.4* 24.3* 22.5* 22.8*  MCV 89.9 91.1 90.7 91.0 90.5  PLT 231 220 263 243 270      Microbiology:  No results found for this or any previous visit (from the past 720 hour(s)).  Coagulation Studies: No results for input(s): LABPROT, INR in the last 72 hours.  Urinalysis: No results for input(s): COLORURINE, LABSPEC, PHURINE, GLUCOSEU, HGBUR, BILIRUBINUR, KETONESUR, PROTEINUR, UROBILINOGEN, NITRITE, LEUKOCYTESUR in the last 72 hours.  Invalid input(s): APPERANCEUR    Imaging: No results found.   Medications:   . heparin 1,150 Units/hr (05/31/15 0802)   . acetylcysteine  600 mg Oral BID  . ALPRAZolam  0.25 mg Oral BID  . bisoprolol  5 mg Oral Daily  . cefTRIAXone (ROCEPHIN)  IV  1 g Intravenous Q24H  . docusate sodium  100 mg Oral BID  . escitalopram  5 mg Oral Daily  . famotidine  20 mg Oral BID  . insulin aspart  0-24 Units Subcutaneous 6 times per day  . insulin glargine  20 Units Subcutaneous QHS  . magic mouthwash  5 mL Oral QID  . magnesium oxide  400 mg Oral Daily  . pantoprazole  40 mg Oral BID   acetaminophen **OR** acetaminophen, diphenhydrAMINE, ipratropium-albuterol, morphine injection, nitroGLYCERIN, ondansetron **OR** ondansetron (ZOFRAN) IV, oxyCODONE-acetaminophen, oxyCODONE-acetaminophen  Assessment/ Plan:  81 y.o. female with diabetes, coronary disease, CABG 20 years ago, congestive heart failure, diabetes, peripheral vascular disease, right humeral fracture, was admitted on 05/26/2015 with ischemic right leg.   1. Acute renal failure on baseline chronic kidney disease st 3. Baseline creatinine 1.40/GFR 32 Chronic kidney disease is likely secondary to diabetic kidney disease and atherosclerosis Acute renal failure is likely secondary to IV contrast exposure and possibly embolic disease Electrolytes and volume status are acceptable. There is no acute indication for  dialysis. There is slight improvement in serum creatinine today. Hopefully, it will continue to improve. Continue Mucomyst.  2. Diabetes type 2 with Chronic kidney disease  Patient reports poor control We'll obtain hemoglobin A1c It was 8.1% in May 2016  3. Ischemic right leg status post abdominal aortogram, atherectomy of right superficial femoral and popliteal arteries. Part of the procedure was unsuccessful, above the knee amputation was recommended. 95 cc of IV contrast was used.   4. right leg cellulitis. - Currently being treated with IV Rocephin    LOS: 5 Miia Blanks 9/17/201610:28 AM

## 2015-05-31 NOTE — Progress Notes (Signed)
Report called to [facility]. EMS transport called. Pt stable, waiting on ride.

## 2015-05-31 NOTE — Progress Notes (Addendum)
ANTICOAGULATION CONSULT NOTE - Initial Consult  Pharmacy Consult for heparin monitoring Indication: ischemic foot  Allergies  Allergen Reactions  . Dilaudid [Hydromorphone Hcl] Nausea And Vomiting    Pt experienced severe NV and constant dry heaves with no relief from the pain.   Marland Kitchen Morphine And Related Hives and Other (See Comments)    lightheaded    Patient Measurements: Height:  (147.3 cm) Weight: 212 lb 9.6 oz (96.435 kg) IBW/kg (Calculated) : 40.9  Vital Signs: Temp: 98.3 F (36.8 C) (09/17 0508) Temp Source: Oral (09/17 0508) BP: 108/68 mmHg (09/17 0519) Pulse Rate: 66 (09/17 0508)  Labs:  Recent Labs  05/29/15 0659  05/30/15 0627 05/30/15 2245 05/31/15 0602  HGB 7.9*  --  7.3*  --  7.6*  HCT 24.3*  --  22.5*  --  22.8*  PLT 263  --  243  --  270  APTT 138*  < > 87* 36 76*  CREATININE 3.20*  --  3.07*  --   --   < > = values in this interval not displayed.  Estimated Creatinine Clearance: 12.6 mL/min (by C-G formula based on Cr of 3.07).  Assessment: Patient admitted with ischemic foot, started on heparin 15units/kg (1400 units/hr) yesterday at ~13:30. Discussed with PA this morning, pharmacy will follow heparin drip targeting aPTT of 60-90 seconds.  Goal of Therapy:  APTT 60-90 sec   Plan: Current orders for heparin 1350 units/hr, aPTT supratheraputic. Per heparin nomogram, will stop heparin infusion for 30 minutes and resume at decreased rate of 1250 units/hr and recheck aPTT in 6 hours.  9/15:  APTT @ 15:00 = 111 Will decrease gtt rate to 1150 units/hr and recheck aPTT 6 hrs after rate change on 9/16 @ 1:00.    0916 0055 aPTT therapeutic. Will confirm level in 6 hours.   0916 0700 aPTT therapeutic, will continue current rate at 1150 units/hr.  Recheck aPTT with AM labs.  6045 2245 aPTT subtherapeutic, talked to [employer] who said drip was not running when she came in. Ordered bolus 1000 units x 1 and restart drip at last rate 1150 units/hr. Will  follow up in 6 hours.   4098 0602 aPTT therapeutic. Continue current rate, will confirm with follow up in 6 hours.   Pharmacy to follow per consult  Harrold Donath A. Dahlia Bailiff, PharmD Clinical Pharmacist  05/31/2015 6:45 AM

## 2015-05-31 NOTE — Progress Notes (Signed)
Removed PICC line. Manual pressure applied to site for 15 mins. Pt laying flat for 30 mins. No signs of active bleeding around site or on drsg.

## 2015-05-31 NOTE — Progress Notes (Signed)
Patient ID: [name], female   DOB: 1924-12-21, 81 y.o.   MRN: [id number] Patient ID: [name], female   DOB: 23-Aug-1925, 81 y.o.   MRN: [id number] Patient ID: [name], female   DOB: [birth year], 81 y.o.   MRN: [id number] Patient ID: [name], female   DOB: Feb 13, 1925, 81 y.o.   MRN: [id number] [name] is a 81 y.o. female   SUBJECTIVE:  Patient admitted with ischemic right leg. Moderate pain in shoulder, moderate right leg pain. Sleeping this morning. Vascular has decided against AKA. ______________________________________________________________________  ROS: Review of systems is unremarkable for any active cardiac,respiratory, GI, GU, hematologic, neurologic or psychiatric systems, 10 systems reviewed.  Marland Kitchen acetylcysteine  600 mg Oral BID  . ALPRAZolam  0.25 mg Oral BID  . bisoprolol  5 mg Oral Daily  . cefTRIAXone (ROCEPHIN)  IV  1 g Intravenous Q24H  . docusate sodium  100 mg Oral BID  . escitalopram  5 mg Oral Daily  . famotidine  20 mg Oral BID  . insulin aspart  0-24 Units Subcutaneous 6 times per day  . insulin glargine  20 Units Subcutaneous QHS  . magic mouthwash  5 mL Oral QID  . magnesium oxide  400 mg Oral Daily  . pantoprazole  40 mg Oral BID   acetaminophen **OR** acetaminophen, diphenhydrAMINE, ipratropium-albuterol, morphine injection, nitroGLYCERIN, ondansetron **OR** ondansetron (ZOFRAN) IV, oxyCODONE-acetaminophen, oxyCODONE-acetaminophen   Past Medical History  Diagnosis Date  . Cancer   . Blood transfusion without reported diagnosis   . CHF (congestive heart failure)   . Coronary artery disease   . Hypertension   . Shortness of breath dyspnea   . Diabetes mellitus without complication   . Peripheral vascular disease     Past Surgical History  Procedure Laterality Date  . Back surgery    . Coronary artery bypass graft    . Vascular surgery    . Peripheral vascular catheterization Right 05/27/2015    Procedure: Lower Extremity  Angiography;  Surgeon: Renford Dills, MD;  Location: [facility] INVASIVE CV LAB;  Service: Cardiovascular;  Laterality: Right;  . Peripheral vascular catheterization  05/27/2015    Procedure: Lower Extremity Intervention;  Surgeon: Renford Dills, MD;  Location: [facility] INVAS[facility];  Service: Cardiovascular;;    PHYSICAL EXAM:  BP 108/68 mmHg  Pulse 66  Temp(Src) 98.3 F (36.8 C) (Oral)  Resp 17  Ht 4\' 10"  (1.473 m)  Wt 96.435 kg (212 lb 9.6 oz)  BMI 44.45 kg/m2  SpO2 100%  Wt Readings from Last 3 Encounters:  05/30/15 96.435 kg (212 lb 9.6 oz)  05/18/15 92.534 kg (204 lb)           BP Readings from Last 3 Encounters:  05/31/15 108/68  05/18/15 156/56    Constitutional: NAD Neck: supple, no thyromegaly Respiratory: CTA, no rales or wheezes Cardiovascular: RRR, 2/6 murmur, no gallop Abdomen: soft, good BS, nontender Extremities: no edema, right shoulder in sling Neuro:Somnolent, no focal motor or sensory deficits  ASSESSMENT/PLAN:  Labs and imaging studies were reviewed  Acute on chronic renal failure-creatinine down to 2.44. Nephrology note reviewed.  Sore throat-magic mouthwash  Diabetes mellitus- with progressive renal dysfunction, add D5 to avoid hypoglycemia, lower insulin dose  Ischemic leg-IV heparin, not amenable to angioplasty, disposition pending  Right leg cellulitis-IV Rocephin, better  Right humeral fracture-Percocet when necessary  Chronic systolic congestive heart failure -follow very closely off diuretics anemia-follow closely, hemoglobin 7.6    disposition -  awaiting placement

## 2015-05-31 NOTE — Progress Notes (Signed)
ANTICOAGULATION CONSULT NOTE -follow up  Pharmacy Consult for heparin monitoring Indication: ischemic foot  Allergies  Allergen Reactions  . Dilaudid [Hydromorphone Hcl] Nausea And Vomiting    Pt experienced severe NV and constant dry heaves with no relief from the pain.   Marland Kitchen Morphine And Related Hives and Other (See Comments)    lightheaded    Patient Measurements: Height:  (147.3 cm) Weight: 212 lb 9.6 oz (96.435 kg) IBW/kg (Calculated) : 40.9  Vital Signs: Temp: 98.9 F (37.2 C) (09/17 1150) Temp Source: Oral (09/17 1150) BP: 118/53 mmHg (09/17 1150) Pulse Rate: 67 (09/17 1150)  Labs:  Recent Labs  05/29/15 0659  05/30/15 0627 05/30/15 2245 05/31/15 0602 05/31/15 1158  HGB 7.9*  --  7.3*  --  7.6*  --   HCT 24.3*  --  22.5*  --  22.8*  --   PLT 263  --  243  --  270  --   APTT 138*  < > 87* 36 76* 76*  CREATININE 3.20*  --  3.07*  --  2.44*  --   < > = values in this interval not displayed.  Estimated Creatinine Clearance: 15.9 mL/min (by C-G formula based on Cr of 2.44).  Assessment: Patient admitted with ischemic foot, started on heparin 15units/kg (1400 units/hr) yesterday at ~13:30. Discussed with PA this morning, pharmacy will follow heparin drip targeting aPTT of 60-90 seconds.  Goal of Therapy:  APTT 60-90 sec   Plan: Current orders for heparin 1350 units/hr, aPTT supratheraputic. Per heparin nomogram, will stop heparin infusion for 30 minutes and resume at decreased rate of 1250 units/hr and recheck aPTT in 6 hours.  9/15:  APTT @ 15:00 = 111 Will decrease gtt rate to 1150 units/hr and recheck aPTT 6 hrs after rate change on 9/16 @ 1:00.    0916 0055 aPTT therapeutic. Will confirm level in 6 hours.   0916 0700 aPTT therapeutic, will continue current rate at 1150 units/hr.  Recheck aPTT with AM labs.  8119 2245 aPTT subtherapeutic, talked to [employer] who said drip was not running when she came in. Ordered bolus 1000 units x 1 and restart drip  at last rate 1150 units/hr. Will follow up in 6 hours.   1478 0602 aPTT therapeutic. Continue current rate, will confirm with follow up in 6 hours.   2956 1158 aPTT=76 (therapeutic). Continue current rate. Recheck aPTT in 6 hrs.   Pharmacy to follow per consult  Bari Mantis PharmD Clinical Pharmacist 05/31/2015 12:37 PM

## 2015-05-31 NOTE — Progress Notes (Signed)
[facility]  Daily Progress Note   Subjective: 4 Days Post-Op: Abdominal aortogram, Right lower extremity distal runoff third order catheter placement and Crosser atherectomy right superficial femoral and popliteal arteries unsuccessful.  Patient and family chose SNF placement. Patient and family still understand that when the patient is ready for amputation - quality of life or decompensation of leg occurs patient will contact us. Otherwise, as per patient leg discomfort is stable.   Objective: Filed Vitals:   05/30/15 2231 05/31/15 0508 05/31/15 0519 05/31/15 1150  BP: 104/72 119/27 108/68 118/53  Pulse: 64 66  67  Temp:  98.3 F (36.8 C)  98.9 F (37.2 C)  TempSrc:  Oral  Oral  Resp: Height:      Weight:      SpO2: 100% 100%  100%    Intake/Output Summary (Last 24 hours) at 05/31/15 1238 Last data filed at 05/31/15 1211  Gross per 24 hour  Intake 1435.91 ml  Output   1050 ml  Net 385.91 ml    Physical Exam: A&Ox3, NAD CV: RRR Pulmonary: CTA Bilaterally Abdomen: Soft, Nontender, Nondistended Vascular: Right upper extremity: In sling, pulses present Right lower extremity: Thigh soft, calf edematous with some mottling noted, cool to touch, toes cold.  Left Extremity: Thigh soft, calf soft, foot warm.   Laboratory: CBC    Component Value Date/Time   WBC 12.4* 05/31/2015 0602   HGB 7.6* 05/31/2015 0602   HCT 22.8* 05/31/2015 0602   PLT 270 05/31/2015 0602   BMET    Component Value Date/Time   NA 134* 05/31/2015 0602   K 4.9 05/31/2015 0602   CL 96* 05/31/2015 0602   CO2 30 05/31/2015 0602   GLUCOSE 116* 05/31/2015 0602   BUN 48* 05/31/2015 0602   CREATININE 2.44* 05/31/2015 0602   CALCIUM 7.7* 05/31/2015 0602   GFRNONAA 17* 05/31/2015 0602   GFRAA 19* 05/31/2015 0602   Assessment/Planning: 81 year old female s/p right lower extremity intervention for ischemic leg - unable to revascularize. 1) Patient may need AKA - not  during this admission. Patient will decide in future as to when she will want to proceed with amputation based on pain level and quality of life. No indication at this time for amputation during his admission (no infection etc). 2) Pain control 3) Discharge to SNF today  Cleda Daub PA-C 05/31/2015 12:38 PM

## 2015-05-31 NOTE — Discharge Summary (Signed)
[facility]    Discharge Summary    Patient ID:  [name] MRN: [id number] DOB/AGE: [birth year] 81 y.o.  Admit date: 05/26/2015 Discharge date: 05/31/2015 Date of Surgery: 05/26/2015 - 05/27/2015 Surgeon: Moishe Spice): Renford Dills, MD  Admission Diagnosis: Right Ischemic Leg In Pt Rm 209   RT leg angio    Rt ischemic leg  Discharge Diagnoses:  Right Ischemic Leg In Pt Rm 209   RT leg angio    Rt ischemic leg  Secondary Diagnoses: Past Medical History  Diagnosis Date  . Cancer   . Blood transfusion without reported diagnosis   . CHF (congestive heart failure)   . Coronary artery disease   . Hypertension   . Shortness of breath dyspnea   . Diabetes mellitus without complication   . Peripheral vascular disease     Procedure(s): Lower Extremity Angiography Lower Extremity Intervention  Discharged Condition: stable  HPI:  Patient fell about two weeks ago and sustained what sounds like a spiral fracture of her right humerus. Since then, she had been experiencing right lower extremity pain. Described the pain as constant in nature mostly located at the calf radiating distally. She is also experiencing swelling and erythema to the right leg. She states the pain has progressively worsened over the last week prompting her to seek medical attention as an outpatient in our clinic.   During her appointment at our clinic she was found to have a cold right extremity and was admitted to the hospital for further care.  The patient is s/p a right lower extremity crosser atherectomy of the SFA and popliteal artery with angioplasty of the SFA and popliteal artery on 11/12/14.  Hospital Course:  Patient was admitted and started on a heparin drip.   On 05/27/15, the patient underwent an abdominal aortogram, right lower extremity distal runoff third order catheter placement and Crosser atherectomy right superficial femoral and popliteal arteries which was  unsuccessful at revascularizing her extremity.  The rest of the patients hospital stay was unremarkable. She was treated with a heparin drip which was transitioned to ASA and Plavix, received pain control and physical therapy.   The patient and the family understand the patient will need an AKA in the future. There was no indication for amputation at this time during this hospitalization. The patient and the family understand that if her quality of life becomes effected or there is decompensation of the extremity (infection / gangrene) they are to contact our office and we will follow through with the amputation.   Physical exam:  A&Ox3, NAD CV: RRR Pulmonary: CTA Bilaterally Abdomen: Soft, Nontender, Nondistended Vascular: Right upper extremity: In sling, pulses present Right lower extremity: Thigh soft, calf edematous with some mottling noted, cool to touch, toes cold.  Left Extremity: Thigh soft, calf soft, foot warm.  Pt. ambulating with difficulty.  Voiding and taking PO diet without difficulty. Pt pain controlled with PO pain meds.  Complications:none  Consults:  Treatment Team:  Auburn Bilberry, MD Deeann Saint, MD Mosetta Pigeon, MD  Significant Diagnostic Studies: CBC Lab Results  Component Value Date   WBC 12.4* 05/31/2015   HGB 7.6* 05/31/2015   HCT 22.8* 05/31/2015   MCV 90.5 05/31/2015   PLT 270 05/31/2015    BMET    Component Value Date/Time   NA 134* 05/31/2015 0602   K 4.9 05/31/2015 0602   CL 96* 05/31/2015 0602   CO2 30 05/31/2015 0602   GLUCOSE 116* 05/31/2015 0602  BUN 48* 05/31/2015 0602   CREATININE 2.44* 05/31/2015 0602   CALCIUM 7.7* 05/31/2015 0602   GFRNONAA 17* 05/31/2015 0602   GFRAA 19* 05/31/2015 0602   COAG Lab Results  Component Value Date   INR 1.09 05/26/2015     Disposition:  Discharge to :Skilled nursing facility    Medication List    STOP taking these medications        HYDROcodone-acetaminophen 5-325 MG per  tablet  Commonly known as:  NORCO/VICODIN      TAKE these medications        acetaminophen 325 MG tablet  Commonly known as:  TYLENOL  Take 2 tablets (650 mg total) by mouth every 6 (six) hours as needed for mild pain (or Fever >/= 101).     acetaminophen 650 MG suppository  Commonly known as:  TYLENOL  Place 1 suppository (650 mg total) rectally every 6 (six) hours as needed for mild pain (or Fever >/= 101).     allopurinol 100 MG tablet  Commonly known as:  ZYLOPRIM  Take 200 mg by mouth daily.     ALPRAZolam 0.25 MG tablet  Commonly known as:  XANAX  Take 1 tablet (0.25 mg total) by mouth 2 (two) times daily.     aspirin EC 81 MG tablet  Take 81 mg by mouth daily.     atorvastatin 40 MG tablet  Commonly known as:  LIPITOR  Take 20 mg by mouth at bedtime.     azelastine 0.1 % nasal spray  Commonly known as:  ASTELIN  Place 1 spray into both nostrils as needed for rhinitis. Use in each nostril as directed     bisoprolol-hydrochlorothiazide 10-6.25 MG per tablet  Commonly known as:  ZIAC  Take 1 tablet by mouth 2 (two) times daily.     clopidogrel 75 MG tablet  Commonly known as:  PLAVIX  Take 75 mg by mouth daily.     docusate sodium 100 MG capsule  Commonly known as:  COLACE  Take 1 capsule (100 mg total) by mouth 2 (two) times daily.     escitalopram 5 MG tablet  Commonly known as:  LEXAPRO  Take 1 tablet (5 mg total) by mouth daily.     famotidine 20 MG tablet  Commonly known as:  PEPCID  Take 1 tablet (20 mg total) by mouth 2 (two) times daily.     fexofenadine 60 MG tablet  Commonly known as:  ALLEGRA  Take 60 mg by mouth as needed for allergies or rhinitis.     insulin aspart 100 UNIT/ML injection  Commonly known as:  novoLOG  Inject 0-24 Units into the skin every 4 (four) hours.     insulin glargine 100 UNIT/ML injection  Commonly known as:  LANTUS  Inject 60 Units into the skin at bedtime.     insulin glargine 100 UNIT/ML injection  Commonly  known as:  LANTUS  Inject 0.6 mLs (60 Units total) into the skin Nightly.     insulin lispro 100 UNIT/ML injection  Commonly known as:  HUMALOG  Inject 30 Units into the skin 3 (three) times daily before meals.     isosorbide mononitrate 60 MG 24 hr tablet  Commonly known as:  IMDUR  Take 60 mg by mouth daily.     losartan-hydrochlorothiazide 100-25 MG per tablet  Commonly known as:  HYZAAR  Take 1 tablet by mouth daily.     metolazone 2.5 MG tablet  Commonly known as:  ZAROXOLYN  Take 2.5 mg by mouth every other day.     morphine 4 MG/ML injection  Inject 1 mL (4 mg total) into the vein every 3 (three) hours as needed for severe pain.     multivitamin tablet  Take 1 tablet by mouth daily.     nitroGLYCERIN 0.4 mg/hr patch  Commonly known as:  NITRODUR - Dosed in mg/24 hr  Place 0.4 mg onto the skin daily.     nitroGLYCERIN 0.4 mg/hr patch  Commonly known as:  NITRODUR - Dosed in mg/24 hr  Place 1 patch (0.4 mg total) onto the skin every 12 (twelve) hours as needed. Place one patch onto skin as needed. Then remove for 12 hours.     omeprazole 20 MG capsule  Commonly known as:  PRILOSEC  Take 20 mg by mouth daily as needed.     oxyCODONE-acetaminophen 5-325 MG per tablet  Commonly known as:  PERCOCET/ROXICET  Take 1 tablet by mouth every 4 (four) hours as needed for moderate pain.     oxyCODONE-acetaminophen 5-325 MG per tablet  Commonly known as:  PERCOCET/ROXICET  Take 2 tablets by mouth every 4 (four) hours as needed for severe pain.     potassium chloride SA 20 MEQ tablet  Commonly known as:  K-DUR,KLOR-CON  Take 40 mEq by mouth daily.     promethazine 12.5 MG tablet  Commonly known as:  PHENERGAN  Take 1 tablet (12.5 mg total) by mouth every 8 (eight) hours as needed for nausea or vomiting.     torsemide 20 MG tablet  Commonly known as:  DEMADEX  Take 20 mg by mouth daily.       Verbal and written Discharge instructions given to the patient. Wound care  per Discharge AVS Follow-up Information    Follow up with Schnier, Latina Craver, MD In 2 weeks.   Specialties:  Vascular Surgery, Cardiology, Radiology, Vascular Surgery   Contact information:   [address] [place] [address] [phone]       Follow up with Christena Flake, MD.   Specialty:  Surgery   Why:  As scheduled for right humerus fracture   Contact information:   [address] [facility] [address] [place] [address] [phone]       Follow up with Danella Penton., MD In 1 week.   Specialty:  Internal Medicine   Contact information:   [address] [place] [address] [phone]       Signed: Tonette Lederer, PA-C  05/31/2015, 1:14 PM

## 2015-06-05 ENCOUNTER — Emergency Department: Payer: Medicare Other

## 2015-06-05 ENCOUNTER — Encounter: Payer: Self-pay | Admitting: Emergency Medicine

## 2015-06-05 ENCOUNTER — Inpatient Hospital Stay
Admission: EM | Admit: 2015-06-05 | Discharge: 2015-06-11 | DRG: 239 | Disposition: A | Discharge status: Skilled Nursing Facility | Payer: Medicare Other | Attending: Internal Medicine | Admitting: Internal Medicine

## 2015-06-05 DIAGNOSIS — Z951 Presence of aortocoronary bypass graft: Secondary | ICD-10-CM | POA: Diagnosis not present

## 2015-06-05 DIAGNOSIS — L03115 Cellulitis of right lower limb: Secondary | ICD-10-CM

## 2015-06-05 DIAGNOSIS — I129 Hypertensive chronic kidney disease with stage 1 through stage 4 chronic kidney disease, or unspecified chronic kidney disease: Secondary | ICD-10-CM | POA: Diagnosis present

## 2015-06-05 DIAGNOSIS — I2510 Atherosclerotic heart disease of native coronary artery without angina pectoris: Secondary | ICD-10-CM | POA: Diagnosis present

## 2015-06-05 DIAGNOSIS — D62 Acute posthemorrhagic anemia: Secondary | ICD-10-CM | POA: Diagnosis not present

## 2015-06-05 DIAGNOSIS — I350 Nonrheumatic aortic (valve) stenosis: Secondary | ICD-10-CM | POA: Diagnosis not present

## 2015-06-05 DIAGNOSIS — Z833 Family history of diabetes mellitus: Secondary | ICD-10-CM

## 2015-06-05 DIAGNOSIS — I998 Other disorder of circulatory system: Secondary | ICD-10-CM | POA: Diagnosis present

## 2015-06-05 DIAGNOSIS — L97519 Non-pressure chronic ulcer of other part of right foot with unspecified severity: Secondary | ICD-10-CM | POA: Diagnosis not present

## 2015-06-05 DIAGNOSIS — I5023 Acute on chronic systolic (congestive) heart failure: Secondary | ICD-10-CM | POA: Diagnosis not present

## 2015-06-05 DIAGNOSIS — Z7982 Long term (current) use of aspirin: Secondary | ICD-10-CM | POA: Diagnosis not present

## 2015-06-05 DIAGNOSIS — Z794 Long term (current) use of insulin: Secondary | ICD-10-CM

## 2015-06-05 DIAGNOSIS — N183 Chronic kidney disease, stage 3 (moderate): Secondary | ICD-10-CM | POA: Diagnosis present

## 2015-06-05 DIAGNOSIS — E1122 Type 2 diabetes mellitus with diabetic chronic kidney disease: Secondary | ICD-10-CM | POA: Diagnosis not present

## 2015-06-05 DIAGNOSIS — I96 Gangrene, not elsewhere classified: Secondary | ICD-10-CM

## 2015-06-05 DIAGNOSIS — W19XXXD Unspecified fall, subsequent encounter: Secondary | ICD-10-CM | POA: Diagnosis present

## 2015-06-05 DIAGNOSIS — E1152 Type 2 diabetes mellitus with diabetic peripheral angiopathy with gangrene: Principal | ICD-10-CM | POA: Diagnosis present

## 2015-06-05 DIAGNOSIS — S42301D Unspecified fracture of shaft of humerus, right arm, subsequent encounter for fracture with routine healing: Secondary | ICD-10-CM

## 2015-06-05 DIAGNOSIS — Z95828 Presence of other vascular implants and grafts: Secondary | ICD-10-CM

## 2015-06-05 DIAGNOSIS — I739 Peripheral vascular disease, unspecified: Secondary | ICD-10-CM | POA: Diagnosis not present

## 2015-06-05 DIAGNOSIS — I482 Chronic atrial fibrillation: Secondary | ICD-10-CM | POA: Diagnosis present

## 2015-06-05 DIAGNOSIS — Z66 Do not resuscitate: Secondary | ICD-10-CM | POA: Diagnosis present

## 2015-06-05 DIAGNOSIS — S42301S Unspecified fracture of shaft of humerus, right arm, sequela: Secondary | ICD-10-CM

## 2015-06-05 LAB — CBC WITH DIFFERENTIAL/PLATELET
BASOS ABS: 0.1 10*3/uL (ref 0–0.1)
BASOS PCT: 1 %
EOS ABS: 0.1 10*3/uL (ref 0–0.7)
Eosinophils Relative: 1 %
HEMATOCRIT: 23 % — AB (ref 35.0–47.0)
HEMOGLOBIN: 7.3 g/dL — AB (ref 12.0–16.0)
Lymphocytes Relative: 11 %
Lymphs Abs: 1.2 10*3/uL (ref 1.0–3.6)
MCH: 28.7 pg (ref 26.0–34.0)
MCHC: 31.8 g/dL — ABNORMAL LOW (ref 32.0–36.0)
MCV: 90.2 fL (ref 80.0–100.0)
Monocytes Absolute: 1 10*3/uL — ABNORMAL HIGH (ref 0.2–0.9)
Monocytes Relative: 9 %
NEUTROS ABS: 8.9 10*3/uL — AB (ref 1.4–6.5)
NEUTROS PCT: 78 %
Platelets: 399 10*3/uL (ref 150–440)
RBC: 2.55 MIL/uL — AB (ref 3.80–5.20)
RDW: 16.2 % — ABNORMAL HIGH (ref 11.5–14.5)
WBC: 11.2 10*3/uL — AB (ref 3.6–11.0)

## 2015-06-05 LAB — COMPREHENSIVE METABOLIC PANEL
ALBUMIN: 2 g/dL — AB (ref 3.5–5.0)
ALK PHOS: 96 U/L (ref 38–126)
ALT: 42 U/L (ref 14–54)
ANION GAP: 9 (ref 5–15)
AST: 42 U/L — AB (ref 15–41)
BILIRUBIN TOTAL: 0.5 mg/dL (ref 0.3–1.2)
BUN: 47 mg/dL — AB (ref 6–20)
CALCIUM: 8.1 mg/dL — AB (ref 8.9–10.3)
CO2: 33 mmol/L — AB (ref 22–32)
CREATININE: 2.14 mg/dL — AB (ref 0.44–1.00)
Chloride: 96 mmol/L — ABNORMAL LOW (ref 101–111)
GFR calc Af Amer: 23 mL/min — ABNORMAL LOW (ref >60)
GFR calc non Af Amer: 19 mL/min — ABNORMAL LOW (ref >60)
GLUCOSE: 196 mg/dL — AB (ref 65–99)
Potassium: 4.5 mmol/L (ref 3.5–5.1)
SODIUM: 138 mmol/L (ref 135–145)
Total Protein: 6.1 g/dL — ABNORMAL LOW (ref 6.5–8.1)

## 2015-06-05 LAB — TROPONIN I
Troponin I: 0.06 ng/mL — ABNORMAL HIGH (ref <0.031)
Troponin I: 0.07 ng/mL — ABNORMAL HIGH (ref <0.031)

## 2015-06-05 LAB — APTT: APTT: 36 s (ref 24–36)

## 2015-06-05 LAB — PROTIME-INR
INR: 1.24
Prothrombin Time: 15.8 seconds — ABNORMAL HIGH (ref 11.4–15.0)

## 2015-06-05 LAB — GLUCOSE, CAPILLARY: Glucose-Capillary: 158 mg/dL — ABNORMAL HIGH (ref 65–99)

## 2015-06-05 MED ORDER — ACETAMINOPHEN 650 MG RE SUPP: 650.0000 mg | Freq: Four times a day (QID) | RECTAL | Status: DC | PRN

## 2015-06-05 MED ORDER — INSULIN ASPART 100 UNIT/ML ~~LOC~~ SOLN
0.0000 [IU] | Freq: Every day | SUBCUTANEOUS | Status: DC
  Administered 2015-06-07: 5 [IU] via SUBCUTANEOUS
  Administered 2015-06-10: 4 [IU] via SUBCUTANEOUS
  Filled 2015-06-05: qty 5
  Filled 2015-06-05: qty 4

## 2015-06-05 MED ORDER — PANTOPRAZOLE SODIUM 40 MG PO TBEC
40.0000 mg | DELAYED_RELEASE_TABLET | Freq: Every day | ORAL | Status: DC
  Administered 2015-06-05: 40 mg via ORAL
  Filled 2015-06-05: qty 1

## 2015-06-05 MED ORDER — FAMOTIDINE 20 MG PO TABS
20.0000 mg | ORAL_TABLET | Freq: Two times a day (BID) | ORAL | Status: DC
  Administered 2015-06-05: 20 mg via ORAL
  Filled 2015-06-05: qty 1

## 2015-06-05 MED ORDER — ESCITALOPRAM OXALATE 10 MG PO TABS
5.0000 mg | ORAL_TABLET | Freq: Every day | ORAL | Status: DC
  Administered 2015-06-06: 5 mg via ORAL
  Administered 2015-06-07: 08:00:00 via ORAL
  Administered 2015-06-08 – 2015-06-11 (×4): 5 mg via ORAL
  Filled 2015-06-05 (×6): qty 1

## 2015-06-05 MED ORDER — ATORVASTATIN CALCIUM 20 MG PO TABS
20.0000 mg | ORAL_TABLET | Freq: Every day | ORAL | Status: DC
  Administered 2015-06-05 – 2015-06-10 (×6): 20 mg via ORAL
  Filled 2015-06-05 (×6): qty 1

## 2015-06-05 MED ORDER — ISOSORBIDE MONONITRATE ER 30 MG PO TB24
60.0000 mg | ORAL_TABLET | Freq: Every day | ORAL | Status: DC
  Administered 2015-06-06 – 2015-06-11 (×6): 60 mg via ORAL
  Filled 2015-06-05 (×6): qty 2

## 2015-06-05 MED ORDER — LORATADINE 10 MG PO TABS
10.0000 mg | ORAL_TABLET | Freq: Every day | ORAL | Status: DC
  Administered 2015-06-05 – 2015-06-11 (×7): 10 mg via ORAL
  Filled 2015-06-05 (×7): qty 1

## 2015-06-05 MED ORDER — ADULT MULTIVITAMIN W/MINERALS CH
1.0000 | ORAL_TABLET | Freq: Every day | ORAL | Status: DC
  Administered 2015-06-06 – 2015-06-11 (×6): 1 via ORAL
  Filled 2015-06-05 (×6): qty 1

## 2015-06-05 MED ORDER — HYDROMORPHONE HCL 1 MG/ML IJ SOLN
0.5000 mg | INTRAMUSCULAR | Status: DC | PRN
  Administered 2015-06-05 – 2015-06-06 (×3): 0.5 mg via INTRAVENOUS
  Filled 2015-06-05 (×3): qty 1

## 2015-06-05 MED ORDER — ALPRAZOLAM 0.25 MG PO TABS
0.2500 mg | ORAL_TABLET | Freq: Three times a day (TID) | ORAL | Status: DC | PRN
  Administered 2015-06-06 – 2015-06-09 (×4): 0.25 mg via ORAL
  Filled 2015-06-05 (×5): qty 1

## 2015-06-05 MED ORDER — VANCOMYCIN HCL IN DEXTROSE 1-5 GM/200ML-% IV SOLN
1000.0000 mg | Freq: Once | INTRAVENOUS | Status: AC
  Administered 2015-06-05: 1000 mg via INTRAVENOUS
  Filled 2015-06-05: qty 200

## 2015-06-05 MED ORDER — INSULIN ASPART 100 UNIT/ML ~~LOC~~ SOLN
0.0000 [IU] | Freq: Three times a day (TID) | SUBCUTANEOUS | Status: DC
  Administered 2015-06-06 (×2): 5 [IU] via SUBCUTANEOUS
  Administered 2015-06-07: 8 [IU] via SUBCUTANEOUS
  Administered 2015-06-07: 11 [IU] via SUBCUTANEOUS
  Administered 2015-06-08: 3 [IU] via SUBCUTANEOUS
  Administered 2015-06-08: 11 [IU] via SUBCUTANEOUS
  Administered 2015-06-08: 5 [IU] via SUBCUTANEOUS
  Administered 2015-06-09: 2 [IU] via SUBCUTANEOUS
  Administered 2015-06-09 (×2): 5 [IU] via SUBCUTANEOUS
  Administered 2015-06-10 (×2): 2 [IU] via SUBCUTANEOUS
  Administered 2015-06-10: 3 [IU] via SUBCUTANEOUS
  Filled 2015-06-05: qty 8
  Filled 2015-06-05: qty 5
  Filled 2015-06-05 (×2): qty 11
  Filled 2015-06-05 (×2): qty 2
  Filled 2015-06-05: qty 5
  Filled 2015-06-05: qty 3
  Filled 2015-06-05: qty 2
  Filled 2015-06-05: qty 5
  Filled 2015-06-05: qty 3
  Filled 2015-06-05 (×2): qty 5

## 2015-06-05 MED ORDER — DOCUSATE SODIUM 100 MG PO CAPS
100.0000 mg | ORAL_CAPSULE | Freq: Two times a day (BID) | ORAL | Status: DC
  Administered 2015-06-06 – 2015-06-11 (×11): 100 mg via ORAL
  Filled 2015-06-05 (×11): qty 1

## 2015-06-05 MED ORDER — SODIUM CHLORIDE 0.9 % IV SOLN: INTRAVENOUS | Status: DC

## 2015-06-05 MED ORDER — ONDANSETRON HCL 4 MG PO TABS: 4.0000 mg | ORAL_TABLET | Freq: Four times a day (QID) | ORAL | Status: DC | PRN

## 2015-06-05 MED ORDER — ALLOPURINOL 100 MG PO TABS
200.0000 mg | ORAL_TABLET | Freq: Every day | ORAL | Status: DC
  Administered 2015-06-06 – 2015-06-11 (×6): 200 mg via ORAL
  Filled 2015-06-05 (×6): qty 2

## 2015-06-05 MED ORDER — CLOPIDOGREL BISULFATE 75 MG PO TABS: 75.0000 mg | ORAL_TABLET | Freq: Every day | ORAL | Status: DC

## 2015-06-05 MED ORDER — VANCOMYCIN HCL IN DEXTROSE 1-5 GM/200ML-% IV SOLN
1000.0000 mg | INTRAVENOUS | Status: DC
  Administered 2015-06-07 – 2015-06-09 (×2): 1000 mg via INTRAVENOUS
  Filled 2015-06-05 (×3): qty 200

## 2015-06-05 MED ORDER — ASPIRIN EC 81 MG PO TBEC
81.0000 mg | DELAYED_RELEASE_TABLET | Freq: Every day | ORAL | Status: DC
Start: 2015-06-05 — End: 2015-06-11
  Administered 2015-06-06 – 2015-06-11 (×6): 81 mg via ORAL
  Filled 2015-06-05 (×6): qty 1

## 2015-06-05 MED ORDER — ONDANSETRON HCL 4 MG/2ML IJ SOLN: 4.0000 mg | Freq: Four times a day (QID) | INTRAMUSCULAR | Status: DC | PRN

## 2015-06-05 MED ORDER — ACETAMINOPHEN 325 MG PO TABS
650.0000 mg | ORAL_TABLET | Freq: Four times a day (QID) | ORAL | Status: DC | PRN
  Administered 2015-06-08: 650 mg via ORAL
  Filled 2015-06-05 (×2): qty 2

## 2015-06-05 MED ORDER — INSULIN GLARGINE 100 UNIT/ML ~~LOC~~ SOLN
30.0000 [IU] | Freq: Every day | SUBCUTANEOUS | Status: DC
  Administered 2015-06-05 – 2015-06-09 (×5): 30 [IU] via SUBCUTANEOUS
  Filled 2015-06-05 (×5): qty 0.3

## 2015-06-05 NOTE — ED Notes (Signed)
Right inner ankle blister.

## 2015-06-05 NOTE — ED Notes (Signed)
MD at bedside. 

## 2015-06-05 NOTE — ED Provider Notes (Addendum)
[facility] Emergency Department Provider Note  ____________________________________________  Time seen: Seen upon arrival to the emergency department  I have reviewed the triage vital signs and the nursing notes.   HISTORY  Chief Complaint Leg Pain    HPI DORI DEVINO is a 81 y.o. female with a history of peripheral vascular disease with multiple attempts at revascularization of her right leg who is presenting today with increasing right leg pain and redness. The patient is a patient of Dr. Marijean Heath.  The family is also the bedside and said that the redness has worsened since last night. He also points several bullae on the lower extremity which they say they have just occurred over the last 1-2 days. The patient has also been experiencing increasing pain to the right lower extremity since being admitted to [facility] this past Saturday.   Past Medical History  Diagnosis Date  . Cancer   . Blood transfusion without reported diagnosis   . CHF (congestive heart failure)   . Coronary artery disease   . Hypertension   . Shortness of breath dyspnea   . Diabetes mellitus without complication   . Peripheral vascular disease     Patient Active Problem List   Diagnosis Date Noted  . Ischemia of lower extremity 05/26/2015  . Aortic heart valve narrowing 05/26/2015  . Arthritis urica 05/26/2015  . H/O angina pectoris 05/26/2015  . Peripheral blood vessel disorder 05/26/2015  . Arteriosclerosis of coronary artery 01/25/2014  . Heart failure 01/25/2014  . Chronic kidney disease (CKD), stage III (moderate) 01/25/2014  . HLD (hyperlipidemia) 01/25/2014  . BP (high blood pressure) 01/25/2014  . Arthritis, degenerative 01/25/2014  . Hypertensive pulmonary vascular disease 01/25/2014  . Cardiac failure right 01/25/2014  . Diabetes mellitus, type 2 01/25/2014  . Herpes zona 07/14/2009    Past Surgical History  Procedure Laterality Date  . Back surgery     . Coronary artery bypass graft    . Vascular surgery    . Peripheral vascular catheterization Right 05/27/2015    Procedure: Lower Extremity Angiography;  Surgeon: Renford Dills, MD;  Location: [facility] INVASIVE CV LAB;  Service: Cardiovascular;  Laterality: Right;  . Peripheral vascular catheterization  05/27/2015    Procedure: Lower Extremity Intervention;  Surgeon: Renford Dills, MD;  Location: [facility] INVASIVE CV LAB;  Service: Cardiovascular;;    Current Outpatient Rx  Name  Route  Sig  Dispense  Refill  . acetaminophen (TYLENOL) 325 MG tablet   Oral   Take 2 tablets (650 mg total) by mouth every 6 (six) hours as needed for mild pain (or Fever >/= 101).         Marland Kitchen acetaminophen (TYLENOL) 650 MG suppository   Rectal   Place 1 suppository (650 mg total) rectally every 6 (six) hours as needed for mild pain (or Fever >/= 101).   12 suppository   0   . allopurinol (ZYLOPRIM) 100 MG tablet   Oral   Take 200 mg by mouth daily.         Marland Kitchen ALPRAZolam (XANAX) 0.25 MG tablet      One tab twice a day as needed for anxiety   30 tablet   0   . aspirin EC 81 MG tablet   Oral   Take 81 mg by mouth daily.         Marland Kitchen atorvastatin (LIPITOR) 40 MG tablet   Oral   Take 20 mg by mouth at bedtime.          Marland Kitchen  azelastine (ASTELIN) 0.1 % nasal spray   Each Nare   Place 1 spray into both nostrils as needed for rhinitis. Use in each nostril as directed         . bisoprolol-hydrochlorothiazide (ZIAC) 10-6.25 MG per tablet   Oral   Take 1 tablet by mouth 2 (two) times daily.         . clopidogrel (PLAVIX) 75 MG tablet   Oral   Take 75 mg by mouth daily.         Marland Kitchen docusate sodium (COLACE) 100 MG capsule   Oral   Take 1 capsule (100 mg total) by mouth 2 (two) times daily.   10 capsule   0   . escitalopram (LEXAPRO) 5 MG tablet   Oral   Take 1 tablet (5 mg total) by mouth daily.         . famotidine (PEPCID) 20 MG tablet   Oral   Take 1 tablet (20 mg total) by mouth 2  (two) times daily.         . fexofenadine (ALLEGRA) 60 MG tablet   Oral   Take 60 mg by mouth as needed for allergies or rhinitis.         Marland Kitchen insulin aspart (NOVOLOG) 100 UNIT/ML injection   Subcutaneous   Inject 0-24 Units into the skin every 4 (four) hours.   10 mL   11   . insulin glargine (LANTUS) 100 UNIT/ML injection   Subcutaneous   Inject 60 Units into the skin at bedtime.         . insulin glargine (LANTUS) 100 UNIT/ML injection   Subcutaneous   Inject 0.6 mLs (60 Units total) into the skin Nightly.   10 mL   11   . insulin lispro (HUMALOG) 100 UNIT/ML injection   Subcutaneous   Inject 30 Units into the skin 3 (three) times daily before meals.         . isosorbide mononitrate (IMDUR) 60 MG 24 hr tablet   Oral   Take 60 mg by mouth daily.         Marland Kitchen losartan-hydrochlorothiazide (HYZAAR) 100-25 MG per tablet   Oral   Take 1 tablet by mouth daily.         . metolazone (ZAROXOLYN) 2.5 MG tablet   Oral   Take 2.5 mg by mouth every other day.         . Multiple Vitamin (MULTIVITAMIN) tablet   Oral   Take 1 tablet by mouth daily.         . nitroGLYCERIN (NITRODUR - DOSED IN MG/24 HR) 0.4 mg/hr patch   Transdermal   Place 1 patch (0.4 mg total) onto the skin every 12 (twelve) hours as needed (Angina). Place one patch onto skin as needed. Then remove for 12 hours.   30 patch   12   . omeprazole (PRILOSEC) 20 MG capsule   Oral   Take 20 mg by mouth daily as needed.         Marland Kitchen oxyCODONE-acetaminophen (PERCOCET/ROXICET) 5-325 MG per tablet      One to two tabs every four hours as needed for pain   30 tablet   0   . potassium chloride SA (K-DUR,KLOR-CON) 20 MEQ tablet   Oral   Take 40 mEq by mouth daily.         . promethazine (PHENERGAN) 12.5 MG tablet   Oral   Take 1 tablet (12.5 mg total) by mouth every 8 (  eight) hours as needed for nausea or vomiting.   21 tablet   0   . torsemide (DEMADEX) 20 MG tablet   Oral   Take 20 mg by  mouth daily.           Allergies Dilaudid and Morphine and related  Family History  Problem Relation Age of Onset  . Diabetes Mother   . Diabetes Father     Social History Social History  Substance Use Topics  . Smoking status: Never Smoker   . Smokeless tobacco: None  . Alcohol Use: No    Review of Systems Constitutional: No fever/chills Eyes: No visual changes. ENT: No sore throat. Cardiovascular: Denies chest pain. Respiratory: Denies shortness of breath. Gastrointestinal: No abdominal pain.  No nausea, no vomiting.  No diarrhea.  No constipation. Genitourinary: Negative for dysuria. Musculoskeletal: Negative for back pain. Skin: Erythema to the right lower extremity Neurological: Negative for headaches, focal weakness or numbness.  10-point ROS otherwise negative.  ____________________________________________   PHYSICAL EXAM:  VITAL SIGNS: ED Triage Vitals  Enc Vitals Group     BP 06/05/15 1754 113/55 mmHg     Pulse Rate 06/05/15 1754 80     Resp 06/05/15 1754 19     Temp 06/05/15 1754 99.4 F (37.4 C)     Temp Source 06/05/15 1754 Oral     SpO2 06/05/15 1754 99 %     Weight 06/05/15 1754 218 lb 11.2 oz (99.202 kg)     Height 06/05/15 1754  (1.499 m)     Head Cir --      [facility] --      Pain Score 06/05/15 1748 7     Pain Loc --      Pain Edu? --      Excl. in GC? --     Constitutional: Alert and oriented. Well appearing and in no acute distress. Eyes: Conjunctivae are normal. PERRL. EOMI. Head: Atraumatic. Nose: No congestion/rhinnorhea. Mouth/Throat: Mucous membranes are moist.  Oropharynx non-erythematous. Neck: No stridor.   Cardiovascular: Normal rate, regular rhythm. Grossly normal heart sounds.  Respiratory: Normal respiratory effort.  No retractions. Lungs CTAB. Gastrointestinal: Soft and nontender. No distention. No abdominal bruits. No CVA tenderness. Musculoskeletal: Right upper extremity in forearm splint. Patient  neurovascular intact with brisk capillary refill less than 2 seconds. Able to range the fingers and denies any pain to this limb. He is splinted secondary to fall with a fracture.  Right lower extremity with erythema from just proximal to the ankle to just proximal to the knee. There is skin mottling to the dorsum of the foot without a palpable dorsalis pedis or posterior tibial pulse. There were also no pulses detected by Doppler ultrasound. The foot is cool to touch.   The right ear to the toes are starting to turn black. The patient has decreased sensation to the right lower extremity. There are 2 bullae 1 laterally and one medially. The lateral pole is about 2 x 3 cm and is not tense. It is full with serous fluid. Also a 2 x 4 cm non-tense bulla with serous fluid to the medial portion of the leg.   Neurologic:  Normal speech and language.  Skin:  See musculoskeletal exam Psychiatric: Mood and affect are normal. Speech and behavior are normal.  ____________________________________________   LABS (all labs ordered are listed, but only abnormal results are displayed)  Labs Reviewed  CBC WITH DIFFERENTIAL/PLATELET - Abnormal; Notable for the following:  WBC 11.2 (*)    RBC 2.55 (*)    Hemoglobin 7.3 (*)    HCT 23.0 (*)    MCHC 31.8 (*)    RDW 16.2 (*)    Neutro Abs 8.9 (*)    Monocytes Absolute 1.0 (*)    All other components within normal limits  COMPREHENSIVE METABOLIC PANEL - Abnormal; Notable for the following:    Chloride 96 (*)    CO2 33 (*)    Glucose, Bld 196 (*)    BUN 47 (*)    Creatinine, Ser 2.14 (*)    Calcium 8.1 (*)    Total Protein 6.1 (*)    Albumin 2.0 (*)    AST 42 (*)    GFR calc non Af Amer 19 (*)    GFR calc Af Amer 23 (*)    All other components within normal limits  TROPONIN I - Abnormal; Notable for the following:    Troponin I 0.06 (*)    All other components within normal limits  PROTIME-INR - Abnormal; Notable for the following:    Prothrombin  Time 15.8 (*)    All other components within normal limits  CULTURE, BLOOD (ROUTINE X 2)  CULTURE, BLOOD (ROUTINE X 2)  APTT  TYPE AND SCREEN   ____________________________________________  EKG  ED ECG REPORT I, Arelia Longest, the attending physician, personally viewed and interpreted this ECG.   Date: 06/05/2015  EKG Time: 1753  Rate: 77  Rhythm: Normal sinus rhythm  Axis: Normal axis  Intervals:none  ST&T Change: T wave inversions in 1, 2, aVL as well as V4 through V6. No new T-wave inversions since EKG on 05/18/2015. However the inversions are deeper.  ____________________________________________  RADIOLOGY  Stable cardiomegaly with pulmonary vascular congestion and mild interstitial lung disease. I personally reviewed these films. ____________________________________________   PROCEDURES ____________________________________________   INITIAL IMPRESSION / ASSESSMENT AND PLAN / ED COURSE  Pertinent labs & imaging results that were available during my care of the patient were reviewed by me and considered in my medical decision making (see chart for details).  ----------------------------------------- 6:45 PM on 06/05/2015 -----------------------------------------  Discussed case with Dr. Wyn Quaker who will see the patient as an inpatient.  Says that he will likely amputate  ----------------------------------------- 7:50 PM on 06/05/2015 -----------------------------------------  Updated family and patient's about lab results. The patient will need to be admitted for cellulitis of this leg likely requiring amputation. Signed out to Dr. Allena Katz.  ____________________________________________  ____________________________________________   FINAL CLINICAL IMPRESSION(S) / ED DIAGNOSES  Cellulitic right lower extremity likely secondary to poor vascularization. Necrotic toes to the right foot. Return visit.    Myrna Blazer, MD 06/05/15 [phone]  Power of  attorney requested to be called with Dr. dew is here to see patient as consult. Discussed with the nurse, but, he says he will pass it on when he signed out the patient to 4 hours. The sons contact information is in the demographic section of the patient's chart.  Myrna Blazer, MD 06/05/15 2033

## 2015-06-05 NOTE — H&P (Signed)
[facility] [place] at [facility]   PATIENT NAME: [name]    MR#:  [id number]  DATE OF BIRTH:  08/25/1925   DATE OF ADMISSION:  06/05/2015  PRIMARY CARE PHYSICIAN: Danella Penton., MD   REQUESTING/REFERRING PHYSICIAN: [place]  CHIEF COMPLAINT:   Chief Complaint  Patient presents with  . Leg Pain    right    HISTORY OF PRESENT ILLNESS:  [name]  is a 81 y.o. female with a known history of coronary artery disease status post bypass, known peripheral vascular disease recent arteriogram right lower extremity with unsuccessful revascularization presenting with right leg pain. She describes worsening right leg pain for the past 1-2 day duration with associated increased redness as well as bullae formation. She describes only has "pain" intensity 10/10 worse with movement or activity no relieving factors radiation. Case discussed with Dr. Wyn Quaker of vascular surgery by emergency department staff who will evaluate the patient further in the morning however will likely require amputation. Aside from the leg pain other leg associated symptoms she does attests to having shortness of breath which is chronic denies any chest pain or further symptomatology at this time  PAST MEDICAL HISTORY:   Past Medical History  Diagnosis Date  . Cancer   . Blood transfusion without reported diagnosis   . CHF (congestive heart failure)   . Coronary artery disease   . Hypertension   . Shortness of breath dyspnea   . Diabetes mellitus without complication   . Peripheral vascular disease     PAST SURGICAL HISTORY:   Past Surgical History  Procedure Laterality Date  . Back surgery    . Coronary artery bypass graft    . Vascular surgery    . Peripheral vascular catheterization Right 05/27/2015    Procedure: Lower Extremity Angiography;  Surgeon: Renford Dills, MD;  Location: [facility] INVASIVE CV LAB;  Service: Cardiovascular;  Laterality: Right;  . Peripheral  vascular catheterization  05/27/2015    Procedure: Lower Extremity Intervention;  Surgeon: Renford Dills, MD;  Location: [facility] INVASIVE CV LAB;  Service: Cardiovascular;;    SOCIAL HISTORY:   Social History  Substance Use Topics  . Smoking status: Never Smoker   . Smokeless tobacco: Not on file  . Alcohol Use: No    FAMILY HISTORY:   Family History  Problem Relation Age of Onset  . Diabetes Mother   . Diabetes Father     DRUG ALLERGIES:   Allergies  Allergen Reactions  . Dilaudid [Hydromorphone Hcl] Nausea And Vomiting  . Morphine And Related Hives and Other (See Comments)    Reaction:  Lightheadedness     REVIEW OF SYSTEMS:  REVIEW OF SYSTEMS:  CONSTITUTIONAL: Denies fevers, chills, fatigue, weakness.  EYES: Denies blurred vision, double vision, or eye pain.  EARS, NOSE, THROAT: Denies tinnitus, ear pain, hearing loss.  RESPIRATORY: denies cough, positive shortness of breath, denies wheezing  CARDIOVASCULAR: Denies chest pain, palpitations, edema.  GASTROINTESTINAL: Denies nausea, vomiting, diarrhea, abdominal pain.  GENITOURINARY: Denies dysuria, hematuria.  ENDOCRINE: Denies nocturia or thyroid problems. HEMATOLOGIC AND LYMPHATIC: Denies easy bruising or bleeding.  SKIN: Positive red rash right leg with ulceration  MUSCULOSKELETAL: Denies pain in neck, back, shoulder, knees, hips, or further arthritic symptoms. Positive right leg pain as described above  NEUROLOGIC: Denies paralysis, paresthesias.  PSYCHIATRIC: Denies anxiety or depressive symptoms. Otherwise full review of systems performed by me is negative.   MEDICATIONS AT HOME:   Prior to Admission medications  Medication Sig Start Date End Date Taking? Authorizing Provider  allopurinol (ZYLOPRIM) 100 MG tablet Take 200 mg by mouth daily.   Yes Historical Provider, MD  aspirin EC 81 MG tablet Take 81 mg by mouth daily.   Yes Historical Provider, MD  atorvastatin (LIPITOR) 20 MG tablet Take 20 mg by  mouth at bedtime.   Yes Historical Provider, MD  docusate sodium (COLACE) 100 MG capsule Take 1 capsule (100 mg total) by mouth 2 (two) times daily. 05/31/15  Yes Kimberly A Stegmayer, PA-C  escitalopram (LEXAPRO) 5 MG tablet Take 1 tablet (5 mg total) by mouth daily. 05/31/15  Yes Kimberly A Stegmayer, PA-C  insulin detemir (LEVEMIR) 100 UNIT/ML injection Inject 60 Units into the skin at bedtime.   Yes Historical Provider, MD  isosorbide mononitrate (IMDUR) 60 MG 24 hr tablet Take 60 mg by mouth daily.   Yes Historical Provider, MD  losartan-hydrochlorothiazide (HYZAAR) 100-25 MG per tablet Take 1 tablet by mouth daily.   Yes Historical Provider, MD  torsemide (DEMADEX) 20 MG tablet Take 20 mg by mouth daily.   Yes Historical Provider, MD  acetaminophen (TYLENOL) 325 MG tablet Take 2 tablets (650 mg total) by mouth every 6 (six) hours as needed for mild pain (or Fever >/= 101). 05/31/15   Kimberly A Stegmayer, PA-C  acetaminophen (TYLENOL) 650 MG suppository Place 1 suppository (650 mg total) rectally every 6 (six) hours as needed for mild pain (or Fever >/= 101). 05/31/15   Kimberly A Stegmayer, PA-C  ALPRAZolam Prudy Feeler) 0.25 MG tablet One tab twice a day as needed for anxiety 05/31/15   Cala Bradford A Stegmayer, PA-C  azelastine (ASTELIN) 0.1 % nasal spray Place 1 spray into both nostrils as needed for rhinitis. Use in each nostril as directed    Historical Provider, MD  bisoprolol-hydrochlorothiazide (ZIAC) 10-6.25 MG per tablet Take 1 tablet by mouth 2 (two) times daily.    Historical Provider, MD  clopidogrel (PLAVIX) 75 MG tablet Take 75 mg by mouth daily.    Historical Provider, MD  famotidine (PEPCID) 20 MG tablet Take 1 tablet (20 mg total) by mouth 2 (two) times daily. 05/31/15   Kimberly A Stegmayer, PA-C  fexofenadine (ALLEGRA) 60 MG tablet Take 60 mg by mouth as needed for allergies or rhinitis.    Historical Provider, MD  insulin aspart (NOVOLOG) 100 UNIT/ML injection Inject 0-24 Units into the  skin every 4 (four) hours. 05/31/15   Kimberly A Stegmayer, PA-C  insulin glargine (LANTUS) 100 UNIT/ML injection Inject 60 Units into the skin at bedtime.    Historical Provider, MD  insulin glargine (LANTUS) 100 UNIT/ML injection Inject 0.6 mLs (60 Units total) into the skin Nightly. 05/31/15 07/04/16  Kimberly A Stegmayer, PA-C  insulin lispro (HUMALOG) 100 UNIT/ML injection Inject 30 Units into the skin 3 (three) times daily before meals.    Historical Provider, MD  metolazone (ZAROXOLYN) 2.5 MG tablet Take 2.5 mg by mouth every other day.    Historical Provider, MD  Multiple Vitamin (MULTIVITAMIN) tablet Take 1 tablet by mouth daily.    Historical Provider, MD  nitroGLYCERIN (NITRODUR - DOSED IN MG/24 HR) 0.4 mg/hr patch Place 1 patch (0.4 mg total) onto the skin every 12 (twelve) hours as needed (Angina). Place one patch onto skin as needed. Then remove for 12 hours. 05/31/15   Kimberly A Stegmayer, PA-C  omeprazole (PRILOSEC) 20 MG capsule Take 20 mg by mouth daily as needed.    Historical Provider, MD  oxyCODONE-acetaminophen (PERCOCET/ROXICET) 5-325  MG per tablet One to two tabs every four hours as needed for pain 05/31/15   Ranae Plumber Stegmayer, PA-C  potassium chloride SA (K-DUR,KLOR-CON) 20 MEQ tablet Take 40 mEq by mouth daily.    Historical Provider, MD  promethazine (PHENERGAN) 12.5 MG tablet Take 1 tablet (12.5 mg total) by mouth every 8 (eight) hours as needed for nausea or vomiting. 05/18/15   Jennye Moccasin, MD      VITAL SIGNS:  Blood pressure 150/69, pulse 77, temperature 99.4 F (37.4 C), temperature source Oral, resp. rate 16, height  (1.499 m), weight 218 lb 11.2 oz (99.202 kg), SpO2 99 %.  PHYSICAL EXAMINATION:  VITAL SIGNS: Filed Vitals:   06/05/15 2039  BP: 150/69  Pulse: 77  Temp:   Resp:    GENERAL:81 y.o.female currently in moderate distress given acute pain, chronically ill-appearing HEAD: Normocephalic, atraumatic.  EYES: Pupils equal, round, reactive to  light. Extraocular muscles intact. No scleral icterus.  MOUTH: Moist mucosal membrane. Dentition intact. No abscess noted.  EAR, NOSE, THROAT: Clear without exudates. No external lesions.  NECK: Supple. No thyromegaly. No nodules. No JVD.  PULMONARY: Clear to ascultation, without wheeze rails or rhonci. Tachypneic without  use of accessory muscles, Good respiratory effort. good air entry bilaterally CHEST: Nontender to palpation.  CARDIOVASCULAR: S1 and S2. Regular rate and rhythm. No murmurs, rubs, or gallops. No edema.  GASTROINTESTINAL: Soft, nontender, nondistended. No masses. Positive bowel sounds. No hepatosplenomegaly.  MUSCULOSKELETAL: Right hand swelling/edema most prominent fingers, right upper extremity in sling. Right lower extremity range of motion limited given pain. No further swelling, clubbing, or edema. Range of motion otherwise full in all extremities.  NEUROLOGIC: Cranial nerves II through XII are intact. No gross focal neurological deficits. Sensation intact. Reflexes intact.  SKIN: Erythematous right lower extremity from ankle upward midshin with associated bullae formation on the medial aspect of the shin, there are distal areas of dusky skin/necrosis PSYCHIATRIC: Mood, affect flattened. The patient is awake, alert and oriented x 3. Insight, judgment intact.    LABORATORY PANEL:   CBC  Recent Labs Lab 06/05/15 1816  WBC 11.2*  HGB 7.3*  HCT 23.0*  PLT 399   ------------------------------------------------------------------------------------------------------------------  Chemistries   Recent Labs Lab 06/05/15 1816  NA 138  K 4.5  CL 96*  CO2 33*  GLUCOSE 196*  BUN 47*  CREATININE 2.14*  CALCIUM 8.1*  AST 42*  ALT 42  ALKPHOS 96  BILITOT 0.5   ------------------------------------------------------------------------------------------------------------------  Cardiac Enzymes  Recent Labs Lab 06/05/15 1816  TROPONINI 0.06*    ------------------------------------------------------------------------------------------------------------------  RADIOLOGY:  Dg Chest 1 View  06/05/2015   CLINICAL DATA:  Right humerus fracture.  Preoperative evaluation.  EXAM: CHEST 1 VIEW  COMPARISON:  05/28/2015.  FINDINGS: Stable enlarged cardiac silhouette and post CABG changes. The pulmonary vasculature and interstitial markings remain prominent. No pleural fluid. Bilateral shoulder degenerative changes with superior migration of the humeral heads, compatible with large, chronic bilateral rotator cuff tears. Cervical spine degenerative changes.  IMPRESSION: Stable cardiomegaly, pulmonary vascular congestion and mild chronic interstitial lung disease.   Electronically Signed   By: Beckie Salts M.D.   On: 06/05/2015 18:44    EKG:   Orders placed or performed during the hospital encounter of 05/18/15  . EKG 12-Lead  . EKG 12-Lead  . ED EKG  . ED EKG  . EKG    IMPRESSION AND PLAN:   81 year old Caucasian female history of known coronary artery disease status post bypass surgery  as well as peripheral vascular disease presenting with leg pain area  1. Ischemic right leg: Case discussed with vascular surgery by emergency department staff with ongoing plan for amputation. Started on vancomycin by emergency department staff will continue antibiotics coverage for now, provide pain medication. Discussed with her son at bedside concern for mild elevation in troponin will need to follow the cardiac enzymes prior to surgery. Given her history of cardiac disease and peripheral vascular disease she should be considered moderate risk for a moderate risk surgery from a cardiac standpoint pending the cardiac enzymes and assuming they are normal she would require no further workup or investigation from cardiac standpoint prior to surgery. 2. Elevated troponin: A known history of coronary artery disease appears to have more prominent T inversion in  anterolateral leads denies symptoms related to this will place on telemetry trend cardiac enzymes. With chronic kidney disease mild elevation in enzyme as expected. 3. Coronary artery disease without angina: Aspirin/Plavix/statin therapy 4. Type 2 diabetes insulin requiring decreased basal insulin given nothing by mouth status, at insulin sliding scale 5. Venous thromboembolism prophylactic: We'll use SCDs avoiding the right lower extremity   All the records are reviewed and case discussed with ED provider. Management plans discussed with the patient, family and they are in agreement.  CODE STATUS: DO NOT RESUSCITATE/DO NOT INTUBATE  TOTAL TIME TAKING CARE OF THIS PATIENT: 45 minutes.    Hower,  Mardi Mainland.D on 06/05/2015 at 8:44 PM  Between 7am to 6pm - Pager - [phone]  After 6pm: House Pager: - [phone]  [facility]  Office  [phone]  CC: Primary care physician; Danella Penton., MD

## 2015-06-05 NOTE — Progress Notes (Signed)
ANTIBIOTIC CONSULT NOTE - INITIAL  Pharmacy Consult for Vancomycin Indication: Cellulitis  Allergies  Allergen Reactions  . Dilaudid [Hydromorphone Hcl] Nausea And Vomiting    Pt experienced severe NV and constant dry heaves with no relief from the pain.   Marland Kitchen Morphine And Related Hives and Other (See Comments)    lightheaded    Patient Measurements: Height:  (149.9 cm) Weight: 218 lb 11.2 oz (99.202 kg) IBW/kg (Calculated) : 43.2 Adjusted Body Weight: 65.6 kg  Vital Signs: Temp: 99.4 F (37.4 C) (09/22 1754) Temp Source: Oral (09/22 1754) BP: 113/55 mmHg (09/22 1754) Pulse Rate: 80 (09/22 1754) Intake/Output from previous day:   Intake/Output from this shift:    Labs: No results for input(s): WBC, HGB, PLT, LABCREA, CREATININE in the last 72 hours. Estimated Creatinine Clearance: 16.5 mL/min (by C-G formula based on Cr of 2.44). No results for input(s): VANCOTROUGH, VANCOPEAK, VANCORANDOM, GENTTROUGH, GENTPEAK, GENTRANDOM, TOBRATROUGH, TOBRAPEAK, TOBRARND, AMIKACINPEAK, AMIKACINTROU, AMIKACIN in the last 72 hours.   Microbiology: No results found for this or any previous visit (from the past 720 hour(s)).  Medical History: Past Medical History  Diagnosis Date  . Cancer   . Blood transfusion without reported diagnosis   . CHF (congestive heart failure)   . Coronary artery disease   . Hypertension   . Shortness of breath dyspnea   . Diabetes mellitus without complication   . Peripheral vascular disease     Medications:  Scheduled:   Assessment: 81 yo female who presented to the ED c/o right leg pain/weakness with swelling and redness, possible cellulitis. Pharmacy consulted for vancomycin dosing.  PK parameters: Dosing Weight: 65.6 kg Ke: 0.02 T1/2: 35 hrs Vd: 45.92 kg  Goal of Therapy:  Vancomycin trough level 10-15 mcg/ml  Plan:  Pt received vancomycin x1 in the ED. Will start vancomycin  IV Q48H starting ~36 hours after first dose for  stacked dosing. Trough prior to 3rd dose (will not be at steady state).  Pharmacy will continue to follow with you.  Jacqualyn Posey, PharmD Clinical Pharmacist 06/05/2015,6:29 PM

## 2015-06-05 NOTE — ED Notes (Addendum)
Pt comes into the ED via EMS from [facility].  Patient is c/o right leg pain, weakness, and unable to place pressure on the leg.  Patient presents with blister, swelling, and redness to that lower leg.  History of angioplasty in the right leg previously.  Patient was given oxycodone from the facility prior to transportation.  136/80, 96% @ 2L, CBG 277. Patient recently has undergone surgery on her right arm.

## 2015-06-06 ENCOUNTER — Inpatient Hospital Stay: Payer: Medicare Other | Admitting: Anesthesiology

## 2015-06-06 ENCOUNTER — Encounter
Admission: EM | Disposition: A | Discharge status: Skilled Nursing Facility | Payer: Self-pay | Source: Home / Self Care | Attending: Internal Medicine

## 2015-06-06 ENCOUNTER — Inpatient Hospital Stay: Payer: Medicare Other

## 2015-06-06 ENCOUNTER — Encounter: Payer: Self-pay | Admitting: Anesthesiology

## 2015-06-06 HISTORY — PX: AMPUTATION: SHX166

## 2015-06-06 LAB — CBC
HEMATOCRIT: 23 % — AB (ref 35.0–47.0)
HEMOGLOBIN: 7.2 g/dL — AB (ref 12.0–16.0)
MCH: 28.4 pg (ref 26.0–34.0)
MCHC: 31.5 g/dL — ABNORMAL LOW (ref 32.0–36.0)
MCV: 90.2 fL (ref 80.0–100.0)
Platelets: 374 10*3/uL (ref 150–440)
RBC: 2.55 MIL/uL — AB (ref 3.80–5.20)
RDW: 16.8 % — ABNORMAL HIGH (ref 11.5–14.5)
WBC: 11.1 10*3/uL — ABNORMAL HIGH (ref 3.6–11.0)

## 2015-06-06 LAB — GLUCOSE, CAPILLARY
Glucose-Capillary: 245 mg/dL — ABNORMAL HIGH (ref 65–99)
Glucose-Capillary: 226 mg/dL — ABNORMAL HIGH (ref 65–99)
Glucose-Capillary: 157 mg/dL — ABNORMAL HIGH (ref 65–99)

## 2015-06-06 LAB — BASIC METABOLIC PANEL
ANION GAP: 8 (ref 5–15)
BUN: 45 mg/dL — ABNORMAL HIGH (ref 6–20)
CALCIUM: 7.9 mg/dL — AB (ref 8.9–10.3)
CHLORIDE: 96 mmol/L — AB (ref 101–111)
CO2: 32 mmol/L (ref 22–32)
Creatinine, Ser: 1.95 mg/dL — ABNORMAL HIGH (ref 0.44–1.00)
GFR calc non Af Amer: 22 mL/min — ABNORMAL LOW (ref >60)
GFR, EST AFRICAN AMERICAN: 25 mL/min — AB (ref >60)
GLUCOSE: 194 mg/dL — AB (ref 65–99)
POTASSIUM: 4.4 mmol/L (ref 3.5–5.1)
Sodium: 136 mmol/L (ref 135–145)

## 2015-06-06 LAB — MRSA PCR SCREENING: MRSA BY PCR: NEGATIVE

## 2015-06-06 LAB — PREPARE RBC (CROSSMATCH): Order Confirmation: POSITIVE

## 2015-06-06 LAB — TROPONIN I
TROPONIN I: 0.06 ng/mL — AB (ref <0.031)
Troponin I: 0.05 ng/mL — ABNORMAL HIGH (ref <0.031)

## 2015-06-06 SURGERY — AMPUTATION, ABOVE KNEE
Anesthesia: General | Laterality: Right

## 2015-06-06 MED ORDER — ONDANSETRON HCL 4 MG/2ML IJ SOLN: 4.0000 mg | Freq: Once | INTRAMUSCULAR | Status: DC | PRN

## 2015-06-06 MED ORDER — KETAMINE HCL 50 MG/ML IJ SOLN
INTRAMUSCULAR | Status: DC | PRN
  Administered 2015-06-06: 25 mg via INTRAVENOUS

## 2015-06-06 MED ORDER — EPHEDRINE SULFATE 50 MG/ML IJ SOLN
INTRAMUSCULAR | Status: DC | PRN
Start: 2015-06-06 — End: 2015-06-06
  Administered 2015-06-06: 10 mg via INTRAVENOUS

## 2015-06-06 MED ORDER — FENTANYL CITRATE (PF) 100 MCG/2ML IJ SOLN
INTRAMUSCULAR | Status: DC | PRN
  Administered 2015-06-06 (×2): 50 ug via INTRAVENOUS

## 2015-06-06 MED ORDER — SUCCINYLCHOLINE CHLORIDE 20 MG/ML IJ SOLN
INTRAMUSCULAR | Status: DC | PRN
  Administered 2015-06-06: 100 mg via INTRAVENOUS

## 2015-06-06 MED ORDER — CETYLPYRIDINIUM CHLORIDE 0.05 % MT LIQD
7.0000 mL | Freq: Two times a day (BID) | OROMUCOSAL | Status: DC
  Administered 2015-06-06 – 2015-06-11 (×7): 7 mL via OROMUCOSAL

## 2015-06-06 MED ORDER — CEFAZOLIN SODIUM 1-5 GM-% IV SOLN
INTRAVENOUS | Status: DC | PRN
  Administered 2015-06-06: 1 g via INTRAVENOUS

## 2015-06-06 MED ORDER — METHYLPREDNISOLONE SODIUM SUCC 125 MG IJ SOLR
INTRAMUSCULAR | Status: DC | PRN
  Administered 2015-06-06: 125 mg via INTRAVENOUS

## 2015-06-06 MED ORDER — HYDROMORPHONE HCL 1 MG/ML IJ SOLN
0.5000 mg | Freq: Once | INTRAMUSCULAR | Status: AC
  Administered 2015-06-06: 0.5 mg via INTRAVENOUS
  Filled 2015-06-06: qty 1

## 2015-06-06 MED ORDER — PHENYLEPHRINE HCL 10 MG/ML IJ SOLN
INTRAMUSCULAR | Status: DC | PRN
Start: 2015-06-06 — End: 2015-06-06
  Administered 2015-06-06 (×2): 50 ug via INTRAVENOUS
  Administered 2015-06-06 (×2): 100 ug via INTRAVENOUS

## 2015-06-06 MED ORDER — ACETAMINOPHEN 325 MG PO TABS
650.0000 mg | ORAL_TABLET | Freq: Once | ORAL | Status: AC
  Administered 2015-06-06: 650 mg via ORAL

## 2015-06-06 MED ORDER — DEXTROSE-NACL 5-0.45 % IV SOLN
INTRAVENOUS | Status: DC
  Administered 2015-06-06 – 2015-06-10 (×6): via INTRAVENOUS

## 2015-06-06 MED ORDER — ONDANSETRON HCL 4 MG/2ML IJ SOLN
INTRAMUSCULAR | Status: DC | PRN
  Administered 2015-06-06: 4 mg via INTRAVENOUS

## 2015-06-06 MED ORDER — PNEUMOCOCCAL VAC POLYVALENT 25 MCG/0.5ML IJ INJ
0.5000 mL | INJECTION | INTRAMUSCULAR | Status: AC
  Administered 2015-06-07: 0.5 mL via INTRAMUSCULAR
  Filled 2015-06-06: qty 0.5

## 2015-06-06 MED ORDER — HYDROCODONE-ACETAMINOPHEN 5-325 MG PO TABS
1.0000 | ORAL_TABLET | Freq: Four times a day (QID) | ORAL | Status: DC | PRN
  Administered 2015-06-07: 2 via ORAL
  Administered 2015-06-07 (×2): 1 via ORAL
  Administered 2015-06-07 – 2015-06-08 (×3): 2 via ORAL
  Administered 2015-06-08: 1 via ORAL
  Administered 2015-06-09 (×2): 2 via ORAL
  Administered 2015-06-10: 1 via ORAL
  Filled 2015-06-06: qty 2
  Filled 2015-06-06: qty 1
  Filled 2015-06-06: qty 2
  Filled 2015-06-06: qty 1
  Filled 2015-06-06 (×3): qty 2
  Filled 2015-06-06: qty 1
  Filled 2015-06-06: qty 2
  Filled 2015-06-06: qty 1

## 2015-06-06 MED ORDER — FENTANYL CITRATE (PF) 100 MCG/2ML IJ SOLN: 25.0000 ug | INTRAMUSCULAR | Status: DC | PRN

## 2015-06-06 MED ORDER — PROPOFOL 10 MG/ML IV BOLUS
INTRAVENOUS | Status: DC | PRN
  Administered 2015-06-06: 70 mg via INTRAVENOUS

## 2015-06-06 MED ORDER — SODIUM CHLORIDE 0.9 % IV SOLN
INTRAVENOUS | Status: DC | PRN
  Administered 2015-06-06: 17:00:00 via INTRAVENOUS

## 2015-06-06 MED ORDER — PANTOPRAZOLE SODIUM 40 MG IV SOLR
40.0000 mg | Freq: Two times a day (BID) | INTRAVENOUS | Status: DC
  Administered 2015-06-06 – 2015-06-07 (×4): 40 mg via INTRAVENOUS
  Filled 2015-06-06 (×4): qty 40

## 2015-06-06 MED ORDER — HYDROMORPHONE HCL 1 MG/ML IJ SOLN
0.5000 mg | INTRAMUSCULAR | Status: DC | PRN
Start: 2015-06-06 — End: 2015-06-11
  Administered 2015-06-06 – 2015-06-10 (×6): 0.5 mg via INTRAVENOUS
  Filled 2015-06-06 (×6): qty 1

## 2015-06-06 MED ORDER — INFLUENZA VAC SPLIT QUAD 0.5 ML IM SUSY
0.5000 mL | PREFILLED_SYRINGE | INTRAMUSCULAR | Status: AC
  Administered 2015-06-07: 0.5 mL via INTRAMUSCULAR
  Filled 2015-06-06: qty 0.5

## 2015-06-06 SURGICAL SUPPLY — 31 items
BANDAGE ELASTIC 6 CLIP NS LF (GAUZE/BANDAGES/DRESSINGS) ×3 IMPLANT
BLADE SAGITTAL WIDE XTHICK NO (BLADE) ×3 IMPLANT
BNDG COHESIVE 4X5 TAN STRL (GAUZE/BANDAGES/DRESSINGS) ×3 IMPLANT
BNDG GAUZE 4.5X4.1 6PLY STRL (MISCELLANEOUS) ×6 IMPLANT
CANISTER SUCT 1200ML W/VALVE (MISCELLANEOUS) ×3 IMPLANT
DRAPE INCISE IOBAN 66X60 STRL (DRAPES) ×3 IMPLANT
DURAPREP 26ML APPLICATOR (WOUND CARE) ×3 IMPLANT
ELECT CAUTERY BLADE 6.4 (BLADE) ×3 IMPLANT
GAUZE PETRO XEROFOAM 1X8 (MISCELLANEOUS) ×6 IMPLANT
GLOVE BIO SURGEON STRL SZ7 (GLOVE) ×3 IMPLANT
GOWN STRL REUS W/ TWL LRG LVL3 (GOWN DISPOSABLE) ×2 IMPLANT
GOWN STRL REUS W/ TWL XL LVL3 (GOWN DISPOSABLE) ×1 IMPLANT
GOWN STRL REUS W/TWL LRG LVL3 (GOWN DISPOSABLE) ×4
GOWN STRL REUS W/TWL XL LVL3 (GOWN DISPOSABLE) ×2
HANDLE YANKAUER SUCT BULB TIP (MISCELLANEOUS) ×3 IMPLANT
KIT RM TURNOVER STRD PROC AR (KITS) ×3 IMPLANT
LABEL OR SOLS (LABEL) ×3 IMPLANT
NS IRRIG 1000ML POUR BTL (IV SOLUTION) ×3 IMPLANT
PACK EXTREMITY ARMC (MISCELLANEOUS) ×3 IMPLANT
PAD ABD DERMACEA PRESS 5X9 (GAUZE/BANDAGES/DRESSINGS) ×6 IMPLANT
PAD GROUND ADULT SPLIT (MISCELLANEOUS) ×3 IMPLANT
PAD PREP 24X41 OB/GYN DISP (PERSONAL CARE ITEMS) ×3 IMPLANT
SPONGE LAP 18X18 5 PK (GAUZE/BANDAGES/DRESSINGS) ×3 IMPLANT
STAPLER SKIN PROX 35W (STAPLE) ×3 IMPLANT
STOCKINETTE M/LG 89821 (MISCELLANEOUS) ×3 IMPLANT
SUT SILK 2 0 (SUTURE) ×2
SUT SILK 2 0 SH (SUTURE) ×6 IMPLANT
SUT SILK 2-0 18XBRD TIE 12 (SUTURE) ×1 IMPLANT
SUT VIC AB 0 CT1 36 (SUTURE) ×6 IMPLANT
SUT VIC AB 0 CT2 27 (SUTURE) ×21 IMPLANT
SUT VIC AB 2-0 CT1 (SUTURE) ×6 IMPLANT

## 2015-06-06 NOTE — Transfer of Care (Signed)
Immediate Anesthesia Transfer of Care Note  Patient: [name]  Procedure(s) Performed: Procedure(s): AMPUTATION ABOVE KNEE (Right)  Patient Location: PACU  Anesthesia Type:General  Level of Consciousness: awake and sedated  Airway & Oxygen Therapy: Patient Spontanous Breathing and Patient connected to face mask oxygen  Post-op Assessment: Report given to RN and Post -op Vital signs reviewed and stable  Post vital signs: Reviewed and stable  Last Vitals:  Filed Vitals:   06/06/15 1637  BP: 129/48  Pulse: 66  Temp: 36.3 C  Resp:     Complications: No apparent anesthesia complications

## 2015-06-06 NOTE — Consult Note (Signed)
[facility] Vascular Consult Note  MRN : [id number]  [name] is a 81 y.o. ([birth year]) female who presents with chief complaint of  Chief Complaint  Patient presents with  . Leg Pain    right  .  History of Present Illness: Patient returns to the hospital with right foot and leg pain with blistering and discoloration developing in the foot and lower leg on the right. She has a long history of non-reconstructable vascular disease with her most recent angiogram being earlier this month. She and her family were aware that she would likely ultimately require an amputation when tissue breakdown developed or her pain became intractable. Her pain is now intractable even with narcotics the relief is minimal. It is severe, 10 out of 10, in the foot and lower leg on the right. Her left leg has some mild hip and knee pain but no ischemic pain. She has no fever or chills.  Current Facility-Administered Medications  Medication Dose Route Frequency Provider Last Rate Last Dose  . acetaminophen (TYLENOL) tablet 650 mg  650 mg Oral Q6H PRN Wyatt Haste, MD       Or  . acetaminophen (TYLENOL) suppository 650 mg  650 mg Rectal Q6H PRN Wyatt Haste, MD      . allopurinol (ZYLOPRIM) tablet 200 mg  200 mg Oral Daily Wyatt Haste, MD   200 mg at 06/06/15 0858  . ALPRAZolam Prudy Feeler) tablet 0.25 mg  0.25 mg Oral TID PRN Wyatt Haste, MD   0.25 mg at 06/06/15 0857  . antiseptic oral rinse (CPC / CETYLPYRIDINIUM CHLORIDE 0.05%) solution 7 mL  7 mL Mouth Rinse BID Danella Penton, MD   7 mL at 06/06/15 0117  . aspirin EC tablet 81 mg  81 mg Oral Daily Wyatt Haste, MD   81 mg at 06/06/15 0859  . atorvastatin (LIPITOR) tablet 20 mg  20 mg Oral QHS Wyatt Haste, MD   20 mg at 06/05/15 2252  . dextrose 5 %-0.45 % sodium chloride infusion   Intravenous Continuous Danella Penton, MD 50 mL/hr at 06/06/15 1026    . docusate sodium (COLACE) capsule 100 mg  100 mg Oral BID Wyatt Haste, MD    100 mg at 06/06/15 0858  . escitalopram (LEXAPRO) tablet 5 mg  5 mg Oral Daily Wyatt Haste, MD   5 mg at 06/06/15 0859  . HYDROmorphone (DILAUDID) injection 0.5 mg  0.5 mg Intravenous Q2H PRN Annice Needy, MD      . Melene Muller ON 06/07/2015] Influenza vac split quadrivalent PF (FLUARIX) injection 0.5 mL  0.5 mL Intramuscular Tomorrow-1000 Danella Penton, MD      . insulin aspart (novoLOG) injection 0-15 Units  0-15 Units Subcutaneous TID WC Wyatt Haste, MD   5 Units at 06/06/15 1343  . insulin aspart (novoLOG) injection 0-5 Units  0-5 Units Subcutaneous QHS Wyatt Haste, MD   0 Units at 06/05/15 2241  . insulin glargine (LANTUS) injection 30 Units  30 Units Subcutaneous QHS Wyatt Haste, MD   30 Units at 06/05/15 2252  . isosorbide mononitrate (IMDUR) 24 hr tablet 60 mg  60 mg Oral Daily Wyatt Haste, MD   60 mg at 06/06/15 0859  . loratadine (CLARITIN) tablet 10 mg  10 mg Oral Daily Wyatt Haste, MD   10 mg at 06/06/15 0858  . multivitamin with minerals tablet 1 tablet  1 tablet Oral  Daily Wyatt Haste, MD   1 tablet at 06/06/15 [phone]  . ondansetron (ZOFRAN) tablet 4 mg  4 mg Oral Q6H PRN Wyatt Haste, MD       Or  . ondansetron [facility]) injection 4 mg  4 mg Intravenous Q6H PRN Wyatt Haste, MD      . pantoprazole (PROTONIX) injection 40 mg  40 mg Intravenous Q12H Danella Penton, MD   40 mg at 06/06/15 0900  . [START ON 06/07/2015] pneumococcal 23 valent vaccine (PNU-IMMUNE) injection 0.5 mL  0.5 mL Intramuscular Tomorrow-1000 Danella Penton, MD      . Melene Muller ON 06/07/2015] vancomycin (VANCOCIN) IVPB 1000 mg/200 mL premix  1,000 mg Intravenous Q48H Myrna Blazer, MD        Past Medical History  Diagnosis Date  . Cancer   . Blood transfusion without reported diagnosis   . CHF (congestive heart failure)   . Coronary artery disease   . Hypertension   . Shortness of breath dyspnea   . Diabetes mellitus without complication   . Peripheral vascular disease     Past Surgical History   Procedure Laterality Date  . Back surgery    . Coronary artery bypass graft    . Vascular surgery    . Peripheral vascular catheterization Right 05/27/2015    Procedure: Lower Extremity Angiography;  Surgeon: Renford Dills, MD;  Location: [facility] INVASIVE CV LAB;  Service: Cardiovascular;  Laterality: Right;  . Peripheral vascular catheterization  05/27/2015    Procedure: Lower Extremity Intervention;  Surgeon: Renford Dills, MD;  Location: [facility] INVASIVE CV LAB;  Service: Cardiovascular;;    Social History Social History  Substance Use Topics  . Smoking status: Never Smoker   . Smokeless tobacco: None  . Alcohol Use: No   in a nursing facility since her last admission  Family History Family History  Problem Relation Age of Onset  . Diabetes Mother   . Diabetes Father    no family history of bleeding disorders or clotting disorders  Allergies  Allergen Reactions  . Dilaudid [Hydromorphone Hcl] Nausea And Vomiting  . Morphine And Related Hives and Other (See Comments)    Reaction:  Lightheadedness      REVIEW OF SYSTEMS (Negative unless checked)  Constitutional: [] Weight loss  [] Fever  [] Chills Cardiac: [] Chest pain   [] Chest pressure   [x] Palpitations   [] Shortness of breath when laying flat   [] Shortness of breath at rest   [] Shortness of breath with exertion. Vascular:  [] Pain in legs with walking   [x] Pain in legs at rest   [] Pain in legs when laying flat   [] Claudication   [] Pain in feet when walking  [x] Pain in feet at rest  [] Pain in feet when laying flat   [] History of DVT   [] Phlebitis   [] Swelling in legs   [] Varicose veins   [] Non-healing ulcers Pulmonary:   [] Uses home oxygen   [] Productive cough   [] Hemoptysis   [] Wheeze  [] COPD   [] Asthma Neurologic:  [] Dizziness  [] Blackouts   [] Seizures   [] History of stroke   [] History of TIA  [] Aphasia   [] Temporary blindness   [] Dysphagia   [] Weakness or numbness in arms   [] Weakness or numbness in legs Musculoskeletal:   [] Arthritis   [] Joint swelling   [x] Joint pain   [] Low back pain Hematologic:  [] Easy bruising  [] Easy bleeding   [] Hypercoagulable state   [x] Anemic  [] Hepatitis Gastrointestinal:  [] Blood in stool   [] Vomiting  blood  [] Gastroesophageal reflux/heartburn   [] Difficulty swallowing. Genitourinary:  [x] Chronic kidney disease   [] Difficult urination  [] Frequent urination  [] Burning with urination   [] Blood in urine Skin:  [] Rashes   [x] Ulcers   [x] Wounds Psychological:  [] History of anxiety   []  History of major depression.  Physical Examination  Filed Vitals:   06/05/15 2039 06/05/15 2126 06/06/15 0614 06/06/15 1307  BP: 150/69 122/47 147/85 107/49  Pulse: 77 77 68 65  Temp:  99.2 F (37.3 C) 98.3 F (36.8 C) 97.7 F (36.5 C)  TempSrc:  Oral Oral Oral  Resp:  16 16   Height:  4\' 11"  (1.499 m)    Weight:  95.709 kg (211 lb)    SpO2: 99% 98% 100% 97%   Body mass index is 42.59 kg/(m^2). Gen:  WD/WN, NAD Head: [place]/AT, No temporalis wasting. Prominent temp pulse not noted. Ear/Nose/Throat: Hearing grossly intact, nares w/o erythema or drainage, oropharynx w/o Erythema/Exudate Eyes: PERRLA, EOMI.  Neck: Supple, no nuchal rigidity.  No bruit or JVD.  Pulmonary:  Good air movement, clear to auscultation bilaterally.  Cardiac: RRR, normal S1, S2,  Vascular:  Vessel Right Left  Radial Palpable Palpable  Ulnar Palpable Palpable  Brachial Palpable Palpable  Carotid Palpable, without bruit Palpable, without bruit  Aorta Not palpable N/A  Femoral Palpable Palpable  Popliteal Not Palpable Not Palpable  PT Not Palpable Weakly Palpable  DP Not Palpable Not Palpable   Gastrointestinal: soft, non-tender/non-distended. No guarding/reflex. No masses, surgical incisions, or scars. Musculoskeletal: M/S 5/5 throughout.  Right foot has fixed ischemic changes. Left foot has decent capillary refill and is warm. Right arm in a large cast/Ace wrap Neurologic: CN 2-12 intact. Pain and light touch intact  in extremities.  Symmetrical.  Speech is fluent. Motor exam as listed above. Psychiatric: Judgment intact, Mood & affect appropriate for pt's clinical situation. Dermatologic: Right foot with fixed ischemic changes and bulla present in the lower leg and foot area. Lymph : No Cervical, Axillary, or Inguinal lymphadenopathy.      CBC Lab Results  Component Value Date   WBC 11.1* 06/06/2015   HGB 7.2* 06/06/2015   HCT 23.0* 06/06/2015   MCV 90.2 06/06/2015   PLT 374 06/06/2015    BMET    Component Value Date/Time   NA 136 06/06/2015 0417   K 4.4 06/06/2015 0417   CL 96* 06/06/2015 0417   CO2 32 06/06/2015 0417   GLUCOSE 194* 06/06/2015 0417   BUN 45* 06/06/2015 0417   CREATININE 1.95* 06/06/2015 0417   CALCIUM 7.9* 06/06/2015 0417   GFRNONAA 22* 06/06/2015 0417   GFRAA 25* 06/06/2015 0417   Estimated Creatinine Clearance: 20.2 mL/min (by C-G formula based on Cr of 1.95).  COAG Lab Results  Component Value Date   INR 1.24 06/05/2015   INR 1.09 05/26/2015    Radiology Dg Chest 1 View  06/05/2015   CLINICAL DATA:  Right humerus fracture.  Preoperative evaluation.  EXAM: CHEST 1 VIEW  COMPARISON:  05/28/2015.  FINDINGS: Stable enlarged cardiac silhouette and post CABG changes. The pulmonary vasculature and interstitial markings remain prominent. No pleural fluid. Bilateral shoulder degenerative changes with superior migration of the humeral heads, compatible with large, chronic bilateral rotator cuff tears. Cervical spine degenerative changes.  IMPRESSION: Stable cardiomegaly, pulmonary vascular congestion and mild chronic interstitial lung disease.   Electronically Signed   By: Beckie Salts M.D.   On: 06/05/2015 18:44   Dg Chest 1 View  05/18/2015   CLINICAL DATA:  Pelvis morning. CHF. Nonsmoker. History of cancer.  EXAM: CHEST  1 VIEW  COMPARISON:  08/21/2008  FINDINGS: Status post median sternotomy and CABG. Heart is enlarged. There are no focal consolidations. No pleural  effusions. No pulmonary edema. Mildly prominent interstitial markings appear generally stable over prior studies. Chronic changes in both shoulders consistent with chronic rotator cuff injuries.  IMPRESSION: Cardiomegaly.  No edema.   Electronically Signed   By: Norva Pavlov M.D.   On: 05/18/2015 08:03   Ct Head Wo Contrast  05/18/2015   CLINICAL DATA:  Larey Seat this morning in her bathroom while attempting to use restroom. Often gets lightheaded. Uses a walker.  EXAM: CT HEAD WITHOUT CONTRAST  TECHNIQUE: Contiguous axial images were obtained from the base of the skull through the vertex without intravenous contrast.  COMPARISON:  None.  FINDINGS: There is mild periventricular white matter change consistent with small vessel disease. There is no intra or extra-axial fluid collection or mass lesion. The basilar cisterns and ventricles have a normal appearance. There is no CT evidence for acute infarction or hemorrhage. Bone windows show no acute abnormality. There is atherosclerotic calcification of the internal carotid arteries.  IMPRESSION: 1.  No evidence for acute intracranial abnormality. 2. Chronic ischemic white matter changes.   Electronically Signed   By: Norva Pavlov M.D.   On: 05/18/2015 08:06   Dg Chest Port 1 View  05/28/2015   CLINICAL DATA:  Status post PICC line placement.  EXAM: PORTABLE CHEST - 1 VIEW  COMPARISON:  May 18, 2015  FINDINGS: The heart size and mediastinal contours are stable. The heart size is enlarged. Patient is status post prior CABG and median sternotomy. A left subclavian central venous line is identified with distal tip in superior vena cava. There is no pneumothorax. There is mild central pulmonary vascular congestion. The visualized skeletal structures are stable.  IMPRESSION: Left central venous line distal tip in the superior vena cava. There is no pneumothorax.  Mild central pulmonary vascular congestion.   Electronically Signed   By: Sherian Rein M.D.   On:  05/28/2015 17:35   Dg Humerus Right  05/18/2015   CLINICAL DATA:  Larey Seat and landed on the right arm. Pain in the distal and of the humerus. No previous surgeries.  EXAM: RIGHT HUMERUS - 2+ VIEW  COMPARISON:  None.  FINDINGS: There is an oblique comminuted fracture of the distal right humerus proximal to the elbow. There is medial angulation at the fracture site. Significant large osteophytes are identified on the humeral head, associated with high-riding shoulder and loss of the subacromial space. Findings are consistent with chronic rotator cuff injury.  IMPRESSION: Comminuted oblique fracture of the distal humerus associated with angulation of the fracture site.  Chronic rotator cuff injury with significant associated degenerative change.   Electronically Signed   By: Norva Pavlov M.D.   On: 05/18/2015 07:59      Assessment/Plan 1. Right foot and lower leg gangrene. Non-reconstructable vascular disease with severe ischemia. Intractable pain and tissue loss. Patient and family desire to go ahead and proceed with above-knee amputation which at this point would be the safest and most realistic option of improved quality of life. Risks and benefits are discussed. We will proceed this afternoon 2. Hypertension. Stable. Continue outpatient meds 3. Heart disease. Stable. Moderate perioperative risk, but surgery is not elective and must be performed 4. Diabetes. Glucose control important in wound healing postoperatively 5. Anemia. Blood transfusion planned for today. 6. Chronic kidney  disease. Severe.   DEW,JASON, MD  06/06/2015 4:40 PM

## 2015-06-06 NOTE — Anesthesia Procedure Notes (Signed)
Procedure Name: Intubation Performed by: LOGAN, BENJAMIN Pre-anesthesia Checklist: Patient identified, Patient being monitored, Timeout performed, Emergency Drugs available and Suction available Patient Re-evaluated:Patient Re-evaluated prior to inductionOxygen Delivery Method: Circle system utilized Preoxygenation: Pre-oxygenation with 100% oxygen Intubation Type: IV induction Ventilation: Mask ventilation without difficulty Laryngoscope Size: Miller and 2 Grade View: Grade II Tube type: Oral Tube size: 7.0 mm Number of attempts: 1 Airway Equipment and Method: Stylet Placement Confirmation: ETT inserted through vocal cords under direct vision,  positive ETCO2 and breath sounds checked- equal and bilateral Secured at: 21 cm Tube secured with: Tape Dental Injury: Teeth and Oropharynx as per pre-operative assessment      

## 2015-06-06 NOTE — Anesthesia Preprocedure Evaluation (Addendum)
Anesthesia Evaluation  Patient identified by MRN, date of birth, ID band Patient awake    Reviewed: Allergy & Precautions, NPO status , Patient's Chart, lab work & pertinent test results, reviewed documented beta blocker date and time   Airway Mallampati: II  TM Distance: <3 FB Neck ROM: Full    Dental  (+) Chipped, Caps   Pulmonary shortness of breath and with exertion,    breath sounds clear to auscultation + decreased breath sounds      Cardiovascular hypertension, Pt. on medications and Pt. on home beta blockers + CAD, + Peripheral Vascular Disease and +CHF   Rhythm:Irregular Rate:Normal  On Plavix   Neuro/Psych negative neurological ROS  negative psych ROS   GI/Hepatic negative GI ROS, Neg liver ROS,   Endo/Other  diabetes, Well Controlled, Type 2, Oral Hypoglycemic Agents, Insulin Dependent  Renal/GU Renal InsufficiencyRenal disease  negative genitourinary   Musculoskeletal  (+) Arthritis , Osteoarthritis,    Abdominal Normal abdominal exam  (+)   Peds negative pediatric ROS (+)  Hematology negative hematology ROS (+)   Anesthesia Other Findings   Reproductive/Obstetrics                          Anesthesia Physical Anesthesia Plan  ASA: IV  Anesthesia Plan: General   Post-op Pain Management:    Induction: Intravenous  Airway Management Planned: Oral ETT  Additional Equipment:   Intra-op Plan:   Post-operative Plan: Extubation in OR  Informed Consent: I have reviewed the patients History and Physical, chart, labs and discussed the procedure including the risks, benefits and alternatives for the proposed anesthesia with the patient or authorized representative who has indicated his/her understanding and acceptance.   Dental advisory given  Plan Discussed with: CRNA and Surgeon  Anesthesia Plan Comments:         Anesthesia Quick Evaluation

## 2015-06-06 NOTE — Progress Notes (Signed)
Patient given medication prior to scheduled time due to scheduled surgery today

## 2015-06-06 NOTE — Clinical Social Work Note (Signed)
Clinical Social Work Assessment  Patient Details  Name: [name] MRN: [id number] Date of Birth: [birth year]  Date of referral:  06/06/15               Reason for consult:  Facility Placement                Permission sought to share information with:  Family Supports Permission granted to share information::  Yes, Verbal Permission Granted  Name::      (Son: Engineer, manufacturing)  Agency::     Relationship::     Contact Information:     Housing/Transportation Living arrangements for the past 2 months:  [place] of Information:  Patient, Adult Children Patient Interpreter Needed:  None Criminal Activity/Legal Involvement Pertinent to Current Situation/Hospitalization:  No - Comment as needed Significant Relationships:  Adult Children Lives with:  Facility Resident Do you feel safe going back to the place where you live?  Yes Need for family participation in patient care:  No (Coment)  Care giving concerns:  None at this time   Facilities manager / plan:  CSW met with patient and her son: [name]: [phone] (who has provided Power of [place] paperwork and is on patient's chart; although it does not appear to include HPOA). [name] is patient's oldest son and he was with patient this afternoon when CSW spoke with patient. Patient was recently placed at [facility] for STR. Patient has now been readmitted to have an amputation of her right leg. Patient and son both stated that they wish to return to [facility] to complete patient's rehab post surgery.   Employment status:  Retired Nurse, adult PT Recommendations:  Not assessed at this time Information / Referral to community resources:     Patient/Family's Response to care:  Patient and son voiced appreciation for CSW visit.  Patient/Family's Understanding of and Emotional Response to Diagnosis, Current Treatment, and Prognosis:  Patient and son hopeful that patient's pain will  significantly lessen after amputation.   Emotional Assessment Appearance:  Appears stated age Attitude/Demeanor/Rapport:   (pleasant and cooperative) Affect (typically observed):  Accepting, Appropriate Orientation:  Oriented to Self, Oriented to Place, Oriented to Situation Alcohol / Substance use:  Not Applicable Psych involvement (Current and /or in the community):  No (Comment)  Discharge Needs  Concerns to be addressed:  Care Coordination Readmission within the last 30 days:  No Current discharge risk:  None Barriers to Discharge:  No Barriers Identified   Shela Leff, LCSW 06/06/2015, 2:14 PM

## 2015-06-06 NOTE — OR Nursing (Signed)
Patient with patent picc line in upper left arm, not documented in EPIC.  Appears dry and intact with NS in fusing on arrival.

## 2015-06-06 NOTE — Progress Notes (Addendum)
[name] is a 81 y.o. female   SUBJECTIVE:  Patient with severe peripheral vascular disease and ischemic right leg was admitted with significant right leg pain. Last admission vascular obstruction in the right leg not amenable to intervention. No chest pain.  ______________________________________________________________________  ROS: Review of systems is unremarkable for any active cardiac,respiratory, GI, GU, hematologic, neurologic or psychiatric systems, 10 systems reviewed.  Marland Kitchen acetaminophen  650 mg Oral Once  . allopurinol  200 mg Oral Daily  . antiseptic oral rinse  7 mL Mouth Rinse BID  . aspirin EC  81 mg Oral Daily  . atorvastatin  20 mg Oral QHS  . docusate sodium  100 mg Oral BID  . escitalopram  5 mg Oral Daily  . famotidine  20 mg Oral BID  . [START ON 06/07/2015] Influenza vac split quadrivalent PF  0.5 mL Intramuscular Tomorrow-1000  . insulin aspart  0-15 Units Subcutaneous TID WC  . insulin aspart  0-5 Units Subcutaneous QHS  . insulin glargine  30 Units Subcutaneous QHS  . isosorbide mononitrate  60 mg Oral Daily  . loratadine  10 mg Oral Daily  . multivitamin with minerals  1 tablet Oral Daily  . pantoprazole (PROTONIX) IV  40 mg Intravenous Q12H  . [START ON 06/07/2015] pneumococcal 23 valent vaccine  0.5 mL Intramuscular Tomorrow-1000  . [START ON 06/07/2015] vancomycin  1,000 mg Intravenous Q48H   acetaminophen **OR** acetaminophen, ALPRAZolam, HYDROmorphone (DILAUDID) injection, ondansetron **OR** ondansetron (ZOFRAN) IV   Past Medical History  Diagnosis Date  . Cancer   . Blood transfusion without reported diagnosis   . CHF (congestive heart failure)   . Coronary artery disease   . Hypertension   . Shortness of breath dyspnea   . Diabetes mellitus without complication   . Peripheral vascular disease     Past Surgical History  Procedure Laterality Date  . Back surgery    . Coronary artery bypass graft    . Vascular surgery    . Peripheral  vascular catheterization Right 05/27/2015    Procedure: Lower Extremity Angiography;  Surgeon: Renford Dills, MD;  Location: [facility] INVASIVE CV LAB;  Service: Cardiovascular;  Laterality: Right;  . Peripheral vascular catheterization  05/27/2015    Procedure: Lower Extremity Intervention;  Surgeon: Renford Dills, MD;  Location: [facility] INVASIVE CV LAB;  Service: Cardiovascular;;    PHYSICAL EXAM:  BP 147/85 mmHg  Pulse 68  Temp(Src) 98.3 F (36.8 C) (Oral)  Resp 16  Ht  (1.499 m)  Wt 95.709 kg (211 lb)  BMI 42.59 kg/m2  SpO2 100%  Wt Readings from Last 3 Encounters:  06/05/15 95.709 kg (211 lb)  05/30/15 96.435 kg (212 lb 9.6 oz)  05/18/15 92.534 kg (204 lb)           BP Readings from Last 3 Encounters:  06/06/15 147/85  05/31/15 118/53  05/18/15 156/56    Constitutional: NAD Neck: supple, no thyromegaly Respiratory: CTA, no rales or wheezes Cardiovascular: RRR, 2/6 murmur, no gallop Abdomen: soft, good BS, nontender Extremities: no edema, cold right foot, no pulses Neuro: alert with slight confusion, no focal motor or sensory deficits  ASSESSMENT/PLAN:  Labs and imaging studies were reviewed  Ischemic right leg-retry intravascular intervention versus amputation, nothing by mouth for surgical evaluation and treatment, no sign for acute coronary ischemia, troponins due to demand ischemia Diabetes mellitus, insulin requiring-D5 half-normal with insulin coverage Moderate aortic stenosis-avoid hypotension Anemia-acute blood loss, transfusion 1 unit PRBC, hemoglobin 7.2 Chronic systolic  congestive heart failure-follow closely, likely will need IV diuresis over the weekend Chronic renal dysfunction-creatinine improved from 3.0-1.9 Right humeral fracture-pain control Moderate surgical risk

## 2015-06-06 NOTE — Progress Notes (Signed)
Pt admitted to [facility] from the ED. VSS. Alert and oriented. No n/v noted.  C/o pain in right leg.  Right leg currently has no pulse even when checked with doppler. Cool to touch.  Right arm remains in ace wrap.  Dilaudid given x1 with positive results.  Will cont to monitor.

## 2015-06-06 NOTE — Op Note (Signed)
  [facility]   OPERATIVE NOTE   PROCEDURE: Right above-the-knee amputation  PRE-OPERATIVE DIAGNOSIS: Right leg and foot gangrene  POST-OPERATIVE DIAGNOSIS: same as above  SURGEON:  Festus Barren, MD  ASSISTANT(S): none  ANESTHESIA: general  ESTIMATED BLOOD LOSS:100 cc  FINDING(S): None  SPECIMEN(S):  left above-the-knee amputation  INDICATIONS:   [name] is a 81 y.o. female who presents with right lower leg and foot gangrene and non-reconstructable vascular disease.  The patient is scheduled for a right above-the-knee amputation.  I discussed in depth with the patient the risks, benefits, and alternatives to this procedure.  The patient is aware that the risk of this operation included but are not limited to:  bleeding, infection, myocardial infarction, stroke, death, failure to heal amputation wound, and possible need for more proximal amputation.  The patient is aware of the risks and agrees proceed forward with the procedure.  DESCRIPTION: After full informed written consent was obtained from the patient, the patient was taken to the operating room, and placed supine upon the operating table.  Prior to induction, the patient received IV antibiotics.  The patient was then prepped and draped in the standard fashion for an above-the-knee amputation.  After obtaining adequate anesthesia, the patient was prepped and draped in the standard fashion for a above-the-knee amputation.  I marked out the anterior and posterior flaps for a fish-mouth type of amputation.  I made the incisions for these flaps, and then dissected through the subcutaneous tissue, fascia, and muscles circumferentially.  I elevated  the periosteal tissue 4-5 cm more proximal than the anterior skin flap.  I then transected the femur with a power saw at this level.  Then I smoothed out the rough edges of the bone.  At this point, the specimen was passed off the field as the above-the-knee  amputation.  At this point, I clamped all visibly bleeding arteries and veins using a combination of suture ligation with silk suture and electrocautery.   Bleeding continued to be controlled with electrocautery and suture ligature.  The stump was washed off with sterile normal saline and no further active bleeding was noted. I used 2 large 0 Vicryl figure-of-eight sutures to close the deep space over the bone. I reapproximated the anterior and posterior fascia  with interrupted stitches of 0 Vicryl.  This was completed along the entire length of anterior and posterior fascia until there were no more loose space in the fascial line. The subcutaneous tissue was then approximated with 2-0 vicryl sutures. The skin was then  reapproximated with staples.  The stump was washed off and dried.  The incision was dressed with Xeroform and ABD pads, and  then fluffs were applied.  Kerlix was wrapped around the leg and then gently an ACE wrap was applied.  A large Ioban was then placed over the ACE wrap to secure the dressing. The patient was then awakened and take to the recovery room in stable condition.   COMPLICATIONS: None  CONDITION: Stable  DEW,JASON  06/06/2015, 6:45 PM

## 2015-06-06 NOTE — Progress Notes (Signed)
Dr. Hyacinth Meeker made aware of trop level 0.07.  Also asked to see if he would like pt to be on any IV fluids continuous since pt will be NPO after MN.  No new orders noted at this time. MD will reevaluate in AM if pt needs IV fluids.

## 2015-06-07 DIAGNOSIS — E1152 Type 2 diabetes mellitus with diabetic peripheral angiopathy with gangrene: Secondary | ICD-10-CM | POA: Diagnosis not present

## 2015-06-07 LAB — CBC WITH DIFFERENTIAL/PLATELET
BASOS ABS: 0 10*3/uL (ref 0–0.1)
BASOS PCT: 0 %
EOS ABS: 0 10*3/uL (ref 0–0.7)
Eosinophils Relative: 0 %
HCT: 24.4 % — ABNORMAL LOW (ref 35.0–47.0)
HEMOGLOBIN: 8 g/dL — AB (ref 12.0–16.0)
Lymphocytes Relative: 5 %
Lymphs Abs: 0.5 10*3/uL — ABNORMAL LOW (ref 1.0–3.6)
MCH: 29.5 pg (ref 26.0–34.0)
MCHC: 32.8 g/dL (ref 32.0–36.0)
MCV: 90 fL (ref 80.0–100.0)
Monocytes Absolute: 0.3 10*3/uL (ref 0.2–0.9)
Monocytes Relative: 3 %
NEUTROS PCT: 92 %
Neutro Abs: 10.2 10*3/uL — ABNORMAL HIGH (ref 1.4–6.5)
Platelets: 367 10*3/uL (ref 150–440)
RBC: 2.71 MIL/uL — AB (ref 3.80–5.20)
RDW: 16.2 % — ABNORMAL HIGH (ref 11.5–14.5)
WBC: 11 10*3/uL (ref 3.6–11.0)

## 2015-06-07 LAB — COMPREHENSIVE METABOLIC PANEL
ALBUMIN: 1.9 g/dL — AB (ref 3.5–5.0)
ALK PHOS: 94 U/L (ref 38–126)
ALT: 33 U/L (ref 14–54)
AST: 30 U/L (ref 15–41)
Anion gap: 11 (ref 5–15)
BUN: 55 mg/dL — ABNORMAL HIGH (ref 6–20)
CALCIUM: 7.8 mg/dL — AB (ref 8.9–10.3)
CHLORIDE: 98 mmol/L — AB (ref 101–111)
CO2: 29 mmol/L (ref 22–32)
CREATININE: 2.02 mg/dL — AB (ref 0.44–1.00)
GFR calc Af Amer: 24 mL/min — ABNORMAL LOW (ref >60)
GFR calc non Af Amer: 21 mL/min — ABNORMAL LOW (ref >60)
GLUCOSE: 309 mg/dL — AB (ref 65–99)
Potassium: 5.1 mmol/L (ref 3.5–5.1)
SODIUM: 138 mmol/L (ref 135–145)
Total Bilirubin: 1.1 mg/dL (ref 0.3–1.2)
Total Protein: 5.7 g/dL — ABNORMAL LOW (ref 6.5–8.1)

## 2015-06-07 LAB — GLUCOSE, CAPILLARY
GLUCOSE-CAPILLARY: 298 mg/dL — AB (ref 65–99)
GLUCOSE-CAPILLARY: 332 mg/dL — AB (ref 65–99)
Glucose-Capillary: 348 mg/dL — ABNORMAL HIGH (ref 65–99)
Glucose-Capillary: 365 mg/dL — ABNORMAL HIGH (ref 65–99)

## 2015-06-07 NOTE — Progress Notes (Signed)
1 Day Post-Op S/P Right BKA Subjective: Doing OK. Pain minimal and now well controlled with current regimen. Pe rfamily she is overall much better.  Objective: Vital signs in last 24 hours: Temp:  [97.4 F (36.3 C)-98.9 F (37.2 C)] 97.8 F (36.6 C) (09/24 9147) Pulse Rate:  [36-87] 63 (09/24 0633) Resp:  [16-25] 18 (09/24 8295) BP: (107-145)/(48-77) 112/52 mmHg (09/24 0633) SpO2:  [96 %-100 %] 100 % (09/24 0633) Last BM Date: 06/02/15  Intake/Output from previous day: 09/23 0701 - 09/24 0700 In: 1350 [P.O.:240; I.V.:780; Blood:330] Out: 350 [Urine:350]     Gen: Alert, NAD Right Stump: dressing , C/D/I  Lab Results:   Recent Labs  06/06/15 0417 06/07/15 0600  WBC 11.1* 11.0  HGB 7.2* 8.0*  HCT 23.0* 24.4*  PLT 374 367   BMET  Recent Labs  06/06/15 0417 06/07/15 0600  NA 136 138  K 4.4 5.1  CL 96* 98*  CO2 32 29  GLUCOSE 194* 309*  BUN 45* 55*  CREATININE 1.95* 2.02*  CALCIUM 7.9* 7.8*   PT/INR  Recent Labs  06/05/15 1816  LABPROT 15.8*  INR 1.24     Assessment/Plan: s/p Procedure(s): AMPUTATION ABOVE KNEE (Right) POD #1  Continue pain control DVT prophylaxis OOB with PT as tolerated Ortho consult for right arm cast  [name], [name] 06/07/2015

## 2015-06-07 NOTE — Progress Notes (Signed)
[name] is a 81 y.o. female patient. 1. Cellulitis of right lower extremity   2. Necrotic toes   3. S/P PICC central line placement    Past Medical History  Diagnosis Date  . Cancer   . Blood transfusion without reported diagnosis   . CHF (congestive heart failure)   . Coronary artery disease   . Hypertension   . Shortness of breath dyspnea   . Diabetes mellitus without complication   . Peripheral vascular disease    Current Facility-Administered Medications  Medication Dose Route Frequency Provider Last Rate Last Dose  . acetaminophen (TYLENOL) tablet 650 mg  650 mg Oral Q6H PRN Wyatt Haste, MD       Or  . acetaminophen (TYLENOL) suppository 650 mg  650 mg Rectal Q6H PRN Wyatt Haste, MD      . allopurinol (ZYLOPRIM) tablet 200 mg  200 mg Oral Daily Wyatt Haste, MD   200 mg at 06/07/15 0736  . ALPRAZolam Prudy Feeler) tablet 0.25 mg  0.25 mg Oral TID PRN Wyatt Haste, MD   0.25 mg at 06/07/15 0735  . antiseptic oral rinse (CPC / CETYLPYRIDINIUM CHLORIDE 0.05%) solution 7 mL  7 mL Mouth Rinse BID Danella Penton, MD   7 mL at 06/06/15 2049  . aspirin EC tablet 81 mg  81 mg Oral Daily Wyatt Haste, MD   81 mg at 06/07/15 0736  . atorvastatin (LIPITOR) tablet 20 mg  20 mg Oral QHS Wyatt Haste, MD   20 mg at 06/06/15 2049  . dextrose 5 %-0.45 % sodium chloride infusion   Intravenous Continuous Danella Penton, MD 50 mL/hr at 06/07/15 0125    . docusate sodium (COLACE) capsule 100 mg  100 mg Oral BID Wyatt Haste, MD   100 mg at 06/07/15 0737  . escitalopram (LEXAPRO) tablet 5 mg  5 mg Oral Daily Wyatt Haste, MD      . HYDROcodone-acetaminophen (NORCO/VICODIN) 5-325 MG per tablet 1-2 tablet  1-2 tablet Oral Q6H PRN Bertram Denver, MD   1 tablet at 06/07/15 0359  . HYDROmorphone (DILAUDID) injection 0.5 mg  0.5 mg Intravenous Q2H PRN Annice Needy, MD   0.5 mg at 06/06/15 2002  . Influenza vac split quadrivalent PF (FLUARIX) injection 0.5 mL  0.5 mL Intramuscular Tomorrow-1000 Danella Penton, MD      . insulin aspart (novoLOG) injection 0-15 Units  0-15 Units Subcutaneous TID WC Wyatt Haste, MD   8 Units at 06/07/15 0908  . insulin aspart (novoLOG) injection 0-5 Units  0-5 Units Subcutaneous QHS Wyatt Haste, MD   0 Units at 06/05/15 2241  . insulin glargine (LANTUS) injection 30 Units  30 Units Subcutaneous QHS Wyatt Haste, MD   30 Units at 06/07/15 0003  . isosorbide mononitrate (IMDUR) 24 hr tablet 60 mg  60 mg Oral Daily Wyatt Haste, MD   60 mg at 06/07/15 0735  . loratadine (CLARITIN) tablet 10 mg  10 mg Oral Daily Wyatt Haste, MD   10 mg at 06/07/15 0737  . multivitamin with minerals tablet 1 tablet  1 tablet Oral Daily Wyatt Haste, MD   1 tablet at 06/07/15 0734  . ondansetron (ZOFRAN) tablet 4 mg  4 mg Oral Q6H PRN Wyatt Haste, MD       Or  . ondansetron [facility]) injection 4 mg  4 mg Intravenous Q6H PRN Wyatt Haste, MD      .  ondansetron (ZOFRAN) injection 4 mg  4 mg Intravenous Once PRN Yves Dill, MD      . pantoprazole (PROTONIX) injection 40 mg  40 mg Intravenous Q12H Danella Penton, MD   40 mg at 06/07/15 0738  . pneumococcal 23 valent vaccine (PNU-IMMUNE) injection 0.5 mL  0.5 mL Intramuscular Tomorrow-1000 Danella Penton, MD      . vancomycin (VANCOCIN) IVPB 1000 mg/200 mL premix  1,000 mg Intravenous Q48H Myrna Blazer, MD   1,000 mg at 06/07/15 1610   Allergies  Allergen Reactions  . Dilaudid [Hydromorphone Hcl] Nausea And Vomiting  . Morphine And Related Hives and Other (See Comments)    Reaction:  Lightheadedness    Principal Problem:   Ischemic leg  Blood pressure 112/52, pulse 63, temperature 97.8 F (36.6 C), temperature source Oral, resp. rate 18, height  (1.499 m), weight 95.709 kg (211 lb), SpO2 100 %.  Subjective  Mildly confused, denies cp, some st changes on moniter, aggressive pain control being attempted post bka.  No nvd no fcs no cp or sob  Objective  nad Alert and pleasant Chest; Clear  Heart;  Rrr Abd; Nt, nl bs's Ext no cct Assessment & Plan Ischemic right leg-retry intravascular intervention versus amputation, nothing by mouth for surgical evaluation and treatment, no sign for acute coronary ischemia, troponins due to demand ischemia Diabetes mellitus, insulin requiring-D5 half-normal with insulin coverage Moderate aortic stenosis-avoid hypotension Anemia-acute blood loss, transfusion 1 unit PRBC, hemoglobin 7.2 Chronic systolic congestive heart failure-follow closely, likely will need IV diuresis over the weekend Chronic renal dysfunction-creatinine improved from 3.0-1.9 Right humeral fracture-pain control Moderate surgical risk Signed Lauro Regulus. 06/07/2015

## 2015-06-08 LAB — BASIC METABOLIC PANEL
Anion gap: 8 (ref 5–15)
BUN: 58 mg/dL — AB (ref 6–20)
CALCIUM: 7.4 mg/dL — AB (ref 8.9–10.3)
CO2: 31 mmol/L (ref 22–32)
CREATININE: 2 mg/dL — AB (ref 0.44–1.00)
Chloride: 93 mmol/L — ABNORMAL LOW (ref 101–111)
GFR calc Af Amer: 24 mL/min — ABNORMAL LOW (ref >60)
GFR, EST NON AFRICAN AMERICAN: 21 mL/min — AB (ref >60)
GLUCOSE: 345 mg/dL — AB (ref 65–99)
POTASSIUM: 4.3 mmol/L (ref 3.5–5.1)
Sodium: 132 mmol/L — ABNORMAL LOW (ref 135–145)

## 2015-06-08 LAB — GLUCOSE, CAPILLARY
GLUCOSE-CAPILLARY: 200 mg/dL — AB (ref 65–99)
Glucose-Capillary: 317 mg/dL — ABNORMAL HIGH (ref 65–99)
Glucose-Capillary: 205 mg/dL — ABNORMAL HIGH (ref 65–99)
Glucose-Capillary: 185 mg/dL — ABNORMAL HIGH (ref 65–99)

## 2015-06-08 LAB — CBC WITH DIFFERENTIAL/PLATELET
Basophils Absolute: 0 10*3/uL (ref 0–0.1)
Basophils Relative: 0 %
EOS PCT: 0 %
Eosinophils Absolute: 0 10*3/uL (ref 0–0.7)
HCT: 21.8 % — ABNORMAL LOW (ref 35.0–47.0)
Hemoglobin: 7.1 g/dL — ABNORMAL LOW (ref 12.0–16.0)
LYMPHS ABS: 0.8 10*3/uL — AB (ref 1.0–3.6)
LYMPHS PCT: 8 %
MCH: 29 pg (ref 26.0–34.0)
MCHC: 32.7 g/dL (ref 32.0–36.0)
MCV: 88.7 fL (ref 80.0–100.0)
MONO ABS: 0.7 10*3/uL (ref 0.2–0.9)
MONOS PCT: 7 %
Neutro Abs: 8.6 10*3/uL — ABNORMAL HIGH (ref 1.4–6.5)
Neutrophils Relative %: 85 %
PLATELETS: 375 10*3/uL (ref 150–440)
RBC: 2.46 MIL/uL — ABNORMAL LOW (ref 3.80–5.20)
RDW: 16.2 % — AB (ref 11.5–14.5)
WBC: 10.2 10*3/uL (ref 3.6–11.0)

## 2015-06-08 MED ORDER — PANTOPRAZOLE SODIUM 40 MG PO TBEC
40.0000 mg | DELAYED_RELEASE_TABLET | Freq: Two times a day (BID) | ORAL | Status: DC
  Administered 2015-06-08 – 2015-06-11 (×7): 40 mg via ORAL
  Filled 2015-06-08 (×7): qty 1

## 2015-06-08 NOTE — Progress Notes (Signed)
2 Days Post-Op  Subjective: Doing much better today. Pain controlled.  Objective: Vital signs in last 24 hours: Temp:  [97.6 F (36.4 C)-97.9 F (36.6 C)] 97.6 F (36.4 C) (09/25 0514) Pulse Rate:  [47-94] 94 (09/25 0514) Resp:  [20] 20 (09/25 0514) BP: (85-123)/(35-53) 123/53 mmHg (09/25 0514) SpO2:  [98 %-100 %] 98 % (09/25 0514) Last BM Date: 06/02/15  Intake/Output from previous day: 09/24 0701 - 09/25 0700 In: 1759 [P.O.:420; I.V.:1140; IV Piggyback:199] Out: 450 [Urine:450] Intake/Output this shift: Total I/O In: 398.3 [P.O.:240; I.V.:158.3] Out: 350 [Urine:350]  Gen: Alert, NAD Right Stump: dressing, C/D/I  Lab Results:   Recent Labs  06/07/15 0600 06/08/15 0550  WBC 11.0 10.2  HGB 8.0* 7.1*  HCT 24.4* 21.8*  PLT 367 375   BMET  Recent Labs  06/07/15 0600 06/08/15 0550  NA 138 132*  K 5.1 4.3  CL 98* 93*  CO2 29 31  GLUCOSE 309* 345*  BUN 55* 58*  CREATININE 2.02* 2.00*  CALCIUM 7.8* 7.4*   PT/INR  Recent Labs  06/05/15 1816  LABPROT 15.8*  INR 1.24      Anti-infectives: Anti-infectives    Start     Dose/Rate Route Frequency Ordered Stop   06/07/15 0630  vancomycin (VANCOCIN) IVPB 1000 mg/200 mL premix     1,000 mg 200 mL/hr over 60 Minutes Intravenous Every 48 hours 06/05/15 1949     06/05/15 1830  vancomycin (VANCOCIN) IVPB 1000 mg/200 mL premix     1,000 mg 200 mL/hr over 60 Minutes Intravenous  Once 06/05/15 1827 06/05/15 2020      Assessment/Plan: s/p Procedure(s): AMPUTATION ABOVE KNEE (Right)  POD#2  Doing OK.  HgB 7- monitoring for now, no evidence of bleeding at stump Pain control OOB with PT  Will change dressing tomorrow  LOS: 3 days    Eli Hose A 06/08/2015

## 2015-06-08 NOTE — Progress Notes (Signed)
[name] is a 81 y.o. female patient. 1. Cellulitis of right lower extremity   2. Necrotic toes   3. S/P PICC central line placement    Past Medical History  Diagnosis Date  . Cancer   . Blood transfusion without reported diagnosis   . CHF (congestive heart failure)   . Coronary artery disease   . Hypertension   . Shortness of breath dyspnea   . Diabetes mellitus without complication   . Peripheral vascular disease    Current Facility-Administered Medications  Medication Dose Route Frequency Provider Last Rate Last Dose  . acetaminophen (TYLENOL) tablet 650 mg  650 mg Oral Q6H PRN Wyatt Haste, MD       Or  . acetaminophen (TYLENOL) suppository 650 mg  650 mg Rectal Q6H PRN Wyatt Haste, MD      . allopurinol (ZYLOPRIM) tablet 200 mg  200 mg Oral Daily Wyatt Haste, MD   200 mg at 06/08/15 1006  . ALPRAZolam Prudy Feeler) tablet 0.25 mg  0.25 mg Oral TID PRN Wyatt Haste, MD   0.25 mg at 06/07/15 0735  . antiseptic oral rinse (CPC / CETYLPYRIDINIUM CHLORIDE 0.05%) solution 7 mL  7 mL Mouth Rinse BID Danella Penton, MD   7 mL at 06/06/15 2049  . aspirin EC tablet 81 mg  81 mg Oral Daily Wyatt Haste, MD   81 mg at 06/08/15 1006  . atorvastatin (LIPITOR) tablet 20 mg  20 mg Oral QHS Wyatt Haste, MD   20 mg at 06/07/15 2222  . dextrose 5 %-0.45 % sodium chloride infusion   Intravenous Continuous Danella Penton, MD 50 mL/hr at 06/07/15 2223    . docusate sodium (COLACE) capsule 100 mg  100 mg Oral BID Wyatt Haste, MD   100 mg at 06/08/15 1006  . escitalopram (LEXAPRO) tablet 5 mg  5 mg Oral Daily Wyatt Haste, MD   5 mg at 06/08/15 1006  . HYDROcodone-acetaminophen (NORCO/VICODIN) 5-325 MG per tablet 1-2 tablet  1-2 tablet Oral Q6H PRN Bertram Denver, MD   1 tablet at 06/08/15 0600  . HYDROmorphone (DILAUDID) injection 0.5 mg  0.5 mg Intravenous Q2H PRN Annice Needy, MD   0.5 mg at 06/07/15 1057  . insulin aspart (novoLOG) injection 0-15 Units  0-15 Units Subcutaneous TID WC Wyatt Haste, MD   11 Units at 06/08/15 0804  . insulin aspart (novoLOG) injection 0-5 Units  0-5 Units Subcutaneous QHS Wyatt Haste, MD   5 Units at 06/07/15 2223  . insulin glargine (LANTUS) injection 30 Units  30 Units Subcutaneous QHS Wyatt Haste, MD   30 Units at 06/07/15 2222  . isosorbide mononitrate (IMDUR) 24 hr tablet 60 mg  60 mg Oral Daily Wyatt Haste, MD   60 mg at 06/08/15 1006  . loratadine (CLARITIN) tablet 10 mg  10 mg Oral Daily Wyatt Haste, MD   10 mg at 06/08/15 1006  . multivitamin with minerals tablet 1 tablet  1 tablet Oral Daily Wyatt Haste, MD   1 tablet at 06/08/15 1006  . ondansetron (ZOFRAN) tablet 4 mg  4 mg Oral Q6H PRN Wyatt Haste, MD       Or  . ondansetron [facility]) injection 4 mg  4 mg Intravenous Q6H PRN Wyatt Haste, MD      . ondansetron [facility]) injection 4 mg  4 mg Intravenous Once PRN Yves Dill, MD      .  pantoprazole (PROTONIX) EC tablet 40 mg  40 mg Oral BID Danella Penton, MD   40 mg at 06/08/15 1006  . vancomycin (VANCOCIN) IVPB 1000 mg/200 mL premix  1,000 mg Intravenous Q48H Myrna Blazer, MD   1,000 mg at 06/07/15 1610   Allergies  Allergen Reactions  . Dilaudid [Hydromorphone Hcl] Nausea And Vomiting  . Morphine And Related Hives and Other (See Comments)    Reaction:  Lightheadedness    Principal Problem:   Ischemic leg  Blood pressure 123/53, pulse 94, temperature 97.6 F (36.4 C), temperature source Oral, resp. rate 20, height  (1.499 m), weight 95.709 kg (211 lb), SpO2 98 %.  Subjective  Mildly confused but pleasant, denies cp, some st changes on moniter, aggressive pain control being attempted post bka. Attempting pt  No nvd no fcs no cp or sob  Objective  nad Alert and pleasant Chest; Clear Heart; Rrr Abd; Nt, nl bs's Ext no cce, post bka Assessment & Plan  SignedIschemic right leg- post bka with good pain control Diabetes mellitus, insulin requiring-D5 half-normal with insulin coverage Moderate aortic  stenosis-noted, stable Anemia-acute blood loss, following above 7 now without symptomatic ischemia Chronic systolic congestive heart failure-follow closely  Chronic renal dysfunction-creatinine improved and stable Right humeral fracture-pain control, will need ortho follow up with Dr Joice Lofts per family Lauro Regulus. 06/08/2015

## 2015-06-08 NOTE — Progress Notes (Signed)
IV to PO:    81 yo female ordered Protonix  IV Q12H.  Patient is eating and taking other meds by mouth.  Will transition to Protonix  PO BID as per policy.   Stormy Card, [facility] 06/08/2015

## 2015-06-09 ENCOUNTER — Encounter: Payer: Self-pay | Admitting: Vascular Surgery

## 2015-06-09 ENCOUNTER — Inpatient Hospital Stay: Payer: Medicare Other

## 2015-06-09 LAB — CBC WITH DIFFERENTIAL/PLATELET
BASOS ABS: 0 10*3/uL (ref 0–0.1)
BASOS PCT: 0 %
EOS ABS: 0 10*3/uL (ref 0–0.7)
Eosinophils Relative: 1 %
HCT: 23.2 % — ABNORMAL LOW (ref 35.0–47.0)
Hemoglobin: 7.4 g/dL — ABNORMAL LOW (ref 12.0–16.0)
Lymphocytes Relative: 16 %
Lymphs Abs: 1.2 10*3/uL (ref 1.0–3.6)
MCH: 28.7 pg (ref 26.0–34.0)
MCHC: 31.8 g/dL — AB (ref 32.0–36.0)
MCV: 90.1 fL (ref 80.0–100.0)
MONO ABS: 0.6 10*3/uL (ref 0.2–0.9)
MONOS PCT: 8 %
NEUTROS ABS: 5.2 10*3/uL (ref 1.4–6.5)
Neutrophils Relative %: 75 %
PLATELETS: 334 10*3/uL (ref 150–440)
RBC: 2.58 MIL/uL — ABNORMAL LOW (ref 3.80–5.20)
RDW: 16.3 % — AB (ref 11.5–14.5)
WBC: 7 10*3/uL (ref 3.6–11.0)

## 2015-06-09 LAB — GLUCOSE, CAPILLARY
GLUCOSE-CAPILLARY: 122 mg/dL — AB (ref 65–99)
GLUCOSE-CAPILLARY: 247 mg/dL — AB (ref 65–99)
Glucose-Capillary: 221 mg/dL — ABNORMAL HIGH (ref 65–99)
Glucose-Capillary: 142 mg/dL — ABNORMAL HIGH (ref 65–99)
Glucose-Capillary: 170 mg/dL — ABNORMAL HIGH (ref 65–99)

## 2015-06-09 LAB — BASIC METABOLIC PANEL
ANION GAP: 5 (ref 5–15)
BUN: 43 mg/dL — ABNORMAL HIGH (ref 6–20)
CALCIUM: 7.7 mg/dL — AB (ref 8.9–10.3)
CO2: 34 mmol/L — AB (ref 22–32)
CREATININE: 1.36 mg/dL — AB (ref 0.44–1.00)
Chloride: 99 mmol/L — ABNORMAL LOW (ref 101–111)
GFR, EST AFRICAN AMERICAN: 39 mL/min — AB (ref >60)
GFR, EST NON AFRICAN AMERICAN: 34 mL/min — AB (ref >60)
Glucose, Bld: 264 mg/dL — ABNORMAL HIGH (ref 65–99)
Potassium: 4.2 mmol/L (ref 3.5–5.1)
SODIUM: 138 mmol/L (ref 135–145)

## 2015-06-09 LAB — VANCOMYCIN, TROUGH: VANCOMYCIN TR: 35 ug/mL — AB (ref 10–20)

## 2015-06-09 MED ORDER — FUROSEMIDE 10 MG/ML IJ SOLN
40.0000 mg | Freq: Once | INTRAMUSCULAR | Status: AC
  Administered 2015-06-09: 40 mg via INTRAVENOUS
  Filled 2015-06-09: qty 4

## 2015-06-09 MED ORDER — POTASSIUM CHLORIDE CRYS ER 10 MEQ PO TBCR
10.0000 meq | EXTENDED_RELEASE_TABLET | Freq: Two times a day (BID) | ORAL | Status: DC
  Administered 2015-06-09 (×2): 10 meq via ORAL
  Filled 2015-06-09 (×2): qty 1

## 2015-06-09 MED ORDER — TEMAZEPAM 15 MG PO CAPS
15.0000 mg | ORAL_CAPSULE | Freq: Every day | ORAL | Status: DC
  Administered 2015-06-09 – 2015-06-10 (×2): 15 mg via ORAL
  Filled 2015-06-09 (×2): qty 1

## 2015-06-09 MED ORDER — INSULIN DETEMIR 100 UNIT/ML ~~LOC~~ SOLN
42.0000 [IU] | Freq: Every day | SUBCUTANEOUS | Status: DC
  Administered 2015-06-10: 42 [IU] via SUBCUTANEOUS
  Filled 2015-06-09 (×3): qty 0.42

## 2015-06-09 NOTE — Care Management Important Message (Signed)
Important Message  Patient Details  Name: [name] MRN: [id number] Date of Birth: 11-Aug-1925   Medicare Important Message Given:  Yes-second notification given    [name] 06/09/2015, 11:10 AM

## 2015-06-09 NOTE — Progress Notes (Signed)
Inpatient Diabetes Program Recommendations  AACE/ADA: New Consensus Statement on Inpatient Glycemic Control (2015)  Target Ranges:  Prepandial:   less than 140 mg/dL      Peak postprandial:   less than 180 mg/dL (1-2 hours)      Critically ill patients:  140 - 180 mg/dL   Review of Glycemic Control:  Results for CHELSAE, ZANELLA (MRN [id number]) as of 06/09/2015 12:51  Ref. Range 06/08/2015 11:38 06/08/2015 16:30 06/08/2015 22:26 06/09/2015 07:29 06/09/2015 11:20  Glucose-Capillary Latest Ref Range: 65-99 mg/dL 914 (H) 782 (H) 956 (H) 247 (H) 221 (H)   Diabetes history: Diabetes mellitus Outpatient Diabetes medications: Levemir 60 units q HS, Novolog 4-17 units 4 times a day Current orders for Inpatient glycemic control:  Lantus 30 units q HS, Novolog moderate tid with meals and HS Inpatient Diabetes Program Recommendations:     Consider switching basal insulin to Levemir since this is what patient took prior to admission.  Also please consider increasing Levemir to 42 units q HS (note that home dose was Levemir 60 units q HS).  Thanks, Beryl Meager, RN, BC-ADM Inpatient Diabetes Coordinator Pager [phone] (8a-5p)

## 2015-06-09 NOTE — Progress Notes (Signed)
Patient ID: [name], female   DOB: [birth year], 81 y.o.   MRN: [id number] [name] is a 81 y.o. female   SUBJECTIVE:  Patient with severe peripheral vascular disease and ischemic right leg was admitted with significant right leg pain. Last admission vascular obstruction in the right leg not amenable to intervention. Patient postop right AKA. Complains of dyspnea, insomnia ______________________________________________________________________  ROS: Review of systems is unremarkable for any active cardiac,respiratory, GI, GU, hematologic, neurologic or psychiatric systems, 10 systems reviewed.  Marland Kitchen allopurinol  200 mg Oral Daily  . antiseptic oral rinse  7 mL Mouth Rinse BID  . aspirin EC  81 mg Oral Daily  . atorvastatin  20 mg Oral QHS  . docusate sodium  100 mg Oral BID  . escitalopram  5 mg Oral Daily  . furosemide  40 mg Intravenous Once  . insulin aspart  0-15 Units Subcutaneous TID WC  . insulin aspart  0-5 Units Subcutaneous QHS  . insulin glargine  30 Units Subcutaneous QHS  . isosorbide mononitrate  60 mg Oral Daily  . loratadine  10 mg Oral Daily  . multivitamin with minerals  1 tablet Oral Daily  . pantoprazole  40 mg Oral BID  . potassium chloride  10 mEq Oral BID  . temazepam  15 mg Oral QHS  . vancomycin  1,000 mg Intravenous Q48H   acetaminophen **OR** acetaminophen, ALPRAZolam, HYDROcodone-acetaminophen, HYDROmorphone (DILAUDID) injection, ondansetron **OR** ondansetron (ZOFRAN) IV, ondansetron (ZOFRAN) IV   Past Medical History  Diagnosis Date  . Cancer   . Blood transfusion without reported diagnosis   . CHF (congestive heart failure)   . Coronary artery disease   . Hypertension   . Shortness of breath dyspnea   . Diabetes mellitus without complication   . Peripheral vascular disease     Past Surgical History  Procedure Laterality Date  . Back surgery    . Coronary artery bypass graft    . Vascular surgery    . Peripheral vascular  catheterization Right 05/27/2015    Procedure: Lower Extremity Angiography;  Surgeon: Renford Dills, MD;  Location: [facility] INVASIVE CV LAB;  Service: Cardiovascular;  Laterality: Right;  . Peripheral vascular catheterization  05/27/2015    Procedure: Lower Extremity Intervention;  Surgeon: Renford Dills, MD;  Location: [facility] INVASIVE CV LAB;  Service: Cardiovascular;;    PHYSICAL EXAM:  BP 156/60 mmHg  Pulse 91  Temp(Src) 98 F (36.7 C) (Oral)  Resp 20  Ht  (1.499 m)  Wt 95.709 kg (211 lb)  BMI 42.59 kg/m2  SpO2 100%  Wt Readings from Last 3 Encounters:  06/05/15 95.709 kg (211 lb)  05/30/15 96.435 kg (212 lb 9.6 oz)  05/18/15 92.534 kg (204 lb)           BP Readings from Last 3 Encounters:  06/09/15 156/60  05/31/15 118/53  05/18/15 156/56    Constitutional: NAD Neck: supple, no thyromegaly Respiratory: Mild basilar rales, mild wheezing Cardiovascular: RRR, 2/6 murmur, no gallop Abdomen: soft, good BS, nontender Extremities: no edema, cold right foot, no pulses Neuro: alert with slight confusion, no focal motor or sensory deficits  ASSESSMENT/PLAN:  Labs and imaging studies were reviewed  Ischemic right leg -postop AKA  Diabetes mellitus, insulin requiring-D5 half-normal with insulin coverage Moderate aortic stenosis-avoid hypotension Anemic - hemoglobin 7.1 yesterday, pending today, may need transfusionic systolic congestive heart failure - worsened today, IV Lasix with potassium Chronic renal dysfunction - follow closely  humeral fracture-pain control, ortho  follow up this week skilled nursing later this week

## 2015-06-09 NOTE — Progress Notes (Signed)
ANTIBIOTIC CONSULT NOTE - Follow up  Pharmacy Consult for Vancomycin Indication: Cellulitis  Allergies  Allergen Reactions  . Dilaudid [Hydromorphone Hcl] Nausea And Vomiting  . Morphine And Related Hives and Other (See Comments)    Reaction:  Lightheadedness     Patient Measurements: Height:  (149.9 cm) Weight: 211 lb (95.709 kg) IBW/kg (Calculated) : 43.2 Adjusted Body Weight: 65.6 kg  Vital Signs: Temp: 98 F (36.7 C) (09/26 0620) Temp Source: Oral (09/25 2203) BP: 156/60 mmHg (09/26 0620) Pulse Rate: 91 (09/26 0620) Intake/Output from previous day: 09/25 0701 - 09/26 0700 In: 1728.3 [P.O.:480; I.V.:1248.3] Out: 1500 [Urine:1500] Intake/Output from this shift: Total I/O In: 299 [I.V.:299] Out: 250 [Urine:250]  Labs:  Recent Labs  06/07/15 0600 06/08/15 0550 06/09/15 0701  WBC 11.0 10.2 7.0  HGB 8.0* 7.1* 7.4*  PLT 367 375 334  CREATININE 2.02* 2.00* 1.36*   Estimated Creatinine Clearance: 29 mL/min (by C-G formula based on Cr of 1.36).  Recent Labs  06/09/15 0701  VANCOTROUGH 35*    *>> Note- Dose hung 9/26 at 0524 & trough drawn at 0701-AFTER dose given *<<  Microbiology: Recent Results (from the past 720 hour(s))  Blood culture (routine x 2)     Status: None (Preliminary result)   Collection Time: 06/05/15  6:48 PM  Result Value Ref Range Status   Specimen Description BLOOD LEFT ARM  Final   Special Requests BOTTLES DRAWN AEROBIC AND ANAEROBIC 2CC  Final   Culture NO GROWTH 4 DAYS  Final   Report Status PENDING  Incomplete  MRSA PCR Screening     Status: None   Collection Time: 06/05/15 10:55 PM  Result Value Ref Range Status   MRSA by PCR NEGATIVE NEGATIVE Final    Comment:        The GeneXpert MRSA Assay (FDA approved for NASAL specimens only), is one component of a comprehensive MRSA colonization surveillance program. It is not intended to diagnose MRSA infection nor to guide or monitor treatment for MRSA infections.      Medical History: Past Medical History  Diagnosis Date  . Cancer   . Blood transfusion without reported diagnosis   . CHF (congestive heart failure)   . Coronary artery disease   . Hypertension   . Shortness of breath dyspnea   . Diabetes mellitus without complication   . Peripheral vascular disease     Medications:  Scheduled:  . allopurinol  200 mg Oral Daily  . antiseptic oral rinse  7 mL Mouth Rinse BID  . aspirin EC  81 mg Oral Daily  . atorvastatin  20 mg Oral QHS  . docusate sodium  100 mg Oral BID  . escitalopram  5 mg Oral Daily  . furosemide  40 mg Intravenous Once  . insulin aspart  0-15 Units Subcutaneous TID WC  . insulin aspart  0-5 Units Subcutaneous QHS  . insulin glargine  30 Units Subcutaneous QHS  . isosorbide mononitrate  60 mg Oral Daily  . loratadine  10 mg Oral Daily  . multivitamin with minerals  1 tablet Oral Daily  . pantoprazole  40 mg Oral BID  . potassium chloride  10 mEq Oral BID  . temazepam  15 mg Oral QHS  . vancomycin  1,000 mg Intravenous Q48H   Assessment: 81 yo female who presented to the ED c/o right leg pain/weakness with swelling and redness, possible cellulitis. Pharmacy consulted for vancomycin dosing. 9/26: patient now s/p Above the Knee amputation.  PK  parameters: Dosing Weight: 65.6 kg Ke: 0.02 T1/2: 35 hrs Vd: 45.92 kg  Goal of Therapy:  Vancomycin trough level 10-15 mcg/ml  Plan:  Pt received vancomycin x1 in the ED. Will start vancomycin  IV Q48H starting ~36 hours after first dose for stacked dosing. Trough prior to 3rd dose (will not be at steady state).  9/26: Vancomycin trough drawn AFTER dose given this am. (Level= 37, This could be considered closer to a peak level). Dose charted as given at 0524 (scheduled for 0630) and Trough scheduled for 0600 was drawn at 0701.  Scr has improved some. Calculations still support continuing current dose of 1 gram IV q48h. Will schedule trough prior to next dose on  9/28 at 0600. Patient s/p AKA of right leg.  Pharmacy will continue to follow with you.  Angelique Blonder, PharmD Clinical Pharmacist 06/09/2015,9:32 AM

## 2015-06-09 NOTE — Consult Note (Signed)
ORTHOPAEDIC CONSULTATION  REQUESTING PHYSICIAN: Danella Penton, MD  Chief Complaint:   Right humeral shaft fracture.  History of Present Illness: [name] is an 81 y.o. female who is now 3 weeks status post a right humeral shaft fracture which is being managed nonsurgically. She was seen in my office 1.5 weeks ago, at which time she was placed into a coaptation splint. She was scheduled to follow-up back in my office today. However, because of other medical issues, specifically vascular insufficiency of the right lower extremity requiring a recent above-knee amputation, she has been hospitalized. I have been asked to see her today in consultation to continue tear of the right humeral shaft fracture. The patient denies any complaints in the splint, other than being frustrated with the limitation in her the use of her arm. She denies any numbness or paresthesias down her arm to her hand, and is not complaining of any pain in the arm.  Past Medical History  Diagnosis Date  . Cancer   . Blood transfusion without reported diagnosis   . CHF (congestive heart failure)   . Coronary artery disease   . Hypertension   . Shortness of breath dyspnea   . Diabetes mellitus without complication   . Peripheral vascular disease    Past Surgical History  Procedure Laterality Date  . Back surgery    . Coronary artery bypass graft    . Vascular surgery    . Peripheral vascular catheterization Right 05/27/2015    Procedure: Lower Extremity Angiography;  Surgeon: Renford Dills, MD;  Location: [facility];  Service: Cardiovascular;  Laterality: Right;  . Peripheral vascular catheterization  05/27/2015    Procedure: Lower Extremity Intervention;  Surgeon: Renford Dills, MD;  Location: [facility];  Service: Cardiovascular;;  . Amputation Right 06/06/2015    Procedure: AMPUTATION ABOVE KNEE;  Surgeon: Annice Needy, MD;   Location: [facility];  Service: Vascular;  Laterality: Right;   Social History   Social History  . Marital Status: Widowed    Spouse Name: N/A  . Number of Children: N/A  . Years of Education: N/A   Social History Main Topics  . Smoking status: Never Smoker   . Smokeless tobacco: None  . Alcohol Use: No  . Drug Use: No  . Sexual Activity: Not Asked   Other Topics Concern  . None   Social History Narrative   Family History  Problem Relation Age of Onset  . Diabetes Mother   . Diabetes Father    Allergies  Allergen Reactions  . Dilaudid [Hydromorphone Hcl] Nausea And Vomiting  . Morphine And Related Hives and Other (See Comments)    Reaction:  Lightheadedness    Prior to Admission medications   Medication Sig Start Date End Date Taking? Authorizing Provider  acetaminophen (TYLENOL) 325 MG tablet Take 2 tablets (650 mg total) by mouth every 6 (six) hours as needed for mild pain (or Fever >/= 101). 05/31/15  Yes Kimberly A Stegmayer, PA-C  acetaminophen (TYLENOL) 650 MG suppository Place 1 suppository (650 mg total) rectally every 6 (six) hours as needed for mild pain (or Fever >/= 101). 05/31/15  Yes Kimberly A Stegmayer, PA-C  allopurinol (ZYLOPRIM) 100 MG tablet Take 200 mg by mouth daily.   Yes Historical Provider, MD  ALPRAZolam Prudy Feeler) 0.25 MG tablet One tab twice a day as needed for anxiety Patient taking differently: Take 0.25 mg by mouth 2 (two) times daily.  05/31/15  Yes  Kimberly A Stegmayer, PA-C  aspirin EC 81 MG tablet Take 81 mg by mouth daily.   Yes Historical Provider, MD  atorvastatin (LIPITOR) 20 MG tablet Take 20 mg by mouth at bedtime.   Yes Historical Provider, MD  azelastine (ASTELIN) 0.1 % nasal spray Place 1 spray into both nostrils daily as needed for rhinitis.    Yes Historical Provider, MD  bisoprolol-hydrochlorothiazide (ZIAC) 10-6.25 MG per tablet Take 1 tablet by mouth 2 (two) times daily.   Yes Historical Provider, MD  clopidogrel (PLAVIX) 75 MG  tablet Take 75 mg by mouth daily.   Yes Historical Provider, MD  docusate sodium (COLACE) 100 MG capsule Take 1 capsule (100 mg total) by mouth 2 (two) times daily. 05/31/15  Yes Kimberly A Stegmayer, PA-C  escitalopram (LEXAPRO) 5 MG tablet Take 1 tablet (5 mg total) by mouth daily. 05/31/15  Yes Kimberly A Stegmayer, PA-C  famotidine (PEPCID) 20 MG tablet Take 1 tablet (20 mg total) by mouth 2 (two) times daily. 05/31/15  Yes Kimberly A Stegmayer, PA-C  fexofenadine (ALLEGRA) 60 MG tablet Take 60 mg by mouth daily as needed for allergies.    Yes Historical Provider, MD  insulin aspart (NOVOLOG) 100 UNIT/ML injection Inject 4-17 Units into the skin 4 (four) times daily -  before meals and at bedtime. Pt takes per sliding scale:    201-250:  4 units  251-300:  7 units  301-350:  10 units  351-450:  13 units  Greater than 450:  17 units and call MD   Yes Historical Provider, MD  insulin detemir (LEVEMIR) 100 UNIT/ML injection Inject 60 Units into the skin at bedtime.   Yes Historical Provider, MD  isosorbide mononitrate (IMDUR) 60 MG 24 hr tablet Take 60 mg by mouth daily.   Yes Historical Provider, MD  losartan-hydrochlorothiazide (HYZAAR) 100-25 MG per tablet Take 1 tablet by mouth daily.   Yes Historical Provider, MD  metolazone (ZAROXOLYN) 2.5 MG tablet Take 2.5 mg by mouth every other day.   Yes Historical Provider, MD  Multiple Vitamin (MULTIVITAMIN WITH MINERALS) TABS tablet Take 1 tablet by mouth daily.   Yes Historical Provider, MD  nitroGLYCERIN (NITRODUR - DOSED IN MG/24 HR) 0.4 mg/hr patch Place 1 patch (0.4 mg total) onto the skin every 12 (twelve) hours as needed (Angina). Place one patch onto skin as needed. Then remove for 12 hours. Patient taking differently: Place 0.4 mg onto the skin every 12 (twelve) hours as needed (for angina). Place one patch onto skin as needed. Then remove for 12 hours. 05/31/15  Yes Kimberly A Stegmayer, PA-C  Oxycodone HCl 10 MG TABS Take 10 mg by mouth every  6 (six) hours.   Yes Historical Provider, MD  oxyCODONE-acetaminophen (PERCOCET/ROXICET) 5-325 MG per tablet One to two tabs every four hours as needed for pain Patient taking differently: Take 1-2 tablets by mouth every 4 (four) hours as needed for severe pain.  05/31/15  Yes Kimberly A Stegmayer, PA-C  polyethylene glycol (MIRALAX / GLYCOLAX) packet Take 17 g by mouth 2 (two) times daily.   Yes Historical Provider, MD  potassium chloride SA (K-DUR,KLOR-CON) 20 MEQ tablet Take 40 mEq by mouth daily.   Yes Historical Provider, MD  promethazine (PHENERGAN) 12.5 MG tablet Take 1 tablet (12.5 mg total) by mouth every 8 (eight) hours as needed for nausea or vomiting. 05/18/15  Yes Jennye Moccasin, MD  torsemide (DEMADEX) 20 MG tablet Take 20 mg by mouth daily.   Yes Historical  Provider, MD  omeprazole (PRILOSEC) 20 MG capsule Take 20 mg by mouth daily as needed (for heartburn/indigestion).     Historical Provider, MD   No results found.  Positive ROS: All other systems have been reviewed and were otherwise negative with the exception of those mentioned in the HPI and as above.  Physical Exam: General:  Alert, no acute distress Psychiatric:  Patient is competent for consent with normal mood and affect   Cardiovascular:  No pedal edema Respiratory:  No wheezing, non-labored breathing GI:  Abdomen is soft and non-tender Skin:  No lesions in the area of chief complaint Neurologic:  Sensation intact distally Lymphatic:  No axillary or cervical lymphadenopathy  Orthopedic Exam:  Orthopedic examination is limited to the right upper extremity. The coaptation splint appears to be in good condition and without evidence of loosening. Skin inspection around the proximal and distal ends of the splint demonstrate no abnormalities. The patient is able to shrug her shoulder without any pain in the arm. She also is able to actively flex and extend her fingers and thumb without difficulty. Sensation is intact to  light touch to all digits. She has good capillary refill to all digits.  X-rays:  X-rays of the right humerus are pending.  Assessment: Closed right humeral shaft fracture.  Plan: The treatment options are reviewed with the patient and her family. The patient was scheduled to be seen in my office today for repeat x-rays and consideration of conversion to a humeral fracture brace. Therefore, we will order x-rays of the humerus here in the hospital. In addition, I will proceed with ordering the humeral fracture brace so that perhaps she can be fitted for this prior to her discharge to rehabilitation.  Thank you for asking me to participate in the care of this most pleasant woman. I will be happy to follow her with you.   Maryagnes Amos, MD  Beeper #:  [phone]  06/09/2015 1:30 PM

## 2015-06-09 NOTE — Progress Notes (Signed)
[facility]  Daily Progress Note   Subjective  - 3 Days Post-Op  No events. Phantom sensations and right foot are present, but ischemic pain is much better. Got her arm brace which has been helpful.  Objective Filed Vitals:   06/08/15 1319 06/08/15 2203 06/09/15 0620 06/09/15 1256  BP: 120/43 131/48 156/60 130/55  Pulse: 69 81 91 60  Temp: 97.7 F (36.5 C) 97.6 F (36.4 C) 98 F (36.7 C) 98.2 F (36.8 C)  TempSrc: Oral Oral  Oral  Resp: Height:      Weight:      SpO2: 100% 100% 100% 98%    Intake/Output Summary (Last 24 hours) at 06/09/15 1644 Last data filed at 06/09/15 1451  Gross per 24 hour  Intake   1961 ml  Output   3200 ml  Net  -1239 ml    PULM  CTAB CV  RRR VASC   dressing is clean, dry, and intact Laboratory CBC    Component Value Date/Time   WBC 7.0 06/09/2015 0701   HGB 7.4* 06/09/2015 0701   HCT 23.2* 06/09/2015 0701   PLT 334 06/09/2015 0701    BMET    Component Value Date/Time   NA 138 06/09/2015 0701   K 4.2 06/09/2015 0701   CL 99* 06/09/2015 0701   CO2 34* 06/09/2015 0701   GLUCOSE 264* 06/09/2015 0701   BUN 43* 06/09/2015 0701   CREATININE 1.36* 06/09/2015 0701   CALCIUM 7.7* 06/09/2015 0701   GFRNONAA 34* 06/09/2015 0701   GFRAA 39* 06/09/2015 0701    Assessment/Planning: POD # 3 s/p right above-the-knee amputation   Seems to be doing well. Some phantom pain which is not surprising. Ischemic pain markedly improved  We'll take dressing off tomorrow.  Can go back to skilled nursing facility tomorrow   DEW,JASON  06/09/2015, 4:44 PM

## 2015-06-09 NOTE — Anesthesia Postprocedure Evaluation (Signed)
  Anesthesia Post-op Note  Patient: [name]  Procedure(s) Performed: Procedure(s): AMPUTATION ABOVE KNEE (Right)  Anesthesia type:General  Patient location: PACU  Post pain: Pain level controlled  Post assessment: Post-op Vital signs reviewed, Patient's Cardiovascular Status Stable, Respiratory Function Stable, Patent Airway and No signs of Nausea or vomiting  Post vital signs: Reviewed and stable  Last Vitals:  Filed Vitals:   06/09/15 1256  BP: 130/55  Pulse: 60  Temp: 36.8 C  Resp: 17    Level of consciousness: awake, alert  and patient cooperative  Complications: No apparent anesthesia complications

## 2015-06-10 LAB — CULTURE, BLOOD (ROUTINE X 2): CULTURE: NO GROWTH

## 2015-06-10 LAB — CBC WITH DIFFERENTIAL/PLATELET
Basophils Absolute: 0 10*3/uL (ref 0–0.1)
Basophils Relative: 0 %
EOS PCT: 1 %
Eosinophils Absolute: 0.1 10*3/uL (ref 0–0.7)
HEMATOCRIT: 23.4 % — AB (ref 35.0–47.0)
Hemoglobin: 7.5 g/dL — ABNORMAL LOW (ref 12.0–16.0)
LYMPHS ABS: 0.9 10*3/uL — AB (ref 1.0–3.6)
LYMPHS PCT: 13 %
MCH: 28.4 pg (ref 26.0–34.0)
MCHC: 31.9 g/dL — ABNORMAL LOW (ref 32.0–36.0)
MCV: 89.2 fL (ref 80.0–100.0)
MONO ABS: 0.5 10*3/uL (ref 0.2–0.9)
MONOS PCT: 8 %
NEUTROS ABS: 5.5 10*3/uL (ref 1.4–6.5)
Neutrophils Relative %: 78 %
PLATELETS: 331 10*3/uL (ref 150–440)
RBC: 2.63 MIL/uL — ABNORMAL LOW (ref 3.80–5.20)
RDW: 16.3 % — AB (ref 11.5–14.5)
WBC: 7.1 10*3/uL (ref 3.6–11.0)

## 2015-06-10 LAB — BASIC METABOLIC PANEL
Anion gap: 4 — ABNORMAL LOW (ref 5–15)
BUN: 32 mg/dL — AB (ref 6–20)
CALCIUM: 7.6 mg/dL — AB (ref 8.9–10.3)
CO2: 34 mmol/L — AB (ref 22–32)
Chloride: 101 mmol/L (ref 101–111)
Creatinine, Ser: 1.13 mg/dL — ABNORMAL HIGH (ref 0.44–1.00)
GFR calc Af Amer: 49 mL/min — ABNORMAL LOW (ref >60)
GFR, EST NON AFRICAN AMERICAN: 42 mL/min — AB (ref >60)
GLUCOSE: 142 mg/dL — AB (ref 65–99)
Potassium: 3.8 mmol/L (ref 3.5–5.1)
Sodium: 139 mmol/L (ref 135–145)

## 2015-06-10 LAB — GLUCOSE, CAPILLARY
GLUCOSE-CAPILLARY: 330 mg/dL — AB (ref 65–99)
Glucose-Capillary: 162 mg/dL — ABNORMAL HIGH (ref 65–99)
Glucose-Capillary: 125 mg/dL — ABNORMAL HIGH (ref 65–99)
Glucose-Capillary: 135 mg/dL — ABNORMAL HIGH (ref 65–99)

## 2015-06-10 LAB — TYPE AND SCREEN
ABO/RH(D): A POS
ANTIBODY SCREEN: NEGATIVE

## 2015-06-10 LAB — VANCOMYCIN, RANDOM: VANCOMYCIN RM: 10 ug/mL

## 2015-06-10 MED ORDER — OXYCODONE-ACETAMINOPHEN 7.5-325 MG PO TABS
1.0000 | ORAL_TABLET | ORAL | Status: DC | PRN
  Administered 2015-06-10 – 2015-06-11 (×2): 1 via ORAL
  Filled 2015-06-10 (×2): qty 1

## 2015-06-10 MED ORDER — TEMAZEPAM 15 MG PO CAPS: 15.0000 mg | ORAL_CAPSULE | Freq: Every day | ORAL | Status: DC

## 2015-06-10 MED ORDER — METOPROLOL TARTRATE 25 MG PO TABS
25.0000 mg | ORAL_TABLET | Freq: Three times a day (TID) | ORAL | Status: DC
  Administered 2015-06-10 – 2015-06-11 (×2): 25 mg via ORAL
  Filled 2015-06-10 (×2): qty 1

## 2015-06-10 MED ORDER — SPIRONOLACTONE 25 MG PO TABS
25.0000 mg | ORAL_TABLET | Freq: Every day | ORAL | Status: DC
  Administered 2015-06-11: 25 mg via ORAL
  Filled 2015-06-10: qty 1

## 2015-06-10 MED ORDER — ALPRAZOLAM 0.25 MG PO TABS: 0.2500 mg | ORAL_TABLET | Freq: Three times a day (TID) | ORAL | Status: DC

## 2015-06-10 MED ORDER — POTASSIUM CHLORIDE CRYS ER 10 MEQ PO TBCR
10.0000 meq | EXTENDED_RELEASE_TABLET | Freq: Two times a day (BID) | ORAL | Status: DC
  Administered 2015-06-10: 10 meq via ORAL
  Filled 2015-06-10: qty 1

## 2015-06-10 MED ORDER — SPIRONOLACTONE 25 MG PO TABS: 25.0000 mg | ORAL_TABLET | Freq: Every day | ORAL | Status: DC

## 2015-06-10 MED ORDER — METOPROLOL TARTRATE 25 MG PO TABS: 25.0000 mg | ORAL_TABLET | Freq: Three times a day (TID) | ORAL | Status: AC

## 2015-06-10 MED ORDER — TORSEMIDE 20 MG PO TABS
10.0000 mg | ORAL_TABLET | Freq: Every day | ORAL | Status: DC
  Administered 2015-06-10 – 2015-06-11 (×2): 10 mg via ORAL
  Filled 2015-06-10 (×2): qty 1

## 2015-06-10 MED ORDER — ALPRAZOLAM 0.25 MG PO TABS
0.2500 mg | ORAL_TABLET | Freq: Three times a day (TID) | ORAL | Status: DC
  Administered 2015-06-10 – 2015-06-11 (×4): 0.25 mg via ORAL
  Filled 2015-06-10 (×5): qty 1

## 2015-06-10 MED ORDER — LOSARTAN POTASSIUM 50 MG PO TABS
50.0000 mg | ORAL_TABLET | Freq: Every day | ORAL | Status: DC
  Administered 2015-06-10: 50 mg via ORAL
  Filled 2015-06-10: qty 1

## 2015-06-10 MED ORDER — TORSEMIDE 10 MG PO TABS: 10.0000 mg | ORAL_TABLET | Freq: Every day | ORAL | Status: DC

## 2015-06-10 MED ORDER — PANTOPRAZOLE SODIUM 40 MG PO TBEC: 40.0000 mg | DELAYED_RELEASE_TABLET | Freq: Two times a day (BID) | ORAL | Status: AC

## 2015-06-10 MED ORDER — MAGNESIUM HYDROXIDE 400 MG/5ML PO SUSP
45.0000 mL | Freq: Once | ORAL | Status: AC
  Administered 2015-06-10: 45 mL via ORAL
  Filled 2015-06-10: qty 60

## 2015-06-10 MED ORDER — BISACODYL 10 MG RE SUPP
10.0000 mg | Freq: Once | RECTAL | Status: AC
  Administered 2015-06-10: 10 mg via RECTAL
  Filled 2015-06-10: qty 1

## 2015-06-10 MED ORDER — VANCOMYCIN HCL IN DEXTROSE 1-5 GM/200ML-% IV SOLN
1000.0000 mg | INTRAVENOUS | Status: DC
  Administered 2015-06-10: 1000 mg via INTRAVENOUS
  Filled 2015-06-10 (×2): qty 200

## 2015-06-10 MED ORDER — INSULIN DETEMIR 100 UNIT/ML ~~LOC~~ SOLN: 42.0000 [IU] | Freq: Every day | SUBCUTANEOUS | Status: DC

## 2015-06-10 NOTE — Progress Notes (Signed)
ANTIBIOTIC CONSULT NOTE - Follow up  Pharmacy Consult for Vancomycin Indication: Cellulitis  Allergies  Allergen Reactions  . Dilaudid [Hydromorphone Hcl] Nausea And Vomiting  . Morphine And Related Hives and Other (See Comments)    Reaction:  Lightheadedness     Patient Measurements: Height:  (149.9 cm) Weight: 211 lb (95.709 kg) IBW/kg (Calculated) : 43.2 Adjusted Body Weight: 65.6 kg  Vital Signs: Temp: 98.7 F (37.1 C) (09/27 1228) Temp Source: Oral (09/27 1228) BP: 144/79 mmHg (09/27 1228) Pulse Rate: 74 (09/27 1228) Intake/Output from previous day: 09/26 0701 - 09/27 0700 In: 1747 [P.O.:480; I.V.:1267] Out: 3350 [Urine:3350] Intake/Output from this shift: Total I/O In: 360 [P.O.:360] Out: 2600 [Urine:2600]  Labs:  Recent Labs  06/08/15 0550 06/09/15 0701 06/10/15 0506  WBC 10.2 7.0 7.1  HGB 7.1* 7.4* 7.5*  PLT 375 334 331  CREATININE 2.00* 1.36* 1.13*   Estimated Creatinine Clearance: 34.9 mL/min (by C-G formula based on Cr of 1.13).  Recent Labs  06/09/15 0701 06/10/15 1714  VANCOTROUGH 35*  --   VANCORANDOM  --  10    *>> Note- Dose hung 9/26 at 0524 & trough drawn at 0701-AFTER dose given *<<  Microbiology: Recent Results (from the past 720 hour(s))  Blood culture (routine x 2)     Status: None   Collection Time: 06/05/15  6:48 PM  Result Value Ref Range Status   Specimen Description BLOOD LEFT ARM  Final   Special Requests BOTTLES DRAWN AEROBIC AND ANAEROBIC 2CC  Final   Culture NO GROWTH 5 DAYS  Final   Report Status 06/10/2015 FINAL  Final  MRSA PCR Screening     Status: None   Collection Time: 06/05/15 10:55 PM  Result Value Ref Range Status   MRSA by PCR NEGATIVE NEGATIVE Final    Comment:        The GeneXpert MRSA Assay (FDA approved for NASAL specimens only), is one component of a comprehensive MRSA colonization surveillance program. It is not intended to diagnose MRSA infection nor to guide or monitor treatment  for MRSA infections.     Medical History: Past Medical History  Diagnosis Date  . Cancer   . Blood transfusion without reported diagnosis   . CHF (congestive heart failure)   . Coronary artery disease   . Hypertension   . Shortness of breath dyspnea   . Diabetes mellitus without complication   . Peripheral vascular disease     Medications:  Scheduled:  . allopurinol  200 mg Oral Daily  . ALPRAZolam  0.25 mg Oral TID  . antiseptic oral rinse  7 mL Mouth Rinse BID  . aspirin EC  81 mg Oral Daily  . atorvastatin  20 mg Oral QHS  . docusate sodium  100 mg Oral BID  . escitalopram  5 mg Oral Daily  . insulin aspart  0-15 Units Subcutaneous TID WC  . insulin aspart  0-5 Units Subcutaneous QHS  . insulin detemir  42 Units Subcutaneous QHS  . isosorbide mononitrate  60 mg Oral Daily  . loratadine  10 mg Oral Daily  . losartan  50 mg Oral Daily  . multivitamin with minerals  1 tablet Oral Daily  . pantoprazole  40 mg Oral BID  . potassium chloride  10 mEq Oral BID  . temazepam  15 mg Oral QHS  . torsemide  10 mg Oral Daily  . vancomycin  1,000 mg Intravenous Q48H   Assessment: 81 yo female who presented to the  ED c/o right leg pain/weakness with swelling and redness, possible cellulitis. Pharmacy consulted for vancomycin dosing. 9/26: patient now s/p Above the Knee amputation.  PK parameters: Dosing Weight: 65.6 kg Ke: 0.02 T1/2: 35 hrs Vd: 45.92 kg  Goal of Therapy:  Vancomycin trough level 10-15 mcg/ml  Plan:  Pt received vancomycin x1 in the ED. Will start vancomycin  IV Q48H starting ~36 hours after first dose for stacked dosing. Trough prior to 3rd dose (will not be at steady state).  9/26: Vancomycin trough drawn AFTER dose given this am. (Level= 37, This could be considered closer to a peak level). Dose charted as given at 0524 (scheduled for 0630) and Trough scheduled for 0600 was drawn at 0701.  Scr has improved some. Calculations still support  continuing current dose of 1 gram IV q48h. Will schedule trough prior to next dose on 9/28 at 0600. Patient s/p AKA of right leg.  9/27:  Scr improved to 1.13. Will check Vancomycin level 36 hrs after last dose to see if changing from Vancomycin 1 gram Q48h to Q36h is appropriate. Random level ordered for 9/27 at 1700. (Last dose given 9/26 at 0524).  9/27: Vanc level was 10. This supports changing frequency to q 36 hours due to improvement in renal function. Will check trough before the second dose 0929 0630   Olene Floss, PharmD Clinical Pharmacist 06/10/2015,6:53 PM

## 2015-06-10 NOTE — Progress Notes (Signed)
Patient ID: [name], female   DOB: 01/05/1925, 81 y.o.   MRN: [id number] Patient ID: [name], female   DOB: 05/20/1925, 81 y.o.   MRN: [id number] [name] Record is a 81 y.o. female   SUBJECTIVE:  Patient with severe peripheral vascular disease and ischemic right leg was admitted with significant right leg pain. Last admission vascular obstruction in the right leg not amenable to intervention. Patient postop right AKA. Breathing better but has severe pain, reports anxiety ______________________________________________________________________  ROS: Review of systems is unremarkable for any active cardiac,respiratory, GI, GU, hematologic, neurologic or psychiatric systems, 10 systems reviewed.  Marland Kitchen allopurinol  200 mg Oral Daily  . ALPRAZolam  0.25 mg Oral TID  . antiseptic oral rinse  7 mL Mouth Rinse BID  . aspirin EC  81 mg Oral Daily  . atorvastatin  20 mg Oral QHS  . docusate sodium  100 mg Oral BID  . escitalopram  5 mg Oral Daily  . insulin aspart  0-15 Units Subcutaneous TID WC  . insulin aspart  0-5 Units Subcutaneous QHS  . insulin detemir  42 Units Subcutaneous QHS  . isosorbide mononitrate  60 mg Oral Daily  . loratadine  10 mg Oral Daily  . losartan  50 mg Oral Daily  . multivitamin with minerals  1 tablet Oral Daily  . pantoprazole  40 mg Oral BID  . potassium chloride  10 mEq Oral BID  . temazepam  15 mg Oral QHS  . torsemide  10 mg Oral Daily  . vancomycin  1,000 mg Intravenous Q48H   acetaminophen **OR** acetaminophen, HYDROmorphone (DILAUDID) injection, ondansetron **OR** ondansetron (ZOFRAN) IV, ondansetron (ZOFRAN) IV, oxyCODONE-acetaminophen   Past Medical History  Diagnosis Date  . Cancer   . Blood transfusion without reported diagnosis   . CHF (congestive heart failure)   . Coronary artery disease   . Hypertension   . Shortness of breath dyspnea   . Diabetes mellitus without complication   . Peripheral vascular disease     Past Surgical History   Procedure Laterality Date  . Back surgery    . Coronary artery bypass graft    . Vascular surgery    . Peripheral vascular catheterization Right 05/27/2015    Procedure: Lower Extremity Angiography;  Surgeon: Renford Dills, MD;  Location: [facility];  Service: Cardiovascular;  Laterality: Right;  . Peripheral vascular catheterization  05/27/2015    Procedure: Lower Extremity Intervention;  Surgeon: Renford Dills, MD;  Location: [facility];  Service: Cardiovascular;;  . Amputation Right 06/06/2015    Procedure: AMPUTATION ABOVE KNEE;  Surgeon: Annice Needy, MD;  Location: [facility] OR[facility];  Service: Vascular;  Laterality: Right;    PHYSICAL EXAM:  BP 159/78 mmHg  Pulse 78  Temp(Src) 98.4 F (36.9 C) (Oral)  Resp 19  Ht  (1.499 m)  Wt 95.709 kg (211 lb)  BMI 42.59 kg/m2  SpO2 100%  Wt Readings from Last 3 Encounters:  06/05/15 95.709 kg (211 lb)  05/30/15 96.435 kg (212 lb 9.6 oz)  05/18/15 92.534 kg (204 lb)           BP Readings from Last 3 Encounters:  06/10/15 159/78  05/31/15 118/53  05/18/15 156/56    Constitutional: NAD Neck: supple, no thyromegaly Respiratory: Mild basilar rales, mild wheezing Cardiovascular: RRR, 2/6 murmur, no gallop Abdomen: soft, good BS, nontender Extremities: no edema, cold right foot, no pulses Neuro: alert with slight confusion, no focal motor  or sensory deficits  ASSESSMENT/PLAN:  Labs and imaging studies were reviewed  Ischemic right leg -postop AKA, significant pain  Diabetes mellitus, insulin requiring-off D5, following diabetic recommendations Moderate aortic stenosis-avoid hypotension Anemic - hemoglobin 7.5, stable  acute on chronic systolic congestive heart failure - better today post IV Lasix, restart torsemide Chronic renal dysfunction - follow closely, creatinine 1.1  humeral fracture-pain control, ortho follow up this week skilled nursing tomorrow, stabilizing by controlling pain better, off IV  fluids to track sugars closely

## 2015-06-10 NOTE — Progress Notes (Signed)
Subjective: No complaints regarding the right upper extremity. The fracture brace has been applied after removal of the coaptation splint.   Objective: Vital signs in last 24 hours: Temp:  [98.2 F (36.8 C)-98.8 F (37.1 C)] 98.4 F (36.9 C) (09/27 0700) Pulse Rate:  [60-78] 78 (09/27 0700) Resp:  [17-19] 19 (09/27 0700) BP: (130-159)/(44-78) 159/78 mmHg (09/27 0700) SpO2:  [98 %-100 %] 100 % (09/27 0700)  Intake/Output from previous day: 09/26 0701 - 09/27 0700 In: 1747 [P.O.:480; I.V.:1267] Out: 3350 [Urine:3350] Intake/Output this shift:     Recent Labs  06/08/15 0550 06/09/15 0701 06/10/15 0506  HGB 7.1* 7.4* 7.5*    Recent Labs  06/09/15 0701 06/10/15 0506  WBC 7.0 7.1  RBC 2.58* 2.63*  HCT 23.2* 23.4*  PLT 334 331    Recent Labs  06/09/15 0701 06/10/15 0506  NA 138 139  K 4.2 3.8  CL 99* 101  CO2 34* 34*  BUN 43* 32*  CREATININE 1.36* 1.13*  GLUCOSE 264* 142*  CALCIUM 7.7* 7.6*   No results for input(s): LABPT, INR in the last 72 hours.  Physical Exam: Orthopedic examination is limited to the right upper extremity. The fracture brace appears to be on appropriately, given the location of the fracture. Skin inspection is notable for moderate swelling around the upper arm and forearm. Resolving ecchymosis is noted in these areas as well. In addition, there are two small areas of skin ulcerations measuring 1-1.5 cm each on the ulnar hand and wrist regions, probably secondary to irritation from the splint. At this point, there does not appear to be any evidence for infection. The patient has difficulty actively flexing and extending her elbow, but can actively flex and extend her wrist and all digits, and can squeeze my hand. She can shrug her shoulder and exhibits no pain in the fracture site with this motion. She has intact sensation to light touch to all distributions. She has good capillary refill to all digits.  X-rays: X-rays of the right humerus from  yesterday are reviewed. These demonstrated overall satisfactory maintenance of the fracture alignment and both projections. Early callus formation is noted.  Assessment: Status post oblique distal right humeral shaft fracture.  Plan: The patient has been placed into a humeral fracture brace. This is to remain on her at all times, removing only once daily for bathing purposes. She is to be encouraged to use her forearm and hand as much as possible and to actively flex and extend her elbow as much as possible, as this will stimulate healing of the fracture. I have explained this to the patient and her family as well. I will order occupational therapy to work on these exercises with the patient as well. She is to follow-up in my office in 1 month for repeat x-rays of the right humerus. Regarding the 2 small areas of skin irritation on the ulnar side of her wrist and hand, these can be covered with Band-Aids. Antibiotic ointment can be used as indicated.   Excell Seltzer Anita Mcadory 06/10/2015, 10:04 AM

## 2015-06-10 NOTE — Evaluation (Signed)
Physical Therapy Evaluation Patient Details Name: [name] MRN: [id number] DOB: May 15, 1925 Today's Date: 06/10/2015   History of Present Illness  Pt presents w/ occulusion of artery in R leg. Pt has undergone a revascularization procdure but it was not successful. Pt has recently undergone R AKA procedure and still has fx of R humerus which will not be surgically repaired. Pt has orders for safe AROM of elbow and Hand on R to faciliate healing.   Clinical Impression  Upon evaluation, pt appears to be moving around better than she was when she was seen prior to her R AKA. Pt was able to demonstrate good AROM of her RLE in supine (Flex, ABD, ADD) and decent strength in each of these movements (3/5). Pt's RUE has a new splint on it which extends down to the elbow. Pt was encouraged to start AROM of her elbow and reminded that this is safe to perform. Pt taken through general elbow flex/ext, forearm pronation/supination, and hand exercises. Pt strength in RUE at least 3/5 in each for each of these movements. Pt was able to achieve sitting on EOB position from supine with mod assist +2. Mod assist +1 required for maintenance of this position when pt did not use LUE for support. Tended to loser her balance posteriorly and L when in sitting. Pt requires further skilled PT services in order increase AROM and strength in her RLE and RUE. Pt would also benefit from PT in order to improve her overall capacity for functional mobility. Current d/c recommendation is SNF in order for pt to receive close medical attention and physical therapy.     Follow Up Recommendations SNF    Equipment Recommendations       Recommendations for Other Services       Precautions / Restrictions Precautions Precautions: Fall Required Braces or Orthoses: Other Brace/Splint Other Brace/Splint: pt's R arm in hard splint that covers the shoulder; stops at the elbow.  Restrictions Weight Bearing Restrictions: Yes RUE Weight  Bearing: Non weight bearing      Mobility  Bed Mobility Overal bed mobility: +2 for physical assistance             General bed mobility comments: pt was mod assist +2 for getting from supine to seated on EOB; one therapist controlling shoulders in order to limit discomfort of R arm fx and one therapist controlling legs.   Transfers                 General transfer comment: transfers and ambulation not performed today due to pt not tolerating sitting on EOB well and stating that she feels exhausted/ too tired to do anything.   Ambulation/Gait                Stairs            Wheelchair Mobility    Modified Rankin (Stroke Patients Only)       Balance Overall balance assessment: Needs assistance Sitting-balance support: Feet supported;Single extremity supported Sitting balance-Leahy Scale: Poor Sitting balance - Comments: Pt was able to maintain seated balance by supporting herself with her L UE on the bed rail/bed and mod assist +2 for control of her upper body. Pt would fall posteriorly and to her L if upper body support was taken away.   Postural control: Posterior lean;Right lateral lean  Pertinent Vitals/Pain Pain Assessment: 0-10 Pain Score: 8  Pain Intervention(s): Monitored during session;Repositioned    Home Living Family/patient expects to be discharged to:: Private residence Living Arrangements: Alone   Type of Home: Independent living facility Home Access: Elevator     Home Layout: One level Home Equipment: Environmental consultant - 4 wheels      Prior Function Level of Independence: Independent with assistive device(s)         Comments: pt stated that before this occured she was independent with ambulating; however she only went short distances. Pt also states that she was able to dress and bathe herself before her fall.      Hand Dominance        Extremity/Trunk Assessment   Upper  Extremity Assessment: RUE deficits/detail RUE Deficits / Details:  (pt has shoulder ROM limitations due to splint; pt's lower arm ROM defictis noted in flex/ext, sup/pro)         Lower Extremity Assessment: RLE deficits/detail RLE Deficits / Details: pt s/p R AKA       Communication      Cognition Arousal/Alertness: Awake/alert Behavior During Therapy: WFL for tasks assessed/performed Overall Cognitive Status: Within Functional Limits for tasks assessed                      General Comments      Exercises Total Joint Exercises Hip ABduction/ADduction: AROM;15 reps;Supine Straight Leg Raises: AROM;15 reps;Supine General Exercises - Upper Extremity Elbow Flexion: AROM;Right;10 reps Elbow Extension: AROM;Right;10 reps Other Exercises Other Exercises: open and closing of R hand x 10; supination and pronation x 10 on R.       Assessment/Plan    PT Assessment Patient needs continued PT services  PT Diagnosis Generalized weakness;Acute pain   PT Problem List Decreased strength;Decreased range of motion;Decreased activity tolerance;Impaired sensation;Decreased skin integrity;Pain;Decreased mobility  PT Treatment Interventions DME instruction;Gait training;Functional mobility training;Therapeutic activities;Therapeutic exercise;Balance training;Patient/family education   PT Goals (Current goals can be found in the Care Plan section) Acute Rehab PT Goals Patient Stated Goal: Pt wants to be able to move around again like she used to PT Goal Formulation: With patient Time For Goal Achievement: 06/24/15 Potential to Achieve Goals: Fair Additional Goals Additional Goal #1: Pt will tolerate 10 minutes of sitting on EOB in order to demonstrate increased activity tolerance    Frequency Min 2X/week   Barriers to discharge        Co-evaluation               End of Session Equipment Utilized During Treatment: Oxygen Activity Tolerance: Patient limited by  fatigue;Patient limited by pain Patient left: in bed;with bed alarm set;with family/visitor present           Time: 1308-6578 PT Time Calculation (min) (ACUTE ONLY): 27 min   Charges:         PT G CodesGeorgina Peer 06/10/2015, 3:26 PM

## 2015-06-10 NOTE — Clinical Documentation Improvement (Signed)
Hospitalist   Query 1 of 2 Please document the Acuity of the patient's systolic congestive heart failure in the progress notes and discharge summary:  - Acute  - Acute on Chronic  - Chronic  - Other acuity  - Unable to clinically determine  Clinical Information: "systolic congestive heart failure - worsened today, IV Lasix with potassium" documented by Dr. Hyacinth Meeker progress note 06/09/15.  Patient received Lasix 40 mg IV on 06/09/15. Total Urine Output for 06/09/15 - 3350 mls.  Query 2 of 2  Possible Clinical Conditions:  - Acute Kidney Injury on Stage 3 CKD, resolved.  - Other Condition  - Unable to clinically determine  Clinical Information:  - Known history of Stage 3 CKD per ED provider note  - "Chronic renal dysfunction - follow closely" documented in the hospitalist's progress notes  - Patient's creatinine has improved 0.64 mg/dL from 96/29/52 to 84/13/24 and 0.87 mg/dL from 40/10/27 to 25/36/64   (please document your response in the progress notes and not on the query form itself.)   Please exercise your independent, professional judgment when responding. A specific answer is not anticipated or expected.   Thank You, Jerral Ralph  RN BSN CCDS [phone] [facility] Information Management [place]

## 2015-06-10 NOTE — Progress Notes (Signed)
ANTIBIOTIC CONSULT NOTE - Follow up  Pharmacy Consult for Vancomycin Indication: Cellulitis  Allergies  Allergen Reactions  . Dilaudid [Hydromorphone Hcl] Nausea And Vomiting  . Morphine And Related Hives and Other (See Comments)    Reaction:  Lightheadedness     Patient Measurements: Height:  (149.9 cm) Weight: 211 lb (95.709 kg) IBW/kg (Calculated) : 43.2 Adjusted Body Weight: 65.6 kg  Vital Signs: Temp: 98.7 F (37.1 C) (09/27 1228) Temp Source: Oral (09/27 1228) BP: 144/79 mmHg (09/27 1228) Pulse Rate: 74 (09/27 1228) Intake/Output from previous day: 09/26 0701 - 09/27 0700 In: 1747 [P.O.:480; I.V.:1267] Out: 3350 [Urine:3350] Intake/Output from this shift: Total I/O In: 120 [P.O.:120] Out: 200 [Urine:200]  Labs:  Recent Labs  06/08/15 0550 06/09/15 0701 06/10/15 0506  WBC 10.2 7.0 7.1  HGB 7.1* 7.4* 7.5*  PLT 375 334 331  CREATININE 2.00* 1.36* 1.13*   Estimated Creatinine Clearance: 34.9 mL/min (by C-G formula based on Cr of 1.13).  Recent Labs  06/09/15 0701  VANCOTROUGH 35*    *>> Note- Dose hung 9/26 at 0524 & trough drawn at 0701-AFTER dose given *<<  Microbiology: Recent Results (from the past 720 hour(s))  Blood culture (routine x 2)     Status: None   Collection Time: 06/05/15  6:48 PM  Result Value Ref Range Status   Specimen Description BLOOD LEFT ARM  Final   Special Requests BOTTLES DRAWN AEROBIC AND ANAEROBIC 2CC  Final   Culture NO GROWTH 5 DAYS  Final   Report Status 06/10/2015 FINAL  Final  MRSA PCR Screening     Status: None   Collection Time: 06/05/15 10:55 PM  Result Value Ref Range Status   MRSA by PCR NEGATIVE NEGATIVE Final    Comment:        The GeneXpert MRSA Assay (FDA approved for NASAL specimens only), is one component of a comprehensive MRSA colonization surveillance program. It is not intended to diagnose MRSA infection nor to guide or monitor treatment for MRSA infections.     Medical  History: Past Medical History  Diagnosis Date  . Cancer   . Blood transfusion without reported diagnosis   . CHF (congestive heart failure)   . Coronary artery disease   . Hypertension   . Shortness of breath dyspnea   . Diabetes mellitus without complication   . Peripheral vascular disease     Medications:  Scheduled:  . allopurinol  200 mg Oral Daily  . ALPRAZolam  0.25 mg Oral TID  . antiseptic oral rinse  7 mL Mouth Rinse BID  . aspirin EC  81 mg Oral Daily  . atorvastatin  20 mg Oral QHS  . bisacodyl  10 mg Rectal Once  . docusate sodium  100 mg Oral BID  . escitalopram  5 mg Oral Daily  . insulin aspart  0-15 Units Subcutaneous TID WC  . insulin aspart  0-5 Units Subcutaneous QHS  . insulin detemir  42 Units Subcutaneous QHS  . isosorbide mononitrate  60 mg Oral Daily  . loratadine  10 mg Oral Daily  . losartan  50 mg Oral Daily  . multivitamin with minerals  1 tablet Oral Daily  . pantoprazole  40 mg Oral BID  . potassium chloride  10 mEq Oral BID  . temazepam  15 mg Oral QHS  . torsemide  10 mg Oral Daily  . vancomycin  1,000 mg Intravenous Q48H   Assessment: 81 yo female who presented to the ED c/o right  leg pain/weakness with swelling and redness, possible cellulitis. Pharmacy consulted for vancomycin dosing. 9/26: patient now s/p Above the Knee amputation.  PK parameters: Dosing Weight: 65.6 kg Ke: 0.02 T1/2: 35 hrs Vd: 45.92 kg  Goal of Therapy:  Vancomycin trough level 10-15 mcg/ml  Plan:  Pt received vancomycin x1 in the ED. Will start vancomycin  IV Q48H starting ~36 hours after first dose for stacked dosing. Trough prior to 3rd dose (will not be at steady state).  9/26: Vancomycin trough drawn AFTER dose given this am. (Level= 37, This could be considered closer to a [facility] level). Dose charted as given at 0524 (scheduled for 0630) and Trough scheduled for 0600 was drawn at 0701.  Scr has improved some. Calculations still support continuing  current dose of 1 gram IV q48h. Will schedule trough prior to next dose on 9/28 at 0600. Patient s/p AKA of right leg.  9/27:  Scr improved to 1.13. Will check Vancomycin level 36 hrs after last dose to see if changing from Vancomycin 1 gram Q48h to Q36h is appropriate. Random level ordered for 9/27 at 1700. (Last dose given 9/26 at 0524).   Angelique Blonder, PharmD Clinical Pharmacist 06/10/2015,1:45 PM

## 2015-06-10 NOTE — Progress Notes (Addendum)
MD called for PT order. Also obtained an order for carb modified heath healthy diet as opposed to a regular diet. No BM noted since the 19th will order Milk of Mag. Daughter at bedside and is overly attentive to patient and pt is becoming very anxious will give scheduled Xanax. Provided daughter with extensive education on medications, positioning, diet, pain control and all other things being done for patient in attempt to provide with relief. Daughter seems upset with staff and situation but seems somewhat relieved after education. Informed MD of blister like spots on right wrist. He states they are "irritation from the previous splint" instructed RN to apply foam dressing or bandaid

## 2015-06-10 NOTE — Progress Notes (Signed)
[facility]  Daily Progress Note   Subjective  - 4 Days Post-Op  No events.  Pain control is ok  Objective Filed Vitals:   06/09/15 0620 06/09/15 1256 06/09/15 2100 06/10/15 0700  BP: 156/60 130/55 136/44 159/78  Pulse: 91 60 77 78  Temp: 98 F (36.7 C) 98.2 F (36.8 C) 98.8 F (37.1 C) 98.4 F (36.9 C)  TempSrc:  Oral Oral Oral  Resp: Height:      Weight:      SpO2: 100% 98% 100% 100%    Intake/Output Summary (Last 24 hours) at 06/10/15 1018 Last data filed at 06/10/15 0800  Gross per 24 hour  Intake   1448 ml  Output   3100 ml  Net  -1652 ml    PULM  CTAB CV  RRR VASC  Incision C/d/i  Laboratory CBC    Component Value Date/Time   WBC 7.1 06/10/2015 0506   HGB 7.5* 06/10/2015 0506   HCT 23.4* 06/10/2015 0506   PLT 331 06/10/2015 0506    BMET    Component Value Date/Time   NA 139 06/10/2015 0506   K 3.8 06/10/2015 0506   CL 101 06/10/2015 0506   CO2 34* 06/10/2015 0506   GLUCOSE 142* 06/10/2015 0506   BUN 32* 06/10/2015 0506   CREATININE 1.13* 06/10/2015 0506   CALCIUM 7.6* 06/10/2015 0506   GFRNONAA 42* 06/10/2015 0506   GFRAA 49* 06/10/2015 0506    Assessment/Planning: POD #4 s/p right AKA   Wound looks good  OK to go to SNF today  RTC 3 weeks for wound check    DEW,JASON  06/10/2015, 10:18 AM

## 2015-06-10 NOTE — Progress Notes (Signed)
CSW following for dc planning. Pt is from [facility] and plans to return there at dc. CSW updated SNF and they will be ready for Pt at dc. CSW met with Pt's family and they are pleased with care at [facility] and Pt's return at dc.   Toma Copier, [place]

## 2015-06-11 LAB — GLUCOSE, CAPILLARY
Glucose-Capillary: 86 mg/dL (ref 65–99)
Glucose-Capillary: 105 mg/dL — ABNORMAL HIGH (ref 65–99)

## 2015-06-11 LAB — SURGICAL PATHOLOGY

## 2015-06-11 MED ORDER — DILTIAZEM HCL 60 MG PO TABS: 60.0000 mg | ORAL_TABLET | Freq: Three times a day (TID) | ORAL | Status: DC

## 2015-06-11 MED ORDER — DILTIAZEM HCL 30 MG PO TABS
30.0000 mg | ORAL_TABLET | Freq: Three times a day (TID) | ORAL | Status: DC
  Administered 2015-06-11: 30 mg via ORAL
  Filled 2015-06-11 (×3): qty 1

## 2015-06-11 MED ORDER — DILTIAZEM HCL 25 MG/5ML IV SOLN
10.0000 mg | Freq: Once | INTRAVENOUS | Status: AC
  Administered 2015-06-11: 10 mg via INTRAVENOUS
  Filled 2015-06-11: qty 5

## 2015-06-11 MED ORDER — INSULIN ASPART 100 UNIT/ML ~~LOC~~ SOLN: 0.0000 [IU] | Freq: Three times a day (TID) | SUBCUTANEOUS | Status: DC

## 2015-06-11 MED ORDER — BISACODYL 10 MG RE SUPP: 10.0000 mg | Freq: Once | RECTAL | Status: DC

## 2015-06-11 MED ORDER — DILTIAZEM HCL 30 MG PO TABS: 30.0000 mg | ORAL_TABLET | Freq: Three times a day (TID) | ORAL | Status: DC

## 2015-06-11 NOTE — Progress Notes (Signed)
Checked to see if pre-authorization would be needed for non-emergent EMS transport. Per [employer] benefits obtained online through [employer], patient has a [employer] Medicare Advantage PPO policy.  Medicare  Medicare PPO plans do not require pre-auth for non-emergent ground transports using service codes A0426 or A0428.   °

## 2015-06-11 NOTE — Discharge Summary (Signed)
[name], is a 81 y.o. female  DOB 09/03/1925  MRN [id number].  Admission date:  06/05/2015  Admitting Physician  Wyatt Haste, MD  Discharge Date:  06/11/2015    Admission Diagnosis  Cellulitis of right lower extremity [L03.115] Necrotic toes [I96]  Discharge Diagnoses    Right leg ischemia, post right AKA Chronic atrial fibrillation Acute on chronic systolic congestive heart failure CKD3, stable Diabetes mellitus, insulin requiring Peripheral vascular disease   Past Medical History  Diagnosis Date  . Cancer   . Blood transfusion without reported diagnosis   . CHF (congestive heart failure)   . Coronary artery disease   . Hypertension   . Shortness of breath dyspnea   . Diabetes mellitus without complication   . Peripheral vascular disease     Past Surgical History  Procedure Laterality Date  . Back surgery    . Coronary artery bypass graft    . Vascular surgery    . Peripheral vascular catheterization Right 05/27/2015    Procedure: Lower Extremity Angiography;  Surgeon: Renford Dills, MD;  Location: [facility] INVASIVE CV LAB;  Service: Cardiovascular;  Laterality: Right;  . Peripheral vascular catheterization  05/27/2015    Procedure: Lower Extremity Intervention;  Surgeon: Renford Dills, MD;  Location: [facility] INVASIVE CV LAB;  Service: Cardiovascular;;  . Amputation Right 06/06/2015    Procedure: AMPUTATION ABOVE KNEE;  Surgeon: Annice Needy, MD;  Location: [facility] ORS;  Service: Vascular;  Laterality: Right;       History of present illness and  Hospital Course:     Kindly see H&P for history of present illness and admission details, please review complete Labs, Consult reports and Test reports for all details in brief  HPI  from the history and physical done on the day of admission    Hospital Course    Patient was admitted and underwent right AKA, had significant phantom  pain, better now. She had one episode of rapid A. fib, treated with IV diltiazem. Hemoglobin stable at 7.5, creatinine stable. She has long-term trouble with systolic congestive heart failure, currently compensated, follow electrolytes closely with close follow-up of volume status. She is had moderate constipation with pain meds. Her sugars overall have been controlled with at bedtime Lantus insulin and sliding scale before meals insulin.   Discharge Condition: Stable   Follow UP  Dr. Hyacinth Meeker 2 weeks    Discharge Instructions  and  Discharge Medications   Tylenol 650 g every 6 when necessary pain Allopurinol 200 mg daily Xanax 0.25 mg 3 times a day Aspirin 81 mg daily Lipitor 20 mg at bedtime Diltiazem 30 mg 3 times a day Colace 100 mg twice a day Lexapro 5 g daily Allegra 60 g daily Sliding-scale insulin every before meals Levemir insulin 42 units at bedtime Imdur 60 mg daily Metoprolol tartrate 25 mg 3 times a day Multivitamin daily Percocet 5/325 one to 2 tabs every 4 when necessary pain Protonix 40 mg twice a day MiraLAX 17 g twice a  day Spironolactone 25 mg daily Temazepam 15 mg at bedtime Torsemide 10 mg daily  Today   Subjective:   [name] today stable for discharge to skilled nursing   Objective:   Blood pressure 120/56, pulse 83, temperature 97.7 F (36.5 C), temperature source Oral, resp. rate 18, height  (1.499 m), weight 95.709 kg (211 lb), SpO2 99 %.   Exam Awake Alert, Oriented x 3, No new F.N deficits, Normal affect [name].AT,PERRAL Supple Neck,No JVD, No cervical lymphadenopathy appriciated.  Symmetrical Chest wall movement, Good air movement bilaterally, CTAB RRR,No Gallops,Rubs or new Murmurs, Abd Soft, Non tender, No rebound. No Cyanosis, Clubbing or edema, No new Rash or bruise  Total Time in preparing paper work, data evaluation and todays exam - 35 minutes  MILLER,MARK F. M.D on 06/11/2015 at 7:27 AM

## 2015-06-11 NOTE — Progress Notes (Signed)
Dr. Clint Guy notified of pt. persistant tachycardia; new orders written; instructed to call Dr. Hyacinth Meeker; Windy Carina, RN; 12:24 AM; 06/11/2015

## 2015-06-11 NOTE — Progress Notes (Signed)
Dr. Dan Humphreys, on-call for Dr. Hyacinth Meeker, notified of tachycardia, Lopressor and Cardizem orders and conversation with Dr. Clint Guy. Windy Carina, RN; 12:29 AM; 06/11/2015

## 2015-06-11 NOTE — Clinical Social Work Placement (Signed)
   CLINICAL SOCIAL WORK PLACEMENT  NOTE  Date:  06/11/2015  Patient Details  Name: [name] MRN: [id number] Date of Birth: 1925/09/02  Clinical Social Work is seeking post-discharge placement for this patient at the Skilled  Nursing Facility level of care (*CSW will initial, date and re-position this form in  chart as items are completed):  Yes   Patient/family provided with [facility] Clinical Social Work Department's list of facilities offering this level of care within the geographic area requested by the patient (or if unable, by the patient's family).  Yes   Patient/family informed of their freedom to choose among providers that offer the needed level of care, that participate in Medicare, Medicaid or managed care program needed by the patient, have an available bed and are willing to accept the patient.  Yes   Patient/family informed of [facility]'s ownership interest in [facility] and [facility], as well as of the fact that they are under no obligation to receive care at these facilities.  PASRR submitted to EDS on       PASRR number received on       Existing PASRR number confirmed on 06/04/15     FL2 transmitted to all facilities in geographic area requested by pt/family on 06/04/15     FL2 transmitted to all facilities within larger geographic area on       Patient informed that his/her managed care company has contracts with or will negotiate with certain facilities, including the following:        Yes   Patient/family informed of bed offers received.  Patient chooses bed at  [facility])     Physician recommends and patient chooses bed at  [facility])    Patient to be transferred to  ([facility]) on 06/11/15.  Patient to be transferred to facility by  (EMS)     Patient family notified on 06/11/15 of transfer.  Name of family member notified:   (son )     PHYSICIAN       Additional Comment:     _______________________________________________ York Spaniel, LCSW 06/11/2015, 10:22 AM

## 2015-06-11 NOTE — Clinical Social Work Note (Signed)
Patient discharging today to [facility]. [facility] at [facility] is aware and discharge summary sent. Patient's son is aware and requesting transport via EMS. York Spaniel MSW,LCSW [phone]

## 2015-06-11 NOTE — Care Management Important Message (Signed)
Important Message  Patient Details  Name: [name] MRN: [id number] Date of Birth: August 20, 1925   Medicare Important Message Given:  Yes-third notification given    [name] 06/11/2015, 9:52 AM

## 2015-07-05 LAB — TYPE AND SCREEN
ABO/RH(D): A POS
ANTIBODY SCREEN: NEGATIVE
UNIT DIVISION: 0

## 2015-08-18 ENCOUNTER — Inpatient Hospital Stay: Admit: 2015-08-18 | Payer: Medicare Other

## 2015-08-18 ENCOUNTER — Emergency Department: Payer: Medicare Other

## 2015-08-18 ENCOUNTER — Inpatient Hospital Stay
Admission: EM | Admit: 2015-08-18 | Discharge: 2015-08-24 | DRG: 280 | Disposition: A | Discharge status: Skilled Nursing Facility | Payer: Medicare Other | Attending: Internal Medicine | Admitting: Internal Medicine

## 2015-08-18 DIAGNOSIS — Z886 Allergy status to analgesic agent status: Secondary | ICD-10-CM | POA: Diagnosis not present

## 2015-08-18 DIAGNOSIS — E1122 Type 2 diabetes mellitus with diabetic chronic kidney disease: Secondary | ICD-10-CM | POA: Diagnosis present

## 2015-08-18 DIAGNOSIS — Z89612 Acquired absence of left leg above knee: Secondary | ICD-10-CM | POA: Diagnosis not present

## 2015-08-18 DIAGNOSIS — I739 Peripheral vascular disease, unspecified: Secondary | ICD-10-CM | POA: Diagnosis present

## 2015-08-18 DIAGNOSIS — I509 Heart failure, unspecified: Secondary | ICD-10-CM | POA: Diagnosis present

## 2015-08-18 DIAGNOSIS — J441 Chronic obstructive pulmonary disease with (acute) exacerbation: Secondary | ICD-10-CM | POA: Diagnosis present

## 2015-08-18 DIAGNOSIS — Z951 Presence of aortocoronary bypass graft: Secondary | ICD-10-CM

## 2015-08-18 DIAGNOSIS — E1151 Type 2 diabetes mellitus with diabetic peripheral angiopathy without gangrene: Secondary | ICD-10-CM | POA: Diagnosis present

## 2015-08-18 DIAGNOSIS — Z89611 Acquired absence of right leg above knee: Secondary | ICD-10-CM

## 2015-08-18 DIAGNOSIS — J189 Pneumonia, unspecified organism: Secondary | ICD-10-CM | POA: Diagnosis present

## 2015-08-18 DIAGNOSIS — I272 Other secondary pulmonary hypertension: Secondary | ICD-10-CM | POA: Diagnosis present

## 2015-08-18 DIAGNOSIS — E785 Hyperlipidemia, unspecified: Secondary | ICD-10-CM | POA: Diagnosis present

## 2015-08-18 DIAGNOSIS — J440 Chronic obstructive pulmonary disease with acute lower respiratory infection: Secondary | ICD-10-CM | POA: Diagnosis present

## 2015-08-18 DIAGNOSIS — I350 Nonrheumatic aortic (valve) stenosis: Secondary | ICD-10-CM | POA: Diagnosis present

## 2015-08-18 DIAGNOSIS — I4891 Unspecified atrial fibrillation: Secondary | ICD-10-CM | POA: Diagnosis present

## 2015-08-18 DIAGNOSIS — Z7901 Long term (current) use of anticoagulants: Secondary | ICD-10-CM

## 2015-08-18 DIAGNOSIS — I130 Hypertensive heart and chronic kidney disease with heart failure and stage 1 through stage 4 chronic kidney disease, or unspecified chronic kidney disease: Secondary | ICD-10-CM | POA: Diagnosis present

## 2015-08-18 DIAGNOSIS — Z833 Family history of diabetes mellitus: Secondary | ICD-10-CM

## 2015-08-18 DIAGNOSIS — Z9981 Dependence on supplemental oxygen: Secondary | ICD-10-CM

## 2015-08-18 DIAGNOSIS — J9621 Acute and chronic respiratory failure with hypoxia: Secondary | ICD-10-CM | POA: Diagnosis present

## 2015-08-18 DIAGNOSIS — Z66 Do not resuscitate: Secondary | ICD-10-CM | POA: Diagnosis present

## 2015-08-18 DIAGNOSIS — Z794 Long term (current) use of insulin: Secondary | ICD-10-CM

## 2015-08-18 DIAGNOSIS — R0602 Shortness of breath: Secondary | ICD-10-CM

## 2015-08-18 DIAGNOSIS — I5033 Acute on chronic diastolic (congestive) heart failure: Secondary | ICD-10-CM | POA: Diagnosis present

## 2015-08-18 DIAGNOSIS — I248 Other forms of acute ischemic heart disease: Secondary | ICD-10-CM | POA: Diagnosis present

## 2015-08-18 DIAGNOSIS — I2510 Atherosclerotic heart disease of native coronary artery without angina pectoris: Secondary | ICD-10-CM | POA: Diagnosis present

## 2015-08-18 DIAGNOSIS — E875 Hyperkalemia: Secondary | ICD-10-CM | POA: Diagnosis present

## 2015-08-18 DIAGNOSIS — I214 Non-ST elevation (NSTEMI) myocardial infarction: Secondary | ICD-10-CM | POA: Diagnosis present

## 2015-08-18 DIAGNOSIS — N183 Chronic kidney disease, stage 3 (moderate): Secondary | ICD-10-CM | POA: Diagnosis present

## 2015-08-18 DIAGNOSIS — N179 Acute kidney failure, unspecified: Secondary | ICD-10-CM | POA: Diagnosis present

## 2015-08-18 DIAGNOSIS — R0600 Dyspnea, unspecified: Secondary | ICD-10-CM

## 2015-08-18 DIAGNOSIS — J939 Pneumothorax, unspecified: Secondary | ICD-10-CM

## 2015-08-18 DIAGNOSIS — J90 Pleural effusion, not elsewhere classified: Secondary | ICD-10-CM

## 2015-08-18 HISTORY — DX: Unspecified atrial fibrillation: I48.91

## 2015-08-18 LAB — CBC WITH DIFFERENTIAL/PLATELET
BASOS ABS: 0 10*3/uL (ref 0–0.1)
BASOS PCT: 1 %
EOS ABS: 0 10*3/uL (ref 0–0.7)
EOS PCT: 1 %
HCT: 37.8 % (ref 35.0–47.0)
Hemoglobin: 11.8 g/dL — ABNORMAL LOW (ref 12.0–16.0)
Lymphocytes Relative: 13 %
Lymphs Abs: 0.8 10*3/uL — ABNORMAL LOW (ref 1.0–3.6)
MCH: 28.4 pg (ref 26.0–34.0)
MCHC: 31.3 g/dL — AB (ref 32.0–36.0)
MCV: 90.9 fL (ref 80.0–100.0)
MONO ABS: 0.5 10*3/uL (ref 0.2–0.9)
MONOS PCT: 8 %
Neutro Abs: 5.1 10*3/uL (ref 1.4–6.5)
Neutrophils Relative %: 79 %
PLATELETS: 150 10*3/uL (ref 150–440)
RBC: 4.15 MIL/uL (ref 3.80–5.20)
RDW: 18.4 % — AB (ref 11.5–14.5)
WBC: 6.5 10*3/uL (ref 3.6–11.0)

## 2015-08-18 LAB — BASIC METABOLIC PANEL
ANION GAP: 8 (ref 5–15)
BUN: 36 mg/dL — ABNORMAL HIGH (ref 6–20)
CALCIUM: 9.1 mg/dL (ref 8.9–10.3)
CO2: 38 mmol/L — AB (ref 22–32)
CREATININE: 1.23 mg/dL — AB (ref 0.44–1.00)
Chloride: 97 mmol/L — ABNORMAL LOW (ref 101–111)
GFR, EST AFRICAN AMERICAN: 44 mL/min — AB (ref >60)
GFR, EST NON AFRICAN AMERICAN: 38 mL/min — AB (ref >60)
Glucose, Bld: 270 mg/dL — ABNORMAL HIGH (ref 65–99)
Potassium: 4 mmol/L (ref 3.5–5.1)
SODIUM: 143 mmol/L (ref 135–145)

## 2015-08-18 LAB — GLUCOSE, CAPILLARY
GLUCOSE-CAPILLARY: 368 mg/dL — AB (ref 65–99)
Glucose-Capillary: 295 mg/dL — ABNORMAL HIGH (ref 65–99)

## 2015-08-18 LAB — BRAIN NATRIURETIC PEPTIDE: B NATRIURETIC PEPTIDE 5: 1720 pg/mL — AB (ref 0.0–100.0)

## 2015-08-18 LAB — TROPONIN I
TROPONIN I: 0.1 ng/mL — AB (ref <0.031)
TROPONIN I: 4.44 ng/mL — AB (ref <0.031)
Troponin I: 9.19 ng/mL — ABNORMAL HIGH (ref <0.031)

## 2015-08-18 LAB — MRSA PCR SCREENING: MRSA by PCR: NEGATIVE

## 2015-08-18 MED ORDER — METHYLPREDNISOLONE SODIUM SUCC 125 MG IJ SOLR
125.0000 mg | Freq: Once | INTRAMUSCULAR | Status: AC
  Administered 2015-08-18: 125 mg via INTRAVENOUS
  Filled 2015-08-18: qty 2

## 2015-08-18 MED ORDER — METHYLPREDNISOLONE SODIUM SUCC 125 MG IJ SOLR
60.0000 mg | Freq: Four times a day (QID) | INTRAMUSCULAR | Status: DC
  Administered 2015-08-18 – 2015-08-21 (×11): 60 mg via INTRAVENOUS
  Filled 2015-08-18 (×11): qty 2

## 2015-08-18 MED ORDER — TEMAZEPAM 15 MG PO CAPS
15.0000 mg | ORAL_CAPSULE | Freq: Every day | ORAL | Status: DC
  Administered 2015-08-18 – 2015-08-23 (×5): 15 mg via ORAL
  Filled 2015-08-18 (×6): qty 1

## 2015-08-18 MED ORDER — SENNOSIDES-DOCUSATE SODIUM 8.6-50 MG PO TABS
1.0000 | ORAL_TABLET | Freq: Two times a day (BID) | ORAL | Status: DC
  Administered 2015-08-18 – 2015-08-24 (×11): 1 via ORAL
  Filled 2015-08-18 (×12): qty 1

## 2015-08-18 MED ORDER — METOPROLOL TARTRATE 25 MG PO TABS
25.0000 mg | ORAL_TABLET | Freq: Three times a day (TID) | ORAL | Status: DC
  Administered 2015-08-19 – 2015-08-24 (×16): 25 mg via ORAL
  Filled 2015-08-18 (×16): qty 1

## 2015-08-18 MED ORDER — HEPARIN BOLUS VIA INFUSION
4000.0000 [IU] | Freq: Once | INTRAVENOUS | Status: AC
  Administered 2015-08-18: 4000 [IU] via INTRAVENOUS
  Filled 2015-08-18: qty 4000

## 2015-08-18 MED ORDER — DOCUSATE SODIUM 100 MG PO CAPS
100.0000 mg | ORAL_CAPSULE | Freq: Two times a day (BID) | ORAL | Status: DC
  Administered 2015-08-18 – 2015-08-24 (×12): 100 mg via ORAL
  Filled 2015-08-18 (×12): qty 1

## 2015-08-18 MED ORDER — ISOSORBIDE MONONITRATE ER 60 MG PO TB24
60.0000 mg | ORAL_TABLET | Freq: Every day | ORAL | Status: DC
  Administered 2015-08-19 – 2015-08-24 (×6): 60 mg via ORAL
  Filled 2015-08-18 (×6): qty 1

## 2015-08-18 MED ORDER — SPIRONOLACTONE 25 MG PO TABS
25.0000 mg | ORAL_TABLET | Freq: Every day | ORAL | Status: DC
  Administered 2015-08-19: 25 mg via ORAL
  Filled 2015-08-18: qty 1

## 2015-08-18 MED ORDER — AZELASTINE HCL 0.1 % NA SOLN
2.0000 | Freq: Two times a day (BID) | NASAL | Status: DC
  Administered 2015-08-18 – 2015-08-24 (×12): 2 via NASAL
  Filled 2015-08-18: qty 30

## 2015-08-18 MED ORDER — DILTIAZEM HCL 25 MG/5ML IV SOLN
10.0000 mg | Freq: Once | INTRAVENOUS | Status: AC
  Administered 2015-08-18: 10 mg via INTRAVENOUS
  Filled 2015-08-18: qty 5

## 2015-08-18 MED ORDER — ASPIRIN EC 81 MG PO TBEC
81.0000 mg | DELAYED_RELEASE_TABLET | Freq: Every day | ORAL | Status: DC
  Administered 2015-08-19 – 2015-08-24 (×6): 81 mg via ORAL
  Filled 2015-08-18 (×6): qty 1

## 2015-08-18 MED ORDER — ATORVASTATIN CALCIUM 20 MG PO TABS
20.0000 mg | ORAL_TABLET | Freq: Every day | ORAL | Status: DC
  Administered 2015-08-18 – 2015-08-23 (×6): 20 mg via ORAL
  Filled 2015-08-18 (×6): qty 1

## 2015-08-18 MED ORDER — IPRATROPIUM-ALBUTEROL 0.5-2.5 (3) MG/3ML IN SOLN
3.0000 mL | Freq: Once | RESPIRATORY_TRACT | Status: AC
  Administered 2015-08-18: 3 mL via RESPIRATORY_TRACT
  Filled 2015-08-18: qty 3

## 2015-08-18 MED ORDER — MAGNESIUM OXIDE 400 (241.3 MG) MG PO TABS
400.0000 mg | ORAL_TABLET | Freq: Every day | ORAL | Status: DC
Start: 2015-08-18 — End: 2015-08-24
  Administered 2015-08-18 – 2015-08-24 (×7): 400 mg via ORAL
  Filled 2015-08-18 (×7): qty 1

## 2015-08-18 MED ORDER — ADULT MULTIVITAMIN W/MINERALS CH
1.0000 | ORAL_TABLET | Freq: Every day | ORAL | Status: DC
  Administered 2015-08-19 – 2015-08-24 (×6): 1 via ORAL
  Filled 2015-08-18 (×6): qty 1

## 2015-08-18 MED ORDER — PANTOPRAZOLE SODIUM 40 MG PO TBEC
40.0000 mg | DELAYED_RELEASE_TABLET | Freq: Two times a day (BID) | ORAL | Status: DC
  Administered 2015-08-18 – 2015-08-24 (×12): 40 mg via ORAL
  Filled 2015-08-18 (×12): qty 1

## 2015-08-18 MED ORDER — DILTIAZEM HCL 100 MG IV SOLR
5.0000 mg/h | Freq: Once | INTRAVENOUS | Status: AC
  Administered 2015-08-18: 10 mg/h via INTRAVENOUS
  Filled 2015-08-18: qty 100

## 2015-08-18 MED ORDER — DEXTROSE 5 % IV SOLN
250.0000 mg | INTRAVENOUS | Status: DC
  Administered 2015-08-18 – 2015-08-20 (×3): 250 mg via INTRAVENOUS
  Filled 2015-08-18 (×6): qty 250

## 2015-08-18 MED ORDER — IPRATROPIUM-ALBUTEROL 0.5-2.5 (3) MG/3ML IN SOLN
3.0000 mL | RESPIRATORY_TRACT | Status: DC
  Administered 2015-08-18 – 2015-08-21 (×16): 3 mL via RESPIRATORY_TRACT
  Filled 2015-08-18 (×18): qty 3

## 2015-08-18 MED ORDER — ALLOPURINOL 100 MG PO TABS
100.0000 mg | ORAL_TABLET | Freq: Every day | ORAL | Status: DC
  Administered 2015-08-19 – 2015-08-24 (×6): 100 mg via ORAL
  Filled 2015-08-18 (×6): qty 1

## 2015-08-18 MED ORDER — INSULIN ASPART 100 UNIT/ML ~~LOC~~ SOLN
0.0000 [IU] | Freq: Three times a day (TID) | SUBCUTANEOUS | Status: DC
  Administered 2015-08-19: 7 [IU] via SUBCUTANEOUS
  Filled 2015-08-18: qty 7

## 2015-08-18 MED ORDER — FUROSEMIDE 10 MG/ML IJ SOLN
20.0000 mg | Freq: Three times a day (TID) | INTRAMUSCULAR | Status: DC
  Administered 2015-08-18 – 2015-08-19 (×4): 20 mg via INTRAVENOUS
  Filled 2015-08-18 (×5): qty 2

## 2015-08-18 MED ORDER — GABAPENTIN 300 MG PO CAPS
300.0000 mg | ORAL_CAPSULE | Freq: Three times a day (TID) | ORAL | Status: DC
  Administered 2015-08-18 – 2015-08-22 (×12): 300 mg via ORAL
  Filled 2015-08-18 (×12): qty 1

## 2015-08-18 MED ORDER — ALPRAZOLAM 0.25 MG PO TABS
0.2500 mg | ORAL_TABLET | Freq: Three times a day (TID) | ORAL | Status: DC
  Administered 2015-08-18 – 2015-08-24 (×16): 0.25 mg via ORAL
  Filled 2015-08-18 (×18): qty 1

## 2015-08-18 MED ORDER — OXYCODONE-ACETAMINOPHEN 5-325 MG PO TABS
1.0000 | ORAL_TABLET | Freq: Four times a day (QID) | ORAL | Status: DC | PRN
  Administered 2015-08-18: 2 via ORAL
  Administered 2015-08-21 – 2015-08-24 (×4): 1 via ORAL
  Filled 2015-08-18: qty 2
  Filled 2015-08-18: qty 1
  Filled 2015-08-18: qty 2
  Filled 2015-08-18 (×2): qty 1

## 2015-08-18 MED ORDER — DEXTROSE 5 % IV SOLN
5.0000 mg/h | INTRAVENOUS | Status: DC
  Administered 2015-08-18: 12.5 mg/h via INTRAVENOUS
  Administered 2015-08-19: 5 mg/h via INTRAVENOUS
  Administered 2015-08-19: 12.5 mg/h via INTRAVENOUS
  Filled 2015-08-18 (×3): qty 100

## 2015-08-18 MED ORDER — DILTIAZEM HCL 30 MG PO TABS: 30.0000 mg | ORAL_TABLET | Freq: Three times a day (TID) | ORAL | Status: DC

## 2015-08-18 MED ORDER — HEPARIN (PORCINE) IN NACL 100-0.45 UNIT/ML-% IJ SOLN
950.0000 [IU]/h | INTRAMUSCULAR | Status: DC
  Administered 2015-08-18: 1000 [IU]/h via INTRAVENOUS
  Administered 2015-08-20 – 2015-08-21 (×2): 950 [IU]/h via INTRAVENOUS
  Filled 2015-08-18 (×7): qty 250

## 2015-08-18 MED ORDER — POLYETHYLENE GLYCOL 3350 17 G PO PACK
17.0000 g | PACK | Freq: Every day | ORAL | Status: DC
  Administered 2015-08-18 – 2015-08-23 (×5): 17 g via ORAL
  Filled 2015-08-18 (×6): qty 1

## 2015-08-18 MED ORDER — FUROSEMIDE 10 MG/ML IJ SOLN
40.0000 mg | Freq: Once | INTRAMUSCULAR | Status: AC
  Administered 2015-08-18: 40 mg via INTRAVENOUS
  Filled 2015-08-18: qty 4

## 2015-08-18 MED ORDER — TIOTROPIUM BROMIDE MONOHYDRATE 18 MCG IN CAPS
18.0000 ug | ORAL_CAPSULE | Freq: Every day | RESPIRATORY_TRACT | Status: DC
  Administered 2015-08-19 – 2015-08-24 (×6): 18 ug via RESPIRATORY_TRACT
  Filled 2015-08-18: qty 5

## 2015-08-18 MED ORDER — DILTIAZEM HCL 100 MG IV SOLR
5.0000 mg/h | Freq: Once | INTRAVENOUS | Status: AC
  Administered 2015-08-18: 12.5 mg/h via INTRAVENOUS

## 2015-08-18 MED ORDER — CETYLPYRIDINIUM CHLORIDE 0.05 % MT LIQD
7.0000 mL | Freq: Two times a day (BID) | OROMUCOSAL | Status: DC
  Administered 2015-08-18 – 2015-08-19 (×3): 7 mL via OROMUCOSAL
  Filled 2015-08-18: qty 7

## 2015-08-18 MED ORDER — DEXTROSE 5 % IV SOLN
1.0000 g | INTRAVENOUS | Status: DC
  Administered 2015-08-18 – 2015-08-23 (×6): 1 g via INTRAVENOUS
  Filled 2015-08-18 (×7): qty 10

## 2015-08-18 MED ORDER — INSULIN DETEMIR 100 UNIT/ML ~~LOC~~ SOLN
35.0000 [IU] | Freq: Every day | SUBCUTANEOUS | Status: DC
  Administered 2015-08-18: 35 [IU] via SUBCUTANEOUS
  Filled 2015-08-18 (×2): qty 0.35

## 2015-08-18 MED ORDER — FERROUS SULFATE 325 (65 FE) MG PO TABS
325.0000 mg | ORAL_TABLET | Freq: Two times a day (BID) | ORAL | Status: DC
  Administered 2015-08-18 – 2015-08-24 (×12): 325 mg via ORAL
  Filled 2015-08-18 (×12): qty 1

## 2015-08-18 MED ORDER — ESCITALOPRAM OXALATE 10 MG PO TABS
10.0000 mg | ORAL_TABLET | Freq: Every day | ORAL | Status: DC
  Administered 2015-08-19 – 2015-08-24 (×6): 10 mg via ORAL
  Filled 2015-08-18 (×6): qty 1

## 2015-08-18 NOTE — ED Notes (Signed)
Pt comes into the ED via EMS from [facility] with c/o increased SOB, states she has been on abx for the past 4 days.. States she just cant get the mucous out of her throat to breath.. Noted audible wheezing on arrival.. Pt is on continous 2L [place].Marland Kitchen. Above the knee right leg amputation

## 2015-08-18 NOTE — Progress Notes (Signed)
Patient arrived to room from ED on cardizem at 10 mg/hr and heparin at 1000 units/hr. Skin verified with Loree Feeaylor RN. Telemetry verified. VS taken see flowsheet. Oriented patient to the room and about fall safety. Fall Regulatory affairs officersafety contract signed. Patient resting quietly call light in reach bed in the lowest position.

## 2015-08-18 NOTE — ED Notes (Signed)
Per family they states pts skin color looks a lot better he states "she was very pale last I saw here looks like her color has come back tremendously"

## 2015-08-18 NOTE — ED Provider Notes (Signed)
[facility] Emergency Department Provider Note     Time seen: ----------------------------------------- 10:59 AM on 08/18/2015 -----------------------------------------    I have reviewed the triage vital signs and the nursing notes.   HISTORY  Chief Complaint Shortness of Breath    HPI [name] is a 81 y.o. female who presents ER for shortness of breath. She presents from [facility] with shortness of breath and a recent diagnosis of pneumonia. Patient states she's not feeling any better, did receive breathing treatments this morning without improvement in her symptoms. She denies fevers or chills, currently taking antibiotics for pneumonia. She also has noted recent weight gain after having right lower extremity amputation   Past Medical History  Diagnosis Date  . Cancer   . Blood transfusion without reported diagnosis   . CHF (congestive heart failure)   . Coronary artery disease   . Hypertension   . Shortness of breath dyspnea   . Diabetes mellitus without complication   . Peripheral vascular disease     Patient Active Problem List   Diagnosis Date Noted  . Ischemic leg 06/05/2015  . Ischemia of lower extremity 05/26/2015  . Aortic heart valve narrowing 05/26/2015  . Arthritis urica 05/26/2015  . H/O angina pectoris 05/26/2015  . Peripheral blood vessel disorder (HCC) 05/26/2015  . Arteriosclerosis of coronary artery 01/25/2014  . Heart failure (HCC) 01/25/2014  . Chronic kidney disease (CKD), stage III (moderate) 01/25/2014  . HLD (hyperlipidemia) 01/25/2014  . BP (high blood pressure) 01/25/2014  . Arthritis, degenerative 01/25/2014  . Hypertensive pulmonary vascular disease (HCC) 01/25/2014  . Cardiac failure right (HCC) 01/25/2014  . Diabetes mellitus, type 2 (HCC) 01/25/2014  . Herpes zona 07/14/2009    Past Surgical History  Procedure Laterality Date  . Back surgery    . Coronary artery bypass graft    . Vascular  surgery    . Peripheral vascular catheterization Right 05/27/2015    Procedure: Lower Extremity Angiography;  Surgeon: Renford DillsGregory G Schnier, MD;  Location: [facility] INVASIVE CV LAB;  Service: Cardiovascular;  Laterality: Right;  . Peripheral vascular catheterization  05/27/2015    Procedure: Lower Extremity Intervention;  Surgeon: Renford DillsGregory G Schnier, MD;  Location: [facility] INVASIVE CV LAB;  Service: Cardiovascular;;  . Amputation Right 06/06/2015    Procedure: AMPUTATION ABOVE KNEE;  Surgeon: Annice NeedyJason S Dew, MD;  Location: [facility] ORS;  Service: Vascular;  Laterality: Right;    Allergies Dilaudid and Morphine and related  Social History Social History  Substance Use Topics  . Smoking status: Never Smoker   . Smokeless tobacco: Not on file  . Alcohol Use: No    Review of Systems Constitutional: Negative for fever. Eyes: Negative for visual changes. ENT: Negative for sore throat. Cardiovascular: Negative for chest pain. Respiratory: Positive for shortness of breath Gastrointestinal: Negative for abdominal pain, vomiting and diarrhea. Genitourinary: Negative for dysuria. Musculoskeletal: Negative for back pain. Positive for edema Skin: Negative for rash. Neurological: Negative for headaches, focal weakness or numbness.  10-point ROS otherwise negative.  ____________________________________________   PHYSICAL EXAM:  VITAL SIGNS: ED Triage Vitals  Enc Vitals Group     BP --      Pulse --      Resp --      Temp --      Temp src --      SpO2 --      Weight --      Height --      Head Cir --  [place] --      Pain Score --      Pain Loc --      Pain Edu? --      Excl. in GC? --     Constitutional: Alert and oriented. Obese, Mild distress Eyes: Conjunctivae are normal. PERRL. Normal extraocular movements. ENT   Head: Normocephalic and atraumatic.   Nose: No congestion/rhinnorhea.   Mouth/Throat: Mucous membranes are moist.   Neck: No stridor. Cardiovascular: Rapid  rate, irregular rhythm. Normal and symmetric distal pulses are present in all extremities. No murmurs, rubs, or gallops. Respiratory: Mild tachypnea with wheezing bilaterally. Gastrointestinal: Soft and nontender. No distention. No abdominal bruits.  Musculoskeletal: Right lower extremity amputation, mild edema in the left lower extremity Neurologic:  Normal speech and language. No gross focal neurologic deficits are appreciated. Speech is normal. No gait instability. Skin:  Skin is warm, dry and intact. No rash noted. Psychiatric: Mood and affect are normal. Speech and behavior are normal. Patient exhibits appropriate insight and judgment. ____________________________________________  EKG: Interpreted by me. Atrial fibrillation with a rate of 112 bpm, normal QS width, normal QT interval. Normal axis. Nonspecific ST depression  ____________________________________________  ED COURSE:  Pertinent labs & imaging results that were available during my care of the patient were reviewed by me and considered in my medical decision making (see chart for details). Patient mild distress, unclear etiology. She will receive breathing treatments, steroids and x-rays. ____________________________________________    LABS (pertinent positives/negatives)  Labs Reviewed  CBC WITH DIFFERENTIAL/PLATELET - Abnormal; Notable for the following:    Hemoglobin 11.8 (*)    MCHC 31.3 (*)    RDW 18.4 (*)    Lymphs Abs 0.8 (*)    All other components within normal limits  BASIC METABOLIC PANEL - Abnormal; Notable for the following:    Chloride 97 (*)    CO2 38 (*)    Glucose, Bld 270 (*)    BUN 36 (*)    Creatinine, Ser 1.23 (*)    GFR calc non Af Amer 38 (*)    GFR calc Af Amer 44 (*)    All other components within normal limits  BRAIN NATRIURETIC PEPTIDE - Abnormal; Notable for the following:    B Natriuretic Peptide 1720.0 (*)    All other components within normal limits  TROPONIN I - Abnormal; Notable  for the following:    Troponin I 0.10 (*)    All other components within normal limits   CRITICAL CARE Performed by: Emily Filbert   Total critical care time: 30 minutes  Critical care time was exclusive of separately billable procedures and treating other patients.  Critical care was necessary to treat or prevent imminent or life-threatening deterioration.  Critical care was time spent personally by me on the following activities: development of treatment plan with patient and/or surrogate as well as nursing, discussions with consultants, evaluation of patient's response to treatment, examination of patient, obtaining history from patient or surrogate, ordering and performing treatments and interventions, ordering and review of laboratory studies, ordering and review of radiographic studies, pulse oximetry and re-evaluation of patient's condition.   RADIOLOGY Images were viewed by me  Chest x-ray IMPRESSION: 1. CHF pattern. 2. Limited visualization of the bases, especially on the left, with pneumonia not excluded. ____________________________________________  FINAL ASSESSMENT AND PLAN  Dyspnea, rapid atrial fibrillation, congestive heart failure  Plan: Patient with labs and imaging as dictated above. I have started patient on a Cardizem drip, her heart  rate remains elevated after dose of IV Cardizem here. She has received additional dose of Lasix without improvement in her symptoms. She would benefit from hospitalization diuresis and heart rate management.   Emily Filbert, MD   Emily Filbert, MD 08/18/15 [id number]

## 2015-08-18 NOTE — Progress Notes (Signed)
ANTICOAGULATION CONSULT NOTE - Initial Consult  Pharmacy Consult for Heparin  Indication: atrial fibrillation  Allergies  Allergen Reactions  . Dilaudid [Hydromorphone Hcl] Nausea And Vomiting  . Morphine And Related Other (See Comments)    Reaction:  Lightheadedness  Patient's daughter says she can take morphine.    Patient Measurements: Height: 4\' 11"  (149.9 cm) Weight: 205 lb (92.987 kg) IBW/kg (Calculated) : 43.2 Heparin Dosing Weight: 65.7 kg   Vital Signs: Temp: 98.1 F (36.7 C) (12/05 1100) Temp Source: Oral (12/05 1100) BP: 116/105 mmHg (12/05 1515) Pulse Rate: 26 (12/05 1515)  Labs:  Recent Labs  08/18/15 1127  HGB 11.8*  HCT 37.8  PLT 150  CREATININE 1.23*  TROPONINI 0.10*    Estimated Creatinine Clearance: 31.5 mL/min (by C-G formula based on Cr of 1.23).   Medical History: Past Medical History  Diagnosis Date  . Cancer (HCC)   . Blood transfusion without reported diagnosis   . CHF (congestive heart failure) (HCC)   . Coronary artery disease   . Hypertension   . Shortness of breath dyspnea   . Diabetes mellitus without complication (HCC)   . Peripheral vascular disease (HCC)     Medications:   (Not in a hospital admission)  Assessment: Pharmacy consulted to dose heparin in this 81 year old female admitted with Afib.  No prior anticoag noted.  Goal of Therapy:  Heparin level 0.3-0.7 units/ml Monitor platelets by anticoagulation protocol: Yes   Plan:  Give 4000 units bolus x 1 Start heparin infusion at 1000 units/hr  Will draw 1st HL 8 hrs after start of drip on 12/6 @  Will check CBC daily.  Peighton Mehra D 08/18/2015,3:30 PM

## 2015-08-18 NOTE — Care Management (Signed)
Spoke with attending about step down order.  he says that patient is hemodynamically stable.  The only reason he wrote order for stepdown is because of the cardizem drip.  He was unaware that  2A unit was able to take cardizem drips

## 2015-08-18 NOTE — Progress Notes (Signed)
Notified Dr. Nydia Boutonalwood of troponin at 9.19. Verified order for PO cardizem and metoprolol with Dr. Elisabeth PigeonVachhani. He stated to hold PO cardizem for now and he will decrease metoprolol from 25 to 12.5. Patient stable at this time will continue to monitor.

## 2015-08-18 NOTE — H&P (Addendum)
[facility] -  at [facility]   PATIENT NAME: [name]    MR#:  [id number]  DATE OF BIRTH:  1925-04-05  DATE OF ADMISSION:  08/18/2015  PRIMARY CARE PHYSICIAN: Danella Penton., MD   REQUESTING/REFERRING PHYSICIAN: Williams  CHIEF COMPLAINT:   Chief Complaint  Patient presents with  . Shortness of Breath    HISTORY OF PRESENT ILLNESS: [name]  is a 81 y.o. female with a known history of congestive heart failure, coronary artery disease, hypertension, diabetes, peripheral arterial disease status post right below-knee amputation, COPD- lives in a rehabilitation center since she had amputation on her right lower activity September 2016. For last 1 week she is feeling more short of breath so they did a chest x-ray at nursing home and the she was started on some antibiotic and told that she had pneumonia. She gained almost 7 pound weight in last 1 week, and also noted her left leg is swollen. She continued to get worse in spite of getting nebulizer therapy and oxygen and antibiotics, up to the level that today she was feeling very short of breath and felt like the mucus is stuck in her chest and tightness in her chest so finally she told the nurse to bring her to emergency room.  In ER she is noted to have A. fib with rapid ventricular response and some pulmonary edema on chest x-ray, she was given injection Lasix and injection Cardizem once but it did not help much with her tachycardia. So she started on Cardizem IV drip and given to hospitalist team for admission. Patient denies any fever or sputum production with her cough. She is chronically on oxygen 2 L via nasal cannula at nursing home.  PAST MEDICAL HISTORY:   Past Medical History  Diagnosis Date  . Cancer (HCC)   . Blood transfusion without reported diagnosis   . CHF (congestive heart failure) (HCC)   . Coronary artery disease   . Hypertension   . Shortness of breath dyspnea   . Diabetes  mellitus without complication (HCC)   . Peripheral vascular disease (HCC)     PAST SURGICAL HISTORY:  Past Surgical History  Procedure Laterality Date  . Back surgery    . Coronary artery bypass graft    . Vascular surgery    . Peripheral vascular catheterization Right 05/27/2015    Procedure: Lower Extremity Angiography;  Surgeon: Renford Dills, MD;  Location: [facility] INVASIVE CV LAB;  Service: Cardiovascular;  Laterality: Right;  . Peripheral vascular catheterization  05/27/2015    Procedure: Lower Extremity Intervention;  Surgeon: Renford Dills, MD;  Location: [facility] INVASIVE CV LAB;  Service: Cardiovascular;;  . Amputation Right 06/06/2015    Procedure: AMPUTATION ABOVE KNEE;  Surgeon: Annice Needy, MD;  Location: [facility] ORS;  Service: Vascular;  Laterality: Right;  . Above the knee amputation      right leg    SOCIAL HISTORY:  Social History  Substance Use Topics  . Smoking status: Never Smoker   . Smokeless tobacco: Not on file  . Alcohol Use: No    FAMILY HISTORY:  Family History  Problem Relation Age of Onset  . Diabetes Mother   . Diabetes Father     DRUG ALLERGIES:  Allergies  Allergen Reactions  . Dilaudid [Hydromorphone Hcl] Nausea And Vomiting  . Morphine And Related Other (See Comments)    Reaction:  Lightheadedness  Patient's daughter says she can take morphine.    REVIEW OF SYSTEMS:  CONSTITUTIONAL: No fever, fatigue or weakness.  EYES: No blurred or double vision.  EARS, NOSE, AND THROAT: No tinnitus or ear pain.  RESPIRATORY: No cough, positive for shortness of breath, minimal wheezing , no hemoptysis.  CARDIOVASCULAR: No chest pain, orthopnea, edema.  GASTROINTESTINAL: No nausea, vomiting, diarrhea or abdominal pain.  GENITOURINARY: No dysuria, hematuria.  ENDOCRINE: No polyuria, nocturia,  HEMATOLOGY: No anemia, easy bruising or bleeding SKIN: No rash or lesion. MUSCULOSKELETAL: No joint pain or arthritis.  Swelling on left leg. NEUROLOGIC:  No tingling, numbness, weakness.  PSYCHIATRY: No anxiety or depression.   MEDICATIONS AT HOME:  Prior to Admission medications   Medication Sig Start Date End Date Taking? Authorizing Provider  acetaminophen (TYLENOL) 325 MG tablet Take 2 tablets (650 mg total) by mouth every 6 (six) hours as needed for mild pain (or Fever >/= 101). 05/31/15  Yes Kimberly A Stegmayer, PA-C  allopurinol (ZYLOPRIM) 100 MG tablet Take 100 mg by mouth daily.    Yes Historical Provider, MD  ALPRAZolam (XANAX) 0.25 MG tablet Take 1 tablet (0.25 mg total) by mouth 3 (three) times daily. 06/10/15  Yes Danella Penton, MD  aspirin EC 81 MG tablet Take 81 mg by mouth daily.   Yes Historical Provider, MD  atorvastatin (LIPITOR) 20 MG tablet Take 20 mg by mouth at bedtime.   Yes Historical Provider, MD  azelastine (ASTELIN) 0.1 % nasal spray Place 2 sprays into both nostrils 2 (two) times daily. Use in each nostril as directed   Yes Historical Provider, MD  diltiazem (CARDIZEM) 30 MG tablet Take 1 tablet (30 mg total) by mouth every 8 (eight) hours. 06/11/15  Yes Danella Penton, MD  escitalopram (LEXAPRO) 5 MG tablet Take 1 tablet (5 mg total) by mouth daily. Patient taking differently: Take 10 mg by mouth daily.  05/31/15  Yes Kimberly A Stegmayer, PA-C  ferrous sulfate 325 (65 FE) MG tablet Take 325 mg by mouth 2 (two) times daily.   Yes Historical Provider, MD  furosemide (LASIX) 20 MG tablet Take 20 mg by mouth 2 (two) times daily.   Yes Historical Provider, MD  gabapentin (NEURONTIN) 300 MG capsule Take 300 mg by mouth 3 (three) times daily.   Yes Historical Provider, MD  insulin aspart (NOVOLOG) 100 UNIT/ML injection Inject 0-15 Units into the skin 3 (three) times daily with meals. 06/11/15  Yes Danella Penton, MD  insulin detemir (LEVEMIR) 100 UNIT/ML injection Inject 0.42 mLs (42 Units total) into the skin at bedtime. Patient taking differently: Inject 57 Units into the skin daily.  06/10/15  Yes Danella Penton, MD   ipratropium-albuterol (DUONEB) 0.5-2.5 (3) MG/3ML SOLN Take 3 mLs by nebulization every 6 (six) hours as needed (for shortness of breath).   Yes Historical Provider, MD  isosorbide mononitrate (IMDUR) 60 MG 24 hr tablet Take 60 mg by mouth daily.   Yes Historical Provider, MD  magnesium oxide (MAG-OX) 400 MG tablet Take 400 mg by mouth daily.   Yes Historical Provider, MD  metoprolol tartrate (LOPRESSOR) 25 MG tablet Take 1 tablet (25 mg total) by mouth 3 (three) times daily. 06/10/15  Yes Danella Penton, MD  Multiple Vitamin (MULTIVITAMIN WITH MINERALS) TABS tablet Take 1 tablet by mouth daily.   Yes Historical Provider, MD  oxyCODONE-acetaminophen (PERCOCET/ROXICET) 5-325 MG per tablet One to two tabs every four hours as needed for pain Patient taking differently: Take 1-2 tablets by mouth every 4 (four) hours as needed for severe pain.  05/31/15  Yes Kimberly A Stegmayer, PA-C  pantoprazole (PROTONIX) 40 MG tablet Take 1 tablet (40 mg total) by mouth 2 (two) times daily. 06/10/15  Yes Danella PentonMark F Miller, MD  polyethylene glycol [facility](MIRALAX / GLYCOLAX) packet Take 17 g by mouth at bedtime.    Yes Historical Provider, MD  senna-docusate (SENOKOT-S) 8.6-50 MG tablet Take 1 tablet by mouth 2 (two) times daily.   Yes Historical Provider, MD  spironolactone (ALDACTONE) 25 MG tablet Take 1 tablet (25 mg total) by mouth daily. 06/11/15  Yes Danella PentonMark F Miller, MD  temazepam (RESTORIL) 15 MG capsule Take 1 capsule (15 mg total) by mouth at bedtime. 06/10/15  Yes Danella PentonMark F Miller, MD  docusate sodium (COLACE) 100 MG capsule Take 1 capsule (100 mg total) by mouth 2 (two) times daily. Patient not taking: Reported on 08/18/2015 05/31/15   Ranae PlumberKimberly A Stegmayer, PA-C  torsemide (DEMADEX) 10 MG tablet Take 1 tablet (10 mg total) by mouth daily. Patient not taking: Reported on 08/18/2015 06/10/15   Danella PentonMark F Miller, MD      PHYSICAL EXAMINATION:   VITAL SIGNS: Blood pressure 135/71, pulse 125, temperature 98.1 F (36.7 C), temperature  source Oral, resp. rate 22, height 4\' 11"  (1.499 m), weight 92.987 kg (205 lb), SpO2 97 %.  GENERAL:  81 y.o.-year-old patient lying in the bed with no acute distress.  EYES: Pupils equal, round, reactive to light and accommodation. No scleral icterus. Extraocular muscles intact.  HEENT: Head atraumatic, normocephalic. Oropharynx and nasopharynx clear.  NECK:  Supple, no jugular venous distention. No thyroid enlargement, no tenderness.  LUNGS: Normal breath sounds bilaterally, mild wheezing, some crepitation. Positive use of accessory muscles of respiration. Requiring supplemental oxygen via nasal cannula. CARDIOVASCULAR: S1, S2 irregular and fast. No murmurs, rubs, or gallops.  ABDOMEN: Soft, nontender, nondistended. Bowel sounds present. No organomegaly or mass.  EXTREMITIES: Positive left lower extremity edema, no cyanosis, or clubbing. Right side lower extremity amputation. NEUROLOGIC: Cranial nerves II through XII are intact. Muscle strength 5/5 in all extremities. Sensation intact. Gait not checked.  PSYCHIATRIC: The patient is alert and oriented x 3.  SKIN: No obvious rash, lesion, or ulcer.   LABORATORY PANEL:   CBC  Recent Labs Lab 08/18/15 1127  WBC 6.5  HGB 11.8*  HCT 37.8  PLT 150  MCV 90.9  MCH 28.4  MCHC 31.3*  RDW 18.4*  LYMPHSABS 0.8*  MONOABS 0.5  EOSABS 0.0  BASOSABS 0.0   ------------------------------------------------------------------------------------------------------------------  Chemistries   Recent Labs Lab 08/18/15 1127  NA 143  K 4.0  CL 97*  CO2 38*  GLUCOSE 270*  BUN 36*  CREATININE 1.23*  CALCIUM 9.1   ------------------------------------------------------------------------------------------------------------------ estimated creatinine clearance is 31.5 mL/min (by C-G formula based on Cr of 1.23). ------------------------------------------------------------------------------------------------------------------ No results for  input(s): TSH, T4TOTAL, T3FREE, THYROIDAB in the last 72 hours.  Invalid input(s): FREET3   Coagulation profile No results for input(s): INR, PROTIME in the last 168 hours. ------------------------------------------------------------------------------------------------------------------- No results for input(s): DDIMER in the last 72 hours. -------------------------------------------------------------------------------------------------------------------  Cardiac Enzymes  Recent Labs Lab 08/18/15 1127  TROPONINI 0.10*   ------------------------------------------------------------------------------------------------------------------ Invalid input(s): POCBNP  ---------------------------------------------------------------------------------------------------------------  Urinalysis No results found for: COLORURINE, APPEARANCEUR, LABSPEC, PHURINE, GLUCOSEU, HGBUR, BILIRUBINUR, KETONESUR, PROTEINUR, UROBILINOGEN, NITRITE, LEUKOCYTESUR   RADIOLOGY: Dg Chest 1 View  08/18/2015  CLINICAL DATA:  Pneumonia.  Treatment for 4-5 days EXAM: CHEST 1 VIEW COMPARISON:  06/06/2015 FINDINGS: Chronic cardiopericardial enlargement. The patient is status post CABG. Stable aortic and hilar contours. Limited evaluation of the  lower chest due to underpenetration. There is interstitial coarsening which is new, with small pleural effusion at least on the right. IMPRESSION: 1. CHF pattern. 2. Limited visualization of the bases, especially on the left, with pneumonia not excluded. Electronically Signed   By: Marnee Spring M.D.   On: 08/18/2015 11:28    EKG:  A. fib with ventricular rate of 10/03/1926 and irregular ventricular rhythm.  IMPRESSION AND PLAN:  * A. fib with RVR  Was started on Cardizem IV drip and monitor in stepdown unit.  Patient is on Cardizem and metoprolol at home.  Follow serial troponin, echocardiogram, cardiogenic consult.  Started on heparin IV drip.  * Acute CHF  I do not  have previous records of her ejection fraction on echocardiogram.  We will give IV Lasix and get an echocardiogram.  Consult her primary cardiologist Dr. Lady Gary.  * Possible pneumonia  She was being treated with some antibiotic at nursing home for last 4 days.  I will give her IV Rocephin as well as azithromycin here.  * COPD exacerbation  Patient have active wheezing and shortness of breath.  IV Solu-Medrol, DuoNeb nebulizer, Spiriva. Antibiotics as mentioned above.  * Elevated troponin   Likely this is demand ischemia secondary to A. fib and CHF  Monitor serial troponin, she is already on heparin IV drip now.  * Diabetes  Insulin sliding scale coverage after fingersticks.  All the records are reviewed and case discussed with ED provider. Management plans discussed with the patient, family and they are in agreement.  CODE STATUS:DO NOT RESUSCITATE    TOTAL TIME TAKING CARE OF THIS PATIENT: 50 critical care minutes.    Altamese Dilling M.D on 08/18/2015   Between 7am to 6pm - Pager - [phone]  After 6pm go to [email or web address] - password [email or web address] [facility]  [facility] [place] [facility] [facility]  Office  [phone]  CC: Primary care physician; Danella Penton., MD   Note: This dictation was prepared with Dragon dictation along with smaller phrase technology. Any transcriptional errors that result from this process are unintentional.

## 2015-08-18 NOTE — Care Management (Addendum)
Patient presents to ED from [facility].  She became short of breath and felt like her chest was pounding.  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  She has not plans to return to [facility] as her children "gave up her apartment."  Patient says that she can not even get to the bathroom by herself so would not be able to live independently at [facility].  She full expects to return to [facility] at discharge.  She has been found to be in  A fib with RVR.  To be admitted and started on Cardizem drip.  Also some evidence of heart failure and exac of copd. Notified registration that address on face sheet is incorrect. Referral to CSW

## 2015-08-18 NOTE — Progress Notes (Addendum)
Troponin 9.19 dr. Elisabeth PigeonVachhani notified. Patient stable will continue to monitor

## 2015-08-18 NOTE — ED Notes (Signed)
Assumed care of pt at this time, pt appears SOB and has orders for ned treatment

## 2015-08-18 NOTE — Progress Notes (Signed)
Reported troponin high. Pt  Is hemodynamically stable.  Assesssment and plan.  NSTEMI    On heparin drip.   Betablocker.   Aspirin.   I spoke to Dr. Clayborn Bigness- he will schedule for cath likely tomorrow.   I called her daughter - whom i met in ER earlier ., let her know about this findings and plan.  Additional critical care time spent- 30 min.

## 2015-08-19 ENCOUNTER — Inpatient Hospital Stay
Admit: 2015-08-19 | Discharge: 2015-08-19 | Disposition: A | Discharge status: Home / Self Care | Payer: Medicare Other | Attending: Internal Medicine | Admitting: Internal Medicine

## 2015-08-19 ENCOUNTER — Inpatient Hospital Stay: Payer: Medicare Other

## 2015-08-19 LAB — GLUCOSE, CAPILLARY
GLUCOSE-CAPILLARY: 341 mg/dL — AB (ref 65–99)
GLUCOSE-CAPILLARY: 335 mg/dL — AB (ref 65–99)
GLUCOSE-CAPILLARY: 256 mg/dL — AB (ref 65–99)
Glucose-Capillary: 207 mg/dL — ABNORMAL HIGH (ref 65–99)
Glucose-Capillary: 199 mg/dL — ABNORMAL HIGH (ref 65–99)

## 2015-08-19 LAB — TROPONIN I: TROPONIN I: 18.09 ng/mL — AB (ref <0.031)

## 2015-08-19 LAB — CBC
HEMATOCRIT: 36 % (ref 35.0–47.0)
HEMOGLOBIN: 11.2 g/dL — AB (ref 12.0–16.0)
MCH: 28.5 pg (ref 26.0–34.0)
MCHC: 31.3 g/dL — ABNORMAL LOW (ref 32.0–36.0)
MCV: 91.3 fL (ref 80.0–100.0)
Platelets: 158 10*3/uL (ref 150–440)
RBC: 3.94 MIL/uL (ref 3.80–5.20)
RDW: 18.2 % — AB (ref 11.5–14.5)
WBC: 5.3 10*3/uL (ref 3.6–11.0)

## 2015-08-19 LAB — BASIC METABOLIC PANEL
ANION GAP: 9 (ref 5–15)
BUN: 40 mg/dL — ABNORMAL HIGH (ref 6–20)
CALCIUM: 8.6 mg/dL — AB (ref 8.9–10.3)
CO2: 34 mmol/L — ABNORMAL HIGH (ref 22–32)
Chloride: 94 mmol/L — ABNORMAL LOW (ref 101–111)
Creatinine, Ser: 1.69 mg/dL — ABNORMAL HIGH (ref 0.44–1.00)
GFR, EST AFRICAN AMERICAN: 30 mL/min — AB (ref >60)
GFR, EST NON AFRICAN AMERICAN: 26 mL/min — AB (ref >60)
Glucose, Bld: 331 mg/dL — ABNORMAL HIGH (ref 65–99)
POTASSIUM: 5 mmol/L (ref 3.5–5.1)
Sodium: 137 mmol/L (ref 135–145)

## 2015-08-19 LAB — HEPARIN LEVEL (UNFRACTIONATED)
HEPARIN UNFRACTIONATED: 0.56 [IU]/mL (ref 0.30–0.70)
HEPARIN UNFRACTIONATED: 0.68 [IU]/mL (ref 0.30–0.70)
HEPARIN UNFRACTIONATED: 0.71 [IU]/mL — AB (ref 0.30–0.70)

## 2015-08-19 LAB — HEMOGLOBIN A1C: HEMOGLOBIN A1C: 6.6 % — AB (ref 4.0–6.0)

## 2015-08-19 MED ORDER — SODIUM CHLORIDE 0.9 % IJ SOLN
3.0000 mL | Freq: Two times a day (BID) | INTRAMUSCULAR | Status: DC
  Administered 2015-08-19 – 2015-08-20 (×4): 3 mL via INTRAVENOUS

## 2015-08-19 MED ORDER — SODIUM CHLORIDE 0.9 % IJ SOLN
3.0000 mL | INTRAMUSCULAR | Status: DC | PRN
  Administered 2015-08-19 – 2015-08-24 (×5): 3 mL via INTRAVENOUS
  Filled 2015-08-19 (×5): qty 10

## 2015-08-19 MED ORDER — DILTIAZEM HCL 30 MG PO TABS
30.0000 mg | ORAL_TABLET | Freq: Four times a day (QID) | ORAL | Status: DC
  Administered 2015-08-19 – 2015-08-22 (×10): 30 mg via ORAL
  Filled 2015-08-19 (×11): qty 1

## 2015-08-19 MED ORDER — INSULIN ASPART 100 UNIT/ML ~~LOC~~ SOLN
0.0000 [IU] | Freq: Three times a day (TID) | SUBCUTANEOUS | Status: DC
  Administered 2015-08-19: 11 [IU] via SUBCUTANEOUS
  Administered 2015-08-19: 8 [IU] via SUBCUTANEOUS
  Administered 2015-08-20: 11 [IU] via SUBCUTANEOUS
  Administered 2015-08-20: 8 [IU] via SUBCUTANEOUS
  Administered 2015-08-20: 5 [IU] via SUBCUTANEOUS
  Administered 2015-08-21: 3 [IU] via SUBCUTANEOUS
  Administered 2015-08-21 (×2): 5 [IU] via SUBCUTANEOUS
  Administered 2015-08-22: 3 [IU] via SUBCUTANEOUS
  Administered 2015-08-22: 5 [IU] via SUBCUTANEOUS
  Administered 2015-08-22: 3 [IU] via SUBCUTANEOUS
  Administered 2015-08-23: 5 [IU] via SUBCUTANEOUS
  Administered 2015-08-23 (×2): 3 [IU] via SUBCUTANEOUS
  Filled 2015-08-19: qty 5
  Filled 2015-08-19: qty 3
  Filled 2015-08-19: qty 8
  Filled 2015-08-19: qty 3
  Filled 2015-08-19: qty 5
  Filled 2015-08-19: qty 3
  Filled 2015-08-19 (×3): qty 5
  Filled 2015-08-19: qty 11
  Filled 2015-08-19: qty 3
  Filled 2015-08-19: qty 10
  Filled 2015-08-19: qty 3

## 2015-08-19 MED ORDER — INSULIN ASPART 100 UNIT/ML ~~LOC~~ SOLN
0.0000 [IU] | Freq: Every day | SUBCUTANEOUS | Status: DC
  Administered 2015-08-19: 5 [IU] via SUBCUTANEOUS
  Filled 2015-08-19: qty 5

## 2015-08-19 MED ORDER — INSULIN ASPART 100 UNIT/ML ~~LOC~~ SOLN
0.0000 [IU] | Freq: Every day | SUBCUTANEOUS | Status: DC
  Administered 2015-08-20: 2 [IU] via SUBCUTANEOUS
  Administered 2015-08-22: 100 [IU] via SUBCUTANEOUS
  Administered 2015-08-23: 2 [IU] via SUBCUTANEOUS
  Filled 2015-08-19 (×4): qty 2

## 2015-08-19 MED ORDER — INSULIN DETEMIR 100 UNIT/ML ~~LOC~~ SOLN
42.0000 [IU] | Freq: Every day | SUBCUTANEOUS | Status: DC
  Administered 2015-08-19 – 2015-08-23 (×5): 42 [IU] via SUBCUTANEOUS
  Filled 2015-08-19 (×6): qty 0.42

## 2015-08-19 NOTE — Progress Notes (Signed)
This RN traveled with patient to xray (cardizem drip). Patient back in room resting quietly. No complaints at this time.

## 2015-08-19 NOTE — Progress Notes (Signed)
ANTICOAGULATION CONSULT NOTE - Follow up Consult  Pharmacy Consult for Heparin  Indication: atrial fibrillation   Allergies  Allergen Reactions  . Dilaudid [Hydromorphone Hcl] Nausea And Vomiting  . Morphine And Related Other (See Comments)    Reaction:  Lightheadedness  Patient's daughter says she can take morphine.    Patient Measurements: Height: 4\' 11"  (149.9 cm) Weight: 207 lb 4.8 oz (94.031 kg) IBW/kg (Calculated) : 43.2 Heparin Dosing Weight: 65.7 kg   Vital Signs: Temp: 97.5 F (36.4 C) (12/06 0500) Temp Source: Oral (12/06 0500) BP: 119/62 mmHg (12/06 1051) Pulse Rate: 62 (12/06 1051)  Labs:  Recent Labs  08/18/15 1127 08/18/15 1608 08/18/15 1750 08/19/15 0001 08/19/15 0813  HGB 11.8*  --   --   --  11.2*  HCT 37.8  --   --   --  36.0  PLT 150  --   --   --  158  HEPARINUNFRC  --   --   --  0.56 0.68  CREATININE 1.23*  --   --   --   --   TROPONINI 0.10* 4.44* 9.19* 18.09*  --     Estimated Creatinine Clearance: 31.7 mL/min (by C-G formula based on Cr of 1.23).   Medical History: Past Medical History  Diagnosis Date  . Cancer (HCC)   . Blood transfusion without reported diagnosis   . CHF (congestive heart failure) (HCC)   . Coronary artery disease   . Hypertension   . Shortness of breath dyspnea   . Diabetes mellitus without complication (HCC)   . Peripheral vascular disease (HCC)     Medications:  Prescriptions prior to admission  Medication Sig Dispense Refill Last Dose  . acetaminophen (TYLENOL) 325 MG tablet Take 2 tablets (650 mg total) by mouth every 6 (six) hours as needed for mild pain (or Fever >/= 101).   PRN at PRN  . allopurinol (ZYLOPRIM) 100 MG tablet Take 100 mg by mouth daily.    08/18/2015 at unknown  . ALPRAZolam (XANAX) 0.25 MG tablet Take 1 tablet (0.25 mg total) by mouth 3 (three) times daily. 30 tablet 0 08/18/2015 at unknown  . aspirin EC 81 MG tablet Take 81 mg by mouth daily.   08/18/2015 at Unknown time  . atorvastatin  (LIPITOR) 20 MG tablet Take 20 mg by mouth at bedtime.   08/17/2015 at unknown  . azelastine (ASTELIN) 0.1 % nasal spray Place 2 sprays into both nostrils 2 (two) times daily. Use in each nostril as directed   08/18/2015 at unknown  . diltiazem (CARDIZEM) 30 MG tablet Take 1 tablet (30 mg total) by mouth every 8 (eight) hours.   08/18/2015 at Unknown time  . escitalopram (LEXAPRO) 5 MG tablet Take 1 tablet (5 mg total) by mouth daily. (Patient taking differently: Take 10 mg by mouth daily. )   08/18/2015 at unknown  . ferrous sulfate 325 (65 FE) MG tablet Take 325 mg by mouth 2 (two) times daily.   08/17/2015 at unknown  . furosemide (LASIX) 20 MG tablet Take 20 mg by mouth 2 (two) times daily.   08/18/2015 at unknown  . gabapentin (NEURONTIN) 300 MG capsule Take 300 mg by mouth 3 (three) times daily.   08/18/2015 at unknown  . insulin aspart (NOVOLOG) 100 UNIT/ML injection Inject 0-15 Units into the skin 3 (three) times daily with meals. 10 mL 11 08/18/2015 at unknown  . insulin detemir (LEVEMIR) 100 UNIT/ML injection Inject 0.42 mLs (42 Units total) into the skin  at bedtime. (Patient taking differently: Inject 57 Units into the skin daily. ) 10 mL 11 08/18/2015 at unknown  . ipratropium-albuterol (DUONEB) 0.5-2.5 (3) MG/3ML SOLN Take 3 mLs by nebulization every 6 (six) hours as needed (for shortness of breath).   PRN at PRN  . isosorbide mononitrate (IMDUR) 60 MG 24 hr tablet Take 60 mg by mouth daily.   08/18/2015 at unknown  . magnesium oxide (MAG-OX) 400 MG tablet Take 400 mg by mouth daily.   08/17/2015 at unknown  . metoprolol tartrate (LOPRESSOR) 25 MG tablet Take 1 tablet (25 mg total) by mouth 3 (three) times daily. 1 tablet 1 08/18/2015 at unknown  . Multiple Vitamin (MULTIVITAMIN WITH MINERALS) TABS tablet Take 1 tablet by mouth daily.   08/17/2015 at unknown  . oxyCODONE-acetaminophen (PERCOCET/ROXICET) 5-325 MG per tablet One to two tabs every four hours as needed for pain (Patient taking differently:  Take 1-2 tablets by mouth every 4 (four) hours as needed for severe pain. ) 30 tablet 0 PRN at PRN  . pantoprazole (PROTONIX) 40 MG tablet Take 1 tablet (40 mg total) by mouth 2 (two) times daily. 1 tablet 1 08/18/2015 at unknown  . polyethylene glycol (MIRALAX / GLYCOLAX) packet Take 17 g by mouth at bedtime.    08/17/2015 at unknown  . senna-docusate (SENOKOT-S) 8.6-50 MG tablet Take 1 tablet by mouth 2 (two) times daily.   08/18/2015 at unknown  . spironolactone (ALDACTONE) 25 MG tablet Take 1 tablet (25 mg total) by mouth daily. 1 tablet 1 08/18/2015 at unknown  . temazepam (RESTORIL) 15 MG capsule Take 1 capsule (15 mg total) by mouth at bedtime. 30 capsule 0 08/17/2015 at unknown  . docusate sodium (COLACE) 100 MG capsule Take 1 capsule (100 mg total) by mouth 2 (two) times daily. (Patient not taking: Reported on 08/18/2015) 10 capsule 0 unknown at unknown  . torsemide (DEMADEX) 10 MG tablet Take 1 tablet (10 mg total) by mouth daily. (Patient not taking: Reported on 08/18/2015) 1 tablet 1     Assessment: Pharmacy consulted to dose heparin in this 81 year old female admitted with Afib with RVR now also with NSTEMI (elevated troponins).  No prior anticoag noted.   12/6 12mn Heparin Level resulted at 0.56. 12/6 0813 heparin level 0.68   Goal of Therapy:  Heparin level 0.3-0.7 units/ml Monitor platelets by anticoagulation protocol: Yes   Plan:  Will continue Heparin drip at current rate of 1000 units/hr. Will recheck a confirmatory Heparin level at 1700 as level is therapeutic but borderline for being high. Hgb and plt count relatively stable.  Will check CBC daily.  Pharmacy will continue to follow.   Crist Fat, PharmD, BCPS Clinical Pharmacist 08/19/2015 11:13 AM

## 2015-08-19 NOTE — Progress Notes (Signed)
Pharmacy Consult for Heparin  Indication: atrial fibrillation  Allergies  Allergen Reactions  . Dilaudid [Hydromorphone Hcl] Nausea And Vomiting  . Morphine And Related Other (See Comments)    Reaction:  Lightheadedness  Patient's daughter says she can take morphine.    Patient Measurements: Height: 4\' 11"  (149.9 cm) Weight: 207 lb 4.8 oz (94.031 kg) IBW/kg (Calculated) : 43.2 Heparin Dosing Weight: 65.7 kg   Vital Signs: Temp: 97.5 F (36.4 C) (12/06 1117) Temp Source: Oral (12/06 1117) BP: 114/60 mmHg (12/06 1117) Pulse Rate: 62 (12/06 1117)  Labs:  Recent Labs  08/18/15 1127 08/18/15 1608 08/18/15 1750 08/19/15 0001 08/19/15 0813  HGB 11.8*  --   --   --  11.2*  HCT 37.8  --   --   --  36.0  PLT 150  --   --   --  158  HEPARINUNFRC  --   --   --  0.56 0.68  CREATININE 1.23*  --   --   --   --   TROPONINI 0.10* 4.44* 9.19* 18.09*  --     Estimated Creatinine Clearance: 31.7 mL/min (by C-G formula based on Cr of 1.23).   Medical History: Past Medical History  Diagnosis Date  . Cancer (HCC)   . Blood transfusion without reported diagnosis   . CHF (congestive heart failure) (HCC)   . Coronary artery disease   . Hypertension   . Shortness of breath dyspnea   . Diabetes mellitus without complication (HCC)   . Peripheral vascular disease (HCC)     Medications:  Prescriptions prior to admission  Medication Sig Dispense Refill Last Dose  . acetaminophen (TYLENOL) 325 MG tablet Take 2 tablets (650 mg total) by mouth every 6 (six) hours as needed for mild pain (or Fever >/= 101).   PRN at PRN  . allopurinol (ZYLOPRIM) 100 MG tablet Take 100 mg by mouth daily.    08/18/2015 at unknown  . ALPRAZolam (XANAX) 0.25 MG tablet Take 1 tablet (0.25 mg total) by mouth 3 (three) times daily. 30 tablet 0 08/18/2015 at unknown  . aspirin EC 81 MG tablet Take 81 mg by mouth daily.   08/18/2015 at Unknown time  . atorvastatin (LIPITOR) 20 MG tablet Take 20 mg by mouth at  bedtime.   08/17/2015 at unknown  . azelastine (ASTELIN) 0.1 % nasal spray Place 2 sprays into both nostrils 2 (two) times daily. Use in each nostril as directed   08/18/2015 at unknown  . diltiazem (CARDIZEM) 30 MG tablet Take 1 tablet (30 mg total) by mouth every 8 (eight) hours.   08/18/2015 at Unknown time  . escitalopram (LEXAPRO) 5 MG tablet Take 1 tablet (5 mg total) by mouth daily. (Patient taking differently: Take 10 mg by mouth daily. )   08/18/2015 at unknown  . ferrous sulfate 325 (65 FE) MG tablet Take 325 mg by mouth 2 (two) times daily.   08/17/2015 at unknown  . furosemide (LASIX) 20 MG tablet Take 20 mg by mouth 2 (two) times daily.   08/18/2015 at unknown  . gabapentin (NEURONTIN) 300 MG capsule Take 300 mg by mouth 3 (three) times daily.   08/18/2015 at unknown  . insulin aspart (NOVOLOG) 100 UNIT/ML injection Inject 0-15 Units into the skin 3 (three) times daily with meals. 10 mL 11 08/18/2015 at unknown  . insulin detemir (LEVEMIR) 100 UNIT/ML injection Inject 0.42 mLs (42 Units total) into the skin at bedtime. (Patient taking differently: Inject 57 Units into  the skin daily. ) 10 mL 11 08/18/2015 at unknown  . ipratropium-albuterol (DUONEB) 0.5-2.5 (3) MG/3ML SOLN Take 3 mLs by nebulization every 6 (six) hours as needed (for shortness of breath).   PRN at PRN  . isosorbide mononitrate (IMDUR) 60 MG 24 hr tablet Take 60 mg by mouth daily.   08/18/2015 at unknown  . magnesium oxide (MAG-OX) 400 MG tablet Take 400 mg by mouth daily.   08/17/2015 at unknown  . metoprolol tartrate (LOPRESSOR) 25 MG tablet Take 1 tablet (25 mg total) by mouth 3 (three) times daily. 1 tablet 1 08/18/2015 at unknown  . Multiple Vitamin (MULTIVITAMIN WITH MINERALS) TABS tablet Take 1 tablet by mouth daily.   08/17/2015 at unknown  . oxyCODONE-acetaminophen (PERCOCET/ROXICET) 5-325 MG per tablet One to two tabs every four hours as needed for pain (Patient taking differently: Take 1-2 tablets by mouth every 4 (four)  hours as needed for severe pain. ) 30 tablet 0 PRN at PRN  . pantoprazole (PROTONIX) 40 MG tablet Take 1 tablet (40 mg total) by mouth 2 (two) times daily. 1 tablet 1 08/18/2015 at unknown  . polyethylene glycol (MIRALAX / GLYCOLAX) packet Take 17 g by mouth at bedtime.    08/17/2015 at unknown  . senna-docusate (SENOKOT-S) 8.6-50 MG tablet Take 1 tablet by mouth 2 (two) times daily.   08/18/2015 at unknown  . spironolactone (ALDACTONE) 25 MG tablet Take 1 tablet (25 mg total) by mouth daily. 1 tablet 1 08/18/2015 at unknown  . temazepam (RESTORIL) 15 MG capsule Take 1 capsule (15 mg total) by mouth at bedtime. 30 capsule 0 08/17/2015 at unknown  . docusate sodium (COLACE) 100 MG capsule Take 1 capsule (100 mg total) by mouth 2 (two) times daily. (Patient not taking: Reported on 08/18/2015) 10 capsule 0 unknown at unknown  . torsemide (DEMADEX) 10 MG tablet Take 1 tablet (10 mg total) by mouth daily. (Patient not taking: Reported on 08/18/2015) 1 tablet 1     Assessment: Pharmacy consulted to dose heparin in this 81 year old female admitted with Afib.  No prior anticoag noted.   HL= 0.68  Goal of Therapy:  Heparin level 0.3-0.7 units/ml Monitor platelets by anticoagulation protocol: Yes   Plan:  Will continue Heparin drip at 1000 units/hr. Will check another HL with AM labs. Will check CBC daily.  Luisa Hart, PharmD   08/19/2015 11:24 AM

## 2015-08-19 NOTE — Progress Notes (Signed)
Attempted to place a foley per patient request and physician order. Unable to put foley catheter in and patient was unable to withstand laying flat for a second attempt. Will continue to monitor closely for bathroom needs.

## 2015-08-19 NOTE — Progress Notes (Signed)
Inpatient Diabetes Program Recommendations  AACE/ADA: New Consensus Statement on Inpatient Glycemic Control (2015)  Target Ranges:  Prepandial:   less than 140 mg/dL      Peak postprandial:   less than 180 mg/dL (1-2 hours)      Critically ill patients:  140 - 180 mg/dL    Results for Evelena AsaDAVIS, Keyasha H (MRN [id number]) as of 08/19/2015 11:18  Ref. Range 08/18/2015 16:09 08/18/2015 22:44  Glucose-Capillary Latest Ref Range: 65-99 mg/dL 409295 (H) 811368 (H)    Results for Evelena AsaDAVIS, Alisyn H (MRN [id number]) as of 08/19/2015 11:18  Ref. Range 08/19/2015 07:35  Glucose-Capillary Latest Ref Range: 65-99 mg/dL 213341 (H)    Admit with: SOB/ A Fib with RVR  History: DM, CHF, COPD  Home DM Meds: Levemir 57 units daily       Novolog SSI  Current Insulin Orders: Levemir 35 units QHS      Novolog Sensitive SSI (0-9 units) TID AC + HS    -Note patient currently getting IV Solumedrol 60 mg Q6 hours (was given 125 mg IV Solumedrol X 1 dose yesterday at 11am).  -Expect glucose levels to stay elevated while patient getting steroids.     MD- Please consider the following in-hospital insulin adjustments while patient getting IV steroids:  1. Increase Levemir to 57 units QHS (home dose)  2. Increase Novolog SSI to Moderate scale (0-15 units) TID AC + HS      --Will follow patient during hospitalization--  Ambrose FinlandJeannine Johnston Haniah Penny RN, MSN, CDE Diabetes Coordinator Inpatient Glycemic Control Team Team Pager: [phone] (8a-5p)

## 2015-08-19 NOTE — Progress Notes (Signed)
Notified Dr. Juliann Paresallwood that patients Heart rate is sustaining in the 60's she is on a cardizem drip at 12.5. Orders received to decrease drip to 5. Also clarified order for when patient should receive cardizem PO. Orders received to increase frequency to Q6H and give dose now.

## 2015-08-19 NOTE — Progress Notes (Signed)
Notified Dr. Clint GuyHower of patient's progress with cardizem drip, hold at 12.5. POCT is 349, MD put in orders for coverage.

## 2015-08-19 NOTE — Progress Notes (Addendum)
Pharmacy Consult for Heparin  Indication: atrial fibrillation  Allergies  Allergen Reactions  . Dilaudid [Hydromorphone Hcl] Nausea And Vomiting  . Morphine And Related Other (See Comments)    Reaction:  Lightheadedness  Patient's daughter says she can take morphine.    Patient Measurements: Height: 4\' 11"  (149.9 cm) Weight: 207 lb 4.8 oz (94.031 kg) IBW/kg (Calculated) : 43.2 Heparin Dosing Weight: 65.7 kg   Vital Signs: Temp: 98 F (36.7 C) (12/06 1609) Temp Source: Oral (12/06 1609) BP: 128/83 mmHg (12/06 1609) Pulse Rate: 76 (12/06 1609)  Labs:  Recent Labs  08/18/15 1127 08/18/15 1608 08/18/15 1750 08/19/15 0001 08/19/15 0813 08/19/15 1846  HGB 11.8*  --   --   --  11.2*  --   HCT 37.8  --   --   --  36.0  --   PLT 150  --   --   --  158  --   HEPARINUNFRC  --   --   --  0.56 0.68 0.71*  CREATININE 1.23*  --   --   --  1.69*  --   TROPONINI 0.10* 4.44* 9.19* 18.09*  --   --     Estimated Creatinine Clearance: 23.1 mL/min (by C-G formula based on Cr of 1.69).   Medical History: Past Medical History  Diagnosis Date  . Cancer (HCC)   . Blood transfusion without reported diagnosis   . CHF (congestive heart failure) (HCC)   . Coronary artery disease   . Hypertension   . Shortness of breath dyspnea   . Diabetes mellitus without complication (HCC)   . Peripheral vascular disease (HCC)     Medications:  Prescriptions prior to admission  Medication Sig Dispense Refill Last Dose  . acetaminophen (TYLENOL) 325 MG tablet Take 2 tablets (650 mg total) by mouth every 6 (six) hours as needed for mild pain (or Fever >/= 101).   PRN at PRN  . allopurinol (ZYLOPRIM) 100 MG tablet Take 100 mg by mouth daily.    08/18/2015 at unknown  . ALPRAZolam (XANAX) 0.25 MG tablet Take 1 tablet (0.25 mg total) by mouth 3 (three) times daily. 30 tablet 0 08/18/2015 at unknown  . aspirin EC 81 MG tablet Take 81 mg by mouth daily.   08/18/2015 at Unknown time  . atorvastatin  (LIPITOR) 20 MG tablet Take 20 mg by mouth at bedtime.   08/17/2015 at unknown  . azelastine (ASTELIN) 0.1 % nasal spray Place 2 sprays into both nostrils 2 (two) times daily. Use in each nostril as directed   08/18/2015 at unknown  . diltiazem (CARDIZEM) 30 MG tablet Take 1 tablet (30 mg total) by mouth every 8 (eight) hours.   08/18/2015 at Unknown time  . escitalopram (LEXAPRO) 5 MG tablet Take 1 tablet (5 mg total) by mouth daily. (Patient taking differently: Take 10 mg by mouth daily. )   08/18/2015 at unknown  . ferrous sulfate 325 (65 FE) MG tablet Take 325 mg by mouth 2 (two) times daily.   08/17/2015 at unknown  . furosemide (LASIX) 20 MG tablet Take 20 mg by mouth 2 (two) times daily.   08/18/2015 at unknown  . gabapentin (NEURONTIN) 300 MG capsule Take 300 mg by mouth 3 (three) times daily.   08/18/2015 at unknown  . insulin aspart (NOVOLOG) 100 UNIT/ML injection Inject 0-15 Units into the skin 3 (three) times daily with meals. 10 mL 11 08/18/2015 at unknown  . insulin detemir (LEVEMIR) 100 UNIT/ML injection Inject 0.42  mLs (42 Units total) into the skin at bedtime. (Patient taking differently: Inject 57 Units into the skin daily. ) 10 mL 11 08/18/2015 at unknown  . ipratropium-albuterol (DUONEB) 0.5-2.5 (3) MG/3ML SOLN Take 3 mLs by nebulization every 6 (six) hours as needed (for shortness of breath).   PRN at PRN  . isosorbide mononitrate (IMDUR) 60 MG 24 hr tablet Take 60 mg by mouth daily.   08/18/2015 at unknown  . magnesium oxide (MAG-OX) 400 MG tablet Take 400 mg by mouth daily.   08/17/2015 at unknown  . metoprolol tartrate (LOPRESSOR) 25 MG tablet Take 1 tablet (25 mg total) by mouth 3 (three) times daily. 1 tablet 1 08/18/2015 at unknown  . Multiple Vitamin (MULTIVITAMIN WITH MINERALS) TABS tablet Take 1 tablet by mouth daily.   08/17/2015 at unknown  . oxyCODONE-acetaminophen (PERCOCET/ROXICET) 5-325 MG per tablet One to two tabs every four hours as needed for pain (Patient taking differently:  Take 1-2 tablets by mouth every 4 (four) hours as needed for severe pain. ) 30 tablet 0 PRN at PRN  . pantoprazole (PROTONIX) 40 MG tablet Take 1 tablet (40 mg total) by mouth 2 (two) times daily. 1 tablet 1 08/18/2015 at unknown  . polyethylene glycol (MIRALAX / GLYCOLAX) packet Take 17 g by mouth at bedtime.    08/17/2015 at unknown  . senna-docusate (SENOKOT-S) 8.6-50 MG tablet Take 1 tablet by mouth 2 (two) times daily.   08/18/2015 at unknown  . spironolactone (ALDACTONE) 25 MG tablet Take 1 tablet (25 mg total) by mouth daily. 1 tablet 1 08/18/2015 at unknown  . temazepam (RESTORIL) 15 MG capsule Take 1 capsule (15 mg total) by mouth at bedtime. 30 capsule 0 08/17/2015 at unknown  . docusate sodium (COLACE) 100 MG capsule Take 1 capsule (100 mg total) by mouth 2 (two) times daily. (Patient not taking: Reported on 08/18/2015) 10 capsule 0 unknown at unknown  . torsemide (DEMADEX) 10 MG tablet Take 1 tablet (10 mg total) by mouth daily. (Patient not taking: Reported on 08/18/2015) 1 tablet 1     Assessment: Pharmacy consulted to dose heparin in this 81 year old female admitted with Afib.  No prior anticoag noted.   HL= 0.68  Goal of Therapy:  Heparin level 0.3-0.7 units/ml Monitor platelets by anticoagulation protocol: Yes   Plan:  Will continue Heparin drip at 1000 units/hr. Will check another HL with AM labs. Will check CBC daily. 12/06:  HL @ 17:00 = 0.71 Will decrease heparin gtt to 950 units/hr and recheck HL 8 hr after rate change on 12/7 @ 04:00.   Denisse Whitenack D  08/19/2015 8:10 PM

## 2015-08-19 NOTE — Progress Notes (Signed)
ANTICOAGULATION CONSULT NOTE - Initial Consult  Pharmacy Consult for Heparin  Indication: atrial fibrillation  Allergies  Allergen Reactions  . Dilaudid [Hydromorphone Hcl] Nausea And Vomiting  . Morphine And Related Other (See Comments)    Reaction:  Lightheadedness  Patient's daughter says she can take morphine.    Patient Measurements: Height:  (149.9 cm) Weight: 206 lb 8 oz (93.668 kg) IBW/kg (Calculated) : 43.2 Heparin Dosing Weight: 65.7 kg   Vital Signs: Temp: 98.4 F (36.9 C) (12/05 1733) Temp Source: Oral (12/05 1733) BP: 135/84 mmHg (12/06 0002) Pulse Rate: 102 (12/06 0002)  Labs:  Recent Labs  08/18/15 1127 08/18/15 1608 08/18/15 1750 08/19/15 0001  HGB 11.8*  --   --   --   HCT 37.8  --   --   --   PLT 150  --   --   --   HEPARINUNFRC  --   --   --  0.56  CREATININE 1.23*  --   --   --   TROPONINI 0.10* 4.44* 9.19*  --     Estimated Creatinine Clearance: 31.6 mL/min (by C-G formula based on Cr of 1.23).   Medical History: Past Medical History  Diagnosis Date  . Cancer (HCC)   . Blood transfusion without reported diagnosis   . CHF (congestive heart failure) (HCC)   . Coronary artery disease   . Hypertension   . Shortness of breath dyspnea   . Diabetes mellitus without complication (HCC)   . Peripheral vascular disease (HCC)     Medications:  Prescriptions prior to admission  Medication Sig Dispense Refill Last Dose  . acetaminophen (TYLENOL) 325 MG tablet Take 2 tablets (650 mg total) by mouth every 6 (six) hours as needed for mild pain (or Fever >/= 101).   PRN at PRN  . allopurinol (ZYLOPRIM) 100 MG tablet Take 100 mg by mouth daily.    08/18/2015 at unknown  . ALPRAZolam (XANAX) 0.25 MG tablet Take 1 tablet (0.25 mg total) by mouth 3 (three) times daily. 30 tablet 0 08/18/2015 at unknown  . aspirin EC 81 MG tablet Take 81 mg by mouth daily.   08/18/2015 at Unknown time  . atorvastatin (LIPITOR) 20 MG tablet Take 20 mg by mouth at  bedtime.   08/17/2015 at unknown  . azelastine (ASTELIN) 0.1 % nasal spray Place 2 sprays into both nostrils 2 (two) times daily. Use in each nostril as directed   08/18/2015 at unknown  . diltiazem (CARDIZEM) 30 MG tablet Take 1 tablet (30 mg total) by mouth every 8 (eight) hours.   08/18/2015 at Unknown time  . escitalopram (LEXAPRO) 5 MG tablet Take 1 tablet (5 mg total) by mouth daily. (Patient taking differently: Take 10 mg by mouth daily. )   08/18/2015 at unknown  . ferrous sulfate 325 (65 FE) MG tablet Take 325 mg by mouth 2 (two) times daily.   08/17/2015 at unknown  . furosemide (LASIX) 20 MG tablet Take 20 mg by mouth 2 (two) times daily.   08/18/2015 at unknown  . gabapentin (NEURONTIN) 300 MG capsule Take 300 mg by mouth 3 (three) times daily.   08/18/2015 at unknown  . insulin aspart (NOVOLOG) 100 UNIT/ML injection Inject 0-15 Units into the skin 3 (three) times daily with meals. 10 mL 11 08/18/2015 at unknown  . insulin detemir (LEVEMIR) 100 UNIT/ML injection Inject 0.42 mLs (42 Units total) into the skin at bedtime. (Patient taking differently: Inject 57 Units into the skin daily. )  10 mL 11 08/18/2015 at unknown  . ipratropium-albuterol (DUONEB) 0.5-2.5 (3) MG/3ML SOLN Take 3 mLs by nebulization every 6 (six) hours as needed (for shortness of breath).   PRN at PRN  . isosorbide mononitrate (IMDUR) 60 MG 24 hr tablet Take 60 mg by mouth daily.   08/18/2015 at unknown  . magnesium oxide (MAG-OX) 400 MG tablet Take 400 mg by mouth daily.   08/17/2015 at unknown  . metoprolol tartrate (LOPRESSOR) 25 MG tablet Take 1 tablet (25 mg total) by mouth 3 (three) times daily. 1 tablet 1 08/18/2015 at unknown  . Multiple Vitamin (MULTIVITAMIN WITH MINERALS) TABS tablet Take 1 tablet by mouth daily.   08/17/2015 at unknown  . oxyCODONE-acetaminophen (PERCOCET/ROXICET) 5-325 MG per tablet One to two tabs every four hours as needed for pain (Patient taking differently: Take 1-2 tablets by mouth every 4 (four)  hours as needed for severe pain. ) 30 tablet 0 PRN at PRN  . pantoprazole (PROTONIX) 40 MG tablet Take 1 tablet (40 mg total) by mouth 2 (two) times daily. 1 tablet 1 08/18/2015 at unknown  . polyethylene glycol (MIRALAX / GLYCOLAX) packet Take 17 g by mouth at bedtime.    08/17/2015 at unknown  . senna-docusate (SENOKOT-S) 8.6-50 MG tablet Take 1 tablet by mouth 2 (two) times daily.   08/18/2015 at unknown  . spironolactone (ALDACTONE) 25 MG tablet Take 1 tablet (25 mg total) by mouth daily. 1 tablet 1 08/18/2015 at unknown  . temazepam (RESTORIL) 15 MG capsule Take 1 capsule (15 mg total) by mouth at bedtime. 30 capsule 0 08/17/2015 at unknown  . docusate sodium (COLACE) 100 MG capsule Take 1 capsule (100 mg total) by mouth 2 (two) times daily. (Patient not taking: Reported on 08/18/2015) 10 capsule 0 unknown at unknown  . torsemide (DEMADEX) 10 MG tablet Take 1 tablet (10 mg total) by mouth daily. (Patient not taking: Reported on 08/18/2015) 1 tablet 1     Assessment: Pharmacy consulted to dose heparin in this 81 year old female admitted with Afib.  No prior anticoag noted.   12/6 12mn Heparin Level resulted at 0.56.  Goal of Therapy:  Heparin level 0.3-0.7 units/ml Monitor platelets by anticoagulation protocol: Yes   Plan:  Will continue Heparin drip at 1000 units/hr. Will check a confirmatory Heparin level in 8hrs at 0800.  Will check CBC daily.  Clovia CuffLisa Yoskar Murrillo, PharmD, BCPS 08/19/2015 12:42 AM

## 2015-08-19 NOTE — NC FL2 (Signed)
[place] MEDICAID FL2 LEVEL OF CARE SCREENING TOOL     IDENTIFICATION  Patient Name: [name] Birthdate: [birth year] Sex: female Admission Date (Current Location): 08/18/2015  [place] and [place] Number: [facility] and Address:  [facility], [address], [place], [place] [address]      Provider Number: [id number]  Attending Physician Name and Address:  Shaune Pollack, MD  Relative Name and Phone Number:       Current Level of Care: SNF Recommended Level of Care: Nursing Facility Prior Approval Number:    Date Approved/Denied:   PASRR Number:    Discharge Plan: SNF [facility]    Current Diagnoses: Patient Active Problem List   Diagnosis Date Noted  . Atrial fibrillation with RVR (HCC) 08/18/2015  . CHF (congestive heart failure) (HCC) 08/18/2015  . Ischemic leg 06/05/2015  . Ischemia of lower extremity 05/26/2015  . Aortic heart valve narrowing 05/26/2015  . Arthritis urica 05/26/2015  . H/O angina pectoris 05/26/2015  . Peripheral blood vessel disorder (HCC) 05/26/2015  . Arteriosclerosis of coronary artery 01/25/2014  . Heart failure (HCC) 01/25/2014  . Chronic kidney disease (CKD), stage III (moderate) 01/25/2014  . HLD (hyperlipidemia) 01/25/2014  . BP (high blood pressure) 01/25/2014  . Arthritis, degenerative 01/25/2014  . Hypertensive pulmonary vascular disease (HCC) 01/25/2014  . Cardiac failure right (HCC) 01/25/2014  . Diabetes mellitus, type 2 (HCC) 01/25/2014  . Herpes zona 07/14/2009    Orientation ACTIVITIES/SOCIAL BLADDER RESPIRATION    Self, Time, Situation, Place  Family supportive Continent, Incontinent Normal  BEHAVIORAL SYMPTOMS/MOOD NEUROLOGICAL BOWEL NUTRITION STATUS      Continent, Incontinent  Heart Healthy/Carb Modified  PHYSICIAN VISITS COMMUNICATION OF NEEDS Height & Weight Skin  30 days  Verbal  5' (152.4 cm) 207 lbs. Normal          AMBULATORY STATUS RESPIRATION    Assist  extensive O2 required ( nasal)      Personal Care Assistance Level of Assistance  Bathing, Dressing, Total care Bathing Assistance: Maximum assistance   Dressing Assistance: Maximum assistance Total Care Assistance: Maximum assistance    Functional Limitations Info  Hearing Seeing-adequate Communicating-verbal   Hearing Info: Impaired         SPECIAL CARE FACTORS FREQUENCY  PT (By licensed PT)                   Additional Factors Info                  Current Medications (08/19/2015):  This is the current hospital active medication list Current Facility-Administered Medications  Medication Dose Route Frequency Provider Last Rate Last Dose  . allopurinol (ZYLOPRIM) tablet 100 mg  100 mg Oral Daily Altamese Dilling, MD   100 mg at 08/19/15 0903  . ALPRAZolam Prudy Feeler) tablet 0.25 mg  0.25 mg Oral TID Altamese Dilling, MD   0.25 mg at 08/19/15 0807  . antiseptic oral rinse (CPC / CETYLPYRIDINIUM CHLORIDE 0.05%) solution 7 mL  7 mL Mouth Rinse BID Altamese Dilling, MD   7 mL at 08/19/15 0914  . aspirin EC tablet 81 mg  81 mg Oral Daily Altamese Dilling, MD   81 mg at 08/19/15 0808  . atorvastatin (LIPITOR) tablet 20 mg  20 mg Oral QHS Altamese Dilling, MD   20 mg at 08/18/15 2259  . azelastine (ASTELIN) 0.1 % nasal spray 2 spray  2 spray Each Nare BID Altamese Dilling, MD   2 spray at 08/19/15 0814  .  azithromycin (ZITHROMAX) 250 mg in dextrose 5 % 125 mL IVPB  250 mg Intravenous Q24H Altamese Dilling, MD 125 mL/hr at 08/19/15 1540 250 mg at 08/19/15 1540  . cefTRIAXone (ROCEPHIN) 1 g in dextrose 5 % 50 mL IVPB  1 g Intravenous Q24H Altamese Dilling, MD 100 mL/hr at 08/19/15 1502 1 g at 08/19/15 1502  . diltiazem (CARDIZEM) 100 mg in dextrose 5 % 100 mL (1 mg/mL) infusion  5-15 mg/hr Intravenous Titrated Altamese Dilling, MD 5 mL/hr at 08/19/15 1043 5 mg/hr at 08/19/15 1043  . diltiazem (CARDIZEM) tablet 30 mg  30 mg Oral 4 times  per day Alwyn Pea, MD   30 mg at 08/19/15 1051  . docusate sodium (COLACE) capsule 100 mg  100 mg Oral BID Altamese Dilling, MD   100 mg at 08/19/15 0807  . escitalopram (LEXAPRO) tablet 10 mg  10 mg Oral Daily Altamese Dilling, MD   10 mg at 08/19/15 1610  . ferrous sulfate tablet 325 mg  325 mg Oral BID Altamese Dilling, MD   325 mg at 08/19/15 0808  . furosemide (LASIX) injection 20 mg  20 mg Intravenous Q8H Altamese Dilling, MD   20 mg at 08/19/15 1502  . gabapentin (NEURONTIN) capsule 300 mg  300 mg Oral TID Altamese Dilling, MD   300 mg at 08/19/15 0807  . heparin ADULT infusion 100 units/mL (25000 units/250 mL)  1,000 Units/hr Intravenous Continuous Altamese Dilling, MD 10 mL/hr at 08/18/15 1945 1,000 Units/hr at 08/18/15 1945  . insulin aspart (novoLOG) injection 0-15 Units  0-15 Units Subcutaneous TID WC Shaune Pollack, MD   11 Units at 08/19/15 1209  . insulin aspart (novoLOG) injection 0-5 Units  0-5 Units Subcutaneous QHS Shaune Pollack, MD      . insulin detemir (LEVEMIR) injection 42 Units  42 Units Subcutaneous QHS Shaune Pollack, MD      . ipratropium-albuterol (DUONEB) 0.5-2.5 (3) MG/3ML nebulizer solution 3 mL  3 mL Nebulization Q4H Altamese Dilling, MD   3 mL at 08/19/15 1135  . isosorbide mononitrate (IMDUR) 24 hr tablet 60 mg  60 mg Oral Daily Altamese Dilling, MD   60 mg at 08/19/15 0808  . magnesium oxide (MAG-OX) tablet 400 mg  400 mg Oral Daily Altamese Dilling, MD   400 mg at 08/19/15 0808  . methylPREDNISolone sodium succinate (SOLU-MEDROL) 125 mg/2 mL injection 60 mg  60 mg Intravenous Q6H Altamese Dilling, MD   60 mg at 08/19/15 1502  . metoprolol tartrate (LOPRESSOR) tablet 25 mg  25 mg Oral TID Altamese Dilling, MD   25 mg at 08/19/15 0808  . multivitamin with minerals tablet 1 tablet  1 tablet Oral Daily Altamese Dilling, MD   1 tablet at 08/19/15 [phone]  . oxyCODONE-acetaminophen (PERCOCET/ROXICET) 5-325 MG per tablet  1-2 tablet  1-2 tablet Oral Q6H PRN Altamese Dilling, MD   2 tablet at 08/18/15 1419  . pantoprazole (PROTONIX) EC tablet 40 mg  40 mg Oral BID Altamese Dilling, MD   40 mg at 08/19/15 0807  . polyethylene glycol (MIRALAX / GLYCOLAX) packet 17 g  17 g Oral QHS Altamese Dilling, MD   17 g at 08/18/15 2254  . senna-docusate (Senokot-S) tablet 1 tablet  1 tablet Oral BID Altamese Dilling, MD   1 tablet at 08/19/15 0807  . sodium chloride 0.9 % injection 3 mL  3 mL Intravenous PRN Altamese Dilling, MD   3 mL at 08/19/15 5409  . sodium chloride 0.9 % injection 3 mL  3 mL Intravenous Q12H Altamese DillingVaibhavkumar Vachhani, MD   3 mL at 08/19/15 0815  . spironolactone (ALDACTONE) tablet 25 mg  25 mg Oral Daily Altamese DillingVaibhavkumar Vachhani, MD   25 mg at 08/19/15 0808  . temazepam (RESTORIL) capsule 15 mg  15 mg Oral QHS Altamese DillingVaibhavkumar Vachhani, MD   15 mg at 08/18/15 2251  . tiotropium (SPIRIVA) inhalation capsule 18 mcg  18 mcg Inhalation Daily Altamese DillingVaibhavkumar Vachhani, MD   18 mcg at 08/19/15 [id number]     Discharge Medications: Please see discharge summary for a list of discharge medications.  Relevant Imaging Results:  Relevant Lab Results:  Recent Labs    Additional Information    Cheron SchaumannBandi, Orlyn Odonoghue M, LCSW

## 2015-08-19 NOTE — Clinical Documentation Improvement (Signed)
Hospitalist  Can the diagnosis of CHF be further specified?    Acuity - Acute, Chronic, Acute on Chronic   Type - Systolic, Diastolic, Systolic and Diastolic  Other  Clinically Undetermined   Document any associated diagnoses/conditions   Supporting Information: States in record that pt has CHF and will ask cardiologist.  Cardiologist did not address type or acuity.   Please exercise your independent, professional judgment when responding. A specific answer is not anticipated or expected.   Thank Modesta MessingYou,  Aqil Goetting L [facility] [place] [phone]

## 2015-08-19 NOTE — Clinical Social Work Note (Signed)
Clinical Social Worker received consult as pt was admitted from [facility]. CSW spoke with facility and pt is a STR resident and able to return at discharge. Assessment to follow. CSW will continue to follow.   Dede QuerySarah Jaedah Lords, MSW, LCSW Clinical Social Worker  [phone]

## 2015-08-19 NOTE — Consult Note (Signed)
Reason for Consult: non STEMI atrial fibrillation shortness of breath Referring Physician:  Dr Anselm Jungling Cardiologists Dr  Ubaldo Glassing  [name] is an 81 y.o. female.  HPI:  58 jail female with history of congestive heart failure coronary disease hypertension diabetes peripheral vascular disease status post right AKA . COPD symptoms and pneumonia with bronchitis. Patient complained of significant shortness of breath cough congestion sputum production over the last several days. Patient complained of weight gain over the last week or so as well as peripheral leg swelling. The patient complains of using nebulizer without improvement. She has complained of mild tightness in the chest but mostly shortness of breath cough and congestion. In emergency room she was found to be in atrial fibrillation she was placed on Cardizem given Lasix for some evidence of pulmonary edema and and transferred to telemetry she was put on supplemental oxygen as well. Patient denies any history of smoking or states she may have had pneumonia recently and has had persistent recurrent shortness of breath cough congestion. Patient reportedly has a history of pulmonary hypertension she also has valvular disease of the aortic valve she was weak evaluated at [facility] for possible valve replacement or treatment. Patient was treated medically and is done reasonably well until she needed AKA on the right leg and now most recently cough congestion and shortness of breath. Patient has a distant history of coronary bypass surgery with subsequent failure of 2 bypass grafts as well as heart failure which she has been treated medically since.  Past Medical History  Diagnosis Date  . Cancer ([place])   . Blood transfusion without reported diagnosis   . CHF (congestive heart failure) ([place])   . Coronary artery disease   . Hypertension   . Shortness of breath dyspnea   . Diabetes mellitus without complication ([place])   . Peripheral vascular disease [facility])      Past Surgical History  Procedure Laterality Date  . Back surgery    . Coronary artery bypass graft    . Vascular surgery    . Peripheral vascular catheterization Right 05/27/2015    Procedure: Lower Extremity Angiography;  Surgeon: Katha Cabal, MD;  Location: [facility];  Service: Cardiovascular;  Laterality: Right;  . Peripheral vascular catheterization  05/27/2015    Procedure: Lower Extremity Intervention;  Surgeon: Katha Cabal, MD;  Location: [facility] [facility];  Service: Cardiovascular;;  . Amputation Right 06/06/2015    Procedure: AMPUTATION ABOVE KNEE;  Surgeon: Algernon Huxley, MD;  Location: [facility];  Service: Vascular;  Laterality: Right;  . Above the knee amputation      right leg    Family History  Problem Relation Age of Onset  . Diabetes Mother   . Diabetes Father     Social History:  reports that she has never smoked. She does not have any smokeless tobacco history on file. She reports that she does not drink alcohol or use illicit drugs.  Allergies:  Allergies  Allergen Reactions  . Dilaudid [Hydromorphone Hcl] Nausea And Vomiting  . Morphine And Related Other (See Comments)    Reaction:  Lightheadedness  Patient's daughter says she can take morphine.    Medications: I have reviewed the patient's current medications.  Results for orders placed or performed during the hospital encounter of 08/18/15 (from the past 48 hour(s))  CBC with Differential     Status: Abnormal   Collection Time: 08/18/15 11:27 AM  Result Value Ref Range   WBC  6.5 3.6 - 11.0 K/uL   RBC 4.15 3.80 - 5.20 MIL/uL   Hemoglobin 11.8 (L) 12.0 - 16.0 g/dL   HCT 37.8 35.0 - 47.0 %   MCV 90.9 80.0 - 100.0 fL   MCH 28.4 26.0 - 34.0 pg   MCHC 31.3 (L) 32.0 - 36.0 g/dL   RDW 18.4 (H) 11.5 - 14.5 %   Platelets 150 150 - 440 K/uL   Neutrophils Relative % 79 %   Neutro Abs 5.1 1.4 - 6.5 K/uL   Lymphocytes Relative 13 %   Lymphs Abs 0.8 (L) 1.0 - 3.6 K/uL   Monocytes  Relative 8 %   Monocytes Absolute 0.5 0.2 - 0.9 K/uL   Eosinophils Relative 1 %   Eosinophils Absolute 0.0 0 - 0.7 K/uL   Basophils Relative 1 %   Basophils Absolute 0.0 0 - 0.1 K/uL  Basic metabolic panel     Status: Abnormal   Collection Time: 08/18/15 11:27 AM  Result Value Ref Range   Sodium 143 135 - 145 mmol/L   Potassium 4.0 3.5 - 5.1 mmol/L   Chloride 97 (L) 101 - 111 mmol/L   CO2 38 (H) 22 - 32 mmol/L   Glucose, Bld 270 (H) 65 - 99 mg/dL   BUN 36 (H) 6 - 20 mg/dL   Creatinine, Ser 1.23 (H) 0.44 - 1.00 mg/dL   Calcium 9.1 8.9 - 10.3 mg/dL   GFR calc non Af Amer 38 (L) >60 mL/min   GFR calc Af Amer 44 (L) >60 mL/min    Comment: (NOTE) The eGFR has been calculated using the CKD EPI equation. This calculation has not been validated in all clinical situations. eGFR's persistently <60 mL/min signify possible Chronic Kidney Disease.    Anion gap 8 5 - 15  Brain natriuretic peptide     Status: Abnormal   Collection Time: 08/18/15 11:27 AM  Result Value Ref Range   B Natriuretic Peptide 1720.0 (H) 0.0 - 100.0 pg/mL  Troponin I     Status: Abnormal   Collection Time: 08/18/15 11:27 AM  Result Value Ref Range   Troponin I 0.10 (H) <0.031 ng/mL    Comment: READ BACK AND VERIFIED WITH AMANDA LOVETTE AT 1226 08/18/15 DAS        PERSISTENTLY INCREASED TROPONIN VALUES IN THE RANGE OF 0.04-0.49 ng/mL CAN BE SEEN IN:       -UNSTABLE ANGINA       -CONGESTIVE HEART FAILURE       -MYOCARDITIS       -CHEST TRAUMA       -ARRYHTHMIAS       -LATE PRESENTING MYOCARDIAL INFARCTION       -COPD   CLINICAL FOLLOW-UP RECOMMENDED.   Troponin I     Status: Abnormal   Collection Time: 08/18/15  4:08 PM  Result Value Ref Range   Troponin I 4.44 (H) <0.031 ng/mL    Comment: READ BACK AND VERIFIED WITH AMANDA LOVETT AT 1648 08/18/15 MLZ        POSSIBLE MYOCARDIAL ISCHEMIA. SERIAL TESTING RECOMMENDED.   Glucose, capillary     Status: Abnormal   Collection Time: 08/18/15  4:09 PM  Result  Value Ref Range   Glucose-Capillary 295 (H) 65 - 99 mg/dL  Troponin I     Status: Abnormal   Collection Time: 08/18/15  5:50 PM  Result Value Ref Range   Troponin I 9.19 (H) <0.031 ng/mL    Comment: READ BACK AND VERIFIED WITH ASHLEY WILLIAMS AT   1828 08/18/15 MLZ        POSSIBLE MYOCARDIAL ISCHEMIA. SERIAL TESTING RECOMMENDED.   MRSA PCR Screening     Status: None   Collection Time: 08/18/15  9:36 PM  Result Value Ref Range   MRSA by PCR NEGATIVE NEGATIVE    Comment:        The GeneXpert MRSA Assay (FDA approved for NASAL specimens only), is one component of a comprehensive MRSA colonization surveillance program. It is not intended to diagnose MRSA infection nor to guide or monitor treatment for MRSA infections.   Glucose, capillary     Status: Abnormal   Collection Time: 08/18/15 10:44 PM  Result Value Ref Range   Glucose-Capillary 368 (H) 65 - 99 mg/dL  Troponin I     Status: Abnormal   Collection Time: 08/19/15 12:01 AM  Result Value Ref Range   Troponin I 18.09 (H) <0.031 ng/mL    Comment: PREVIOUS RESULT CALLED TO ASHLEY WILLIAMS BY MLZ AT 1828 08/18/15 WDM        POSSIBLE MYOCARDIAL ISCHEMIA. SERIAL TESTING RECOMMENDED.   Heparin level (unfractionated)     Status: None   Collection Time: 08/19/15 12:01 AM  Result Value Ref Range   Heparin Unfractionated 0.56 0.30 - 0.70 IU/mL    Comment:        IF HEPARIN RESULTS ARE BELOW EXPECTED VALUES, AND PATIENT DOSAGE HAS BEEN CONFIRMED, SUGGEST FOLLOW UP TESTING OF ANTITHROMBIN III LEVELS.   Glucose, capillary     Status: Abnormal   Collection Time: 08/19/15  7:35 AM  Result Value Ref Range   Glucose-Capillary 341 (H) 65 - 99 mg/dL   Comment 1 Notify RN    Comment 2 Document in Chart   CBC     Status: Abnormal   Collection Time: 08/19/15  8:13 AM  Result Value Ref Range   WBC 5.3 3.6 - 11.0 K/uL   RBC 3.94 3.80 - 5.20 MIL/uL   Hemoglobin 11.2 (L) 12.0 - 16.0 g/dL   HCT 36.0 35.0 - 47.0 %   MCV 91.3 80.0 -  100.0 fL   MCH 28.5 26.0 - 34.0 pg   MCHC 31.3 (L) 32.0 - 36.0 g/dL   RDW 18.2 (H) 11.5 - 14.5 %   Platelets 158 150 - 440 K/uL  Heparin level (unfractionated)     Status: None   Collection Time: 08/19/15  8:13 AM  Result Value Ref Range   Heparin Unfractionated 0.68 0.30 - 0.70 IU/mL    Comment:        IF HEPARIN RESULTS ARE BELOW EXPECTED VALUES, AND PATIENT DOSAGE HAS BEEN CONFIRMED, SUGGEST FOLLOW UP TESTING OF ANTITHROMBIN III LEVELS.   Glucose, capillary     Status: Abnormal   Collection Time: 08/19/15 11:18 AM  Result Value Ref Range   Glucose-Capillary 335 (H) 65 - 99 mg/dL   Comment 1 Notify RN    Comment 2 Document in Chart     Dg Chest 1 View  08/18/2015  CLINICAL DATA:  Pneumonia.  Treatment for 4-5 days EXAM: CHEST 1 VIEW COMPARISON:  06/06/2015 FINDINGS: Chronic cardiopericardial enlargement. The patient is status post CABG. Stable aortic and hilar contours. Limited evaluation of the lower chest due to underpenetration. There is interstitial coarsening which is new, with small pleural effusion at least on the right. IMPRESSION: 1. CHF pattern. 2. Limited visualization of the bases, especially on the left, with pneumonia not excluded. Electronically Signed   By: Jonathon  Watts M.D.   On: 08/18/2015 11:28     Dg Chest 2 View  08/19/2015  CLINICAL DATA:  Shortness of breath. EXAM: CHEST  2 VIEW COMPARISON:  08/18/2015. FINDINGS: Prior CABG. Cardiomegaly with pulmonary interstitial prominence again noted. Small pleural effusions noted. Base consistent with persistent congestive heart failure. No pneumothorax. IMPRESSION: Prior CABG. Persistent cardiomegaly. Persistent changes of congestive heart failure with diffuse pulmonary interstitial edema . No interim improvement. Electronically Signed   By: Marcello Moores  Register   On: 08/19/2015 07:58    Review of Systems  Constitutional: Positive for malaise/fatigue.  HENT: Positive for congestion and sore throat.   Eyes: Negative.    Respiratory: Positive for cough, sputum production, shortness of breath and wheezing.   Cardiovascular: Positive for palpitations, orthopnea, claudication and leg swelling.  Gastrointestinal: Negative.   Genitourinary: Negative.   Musculoskeletal: Positive for myalgias.  Skin: Negative.   Neurological: Positive for weakness.  Endo/Heme/Allergies: Negative.   Psychiatric/Behavioral: Negative.    Blood pressure 114/60, pulse 62, temperature 97.5 F (36.4 C), temperature source Oral, resp. rate 22, height 4' 11" (1.499 m), weight 94.031 kg (207 lb 4.8 oz), SpO2 92 %. Physical Exam  Nursing note and vitals reviewed. Constitutional: She is oriented to person, place, and time. She appears well-developed and well-nourished.  HENT:  Head: Normocephalic and atraumatic.  Right Ear: External ear normal.  Eyes: Conjunctivae and EOM are normal. Pupils are equal, round, and reactive to light.  Neck: Normal range of motion. Neck supple.  Cardiovascular: S1 normal and S2 normal.  An irregularly irregular rhythm present. Tachycardia present.   Murmur heard.  Systolic murmur is present with a grade of 2/6  Pulses:      Carotid pulses are 1+ on the right side, and 1+ on the left side.      Radial pulses are 1+ on the right side, and 1+ on the left side.       Femoral pulses are 0 on the right side, and 1+ on the left side.      Right popliteal pulse not accessible.       Right dorsalis pedis pulse not accessible.       Right posterior tibial pulse not accessible.  Respiratory: She is in respiratory distress. She has wheezes. She has rhonchi. She has rales.  Musculoskeletal: Normal range of motion.  Neurological: She is alert and oriented to person, place, and time. She has normal reflexes.  Skin: Skin is warm and dry.  Psychiatric: She has a normal mood and affect.    Assessment/Plan:  non STEMI  atrial fibrillation  congestive heart failure  COPD  hypertension  shortness of breath   peripheral vascular disease  leg edema  diabetes  moderate aortic valve stenosis  hyperlipidemia  pulmonary hypertension  history of coronary bypass surgery . PLAN  agree with it may defer to telemetry   rate control with Cardizem IV  switch to p.o. Cardizem  anticoagulation short-term  switch to long-term anticoagulation possibly with Eliquis  non STEMI with consider catheterization  echocardiogram because of congestive heart failure murmur aortic stenosis atrial fibrillation  hyperlipidemia continue Lipitor therapy  continue diabetes management with NovoLog Levemir  continue Lasix therapy IV for pulmonary congestion shortness of breath  inhalers for congestion consider steroid therapy  will discuss case with primary cardiologist Dr. Ubaldo Glassing  recommend defer cardiac catheterization for now would treat medically to improve lung function                    around and breathing problems  recommend  therapy for congestive heart failure congestion shortness of breath  CALLWOOD,DWAYNE D. 08/19/2015, 11:23 AM      

## 2015-08-19 NOTE — Progress Notes (Signed)
*  PRELIMINARY RESULTS* Echocardiogram 2D Echocardiogram has been performed.  Georgann HousekeeperJerry R Hege 08/19/2015, 1:29 PM

## 2015-08-19 NOTE — Progress Notes (Signed)
[facility] [place] [facility]   PATIENT NAME: [name]    MR#:  [id number]  DATE OF BIRTH:  [birth year]  SUBJECTIVE:  CHIEF COMPLAINT:   Chief Complaint  Patient presents with  . Shortness of Breath   still cough with sputum and shortness of breath, on oxygen by nasal cannula 3 L  REVIEW OF SYSTEMS:  CONSTITUTIONAL: No fever, has generalized weakness.  EYES: No blurred or double vision.  EARS, NOSE, AND THROAT: No tinnitus or ear pain.  RESPIRATORY: Has cough, shortness of breath, no wheezing or hemoptysis.  CARDIOVASCULAR: No chest pain, orthopnea, but has leg edema.  GASTROINTESTINAL: No nausea, vomiting, diarrhea or abdominal pain.  GENITOURINARY: No dysuria, hematuria.  ENDOCRINE: No polyuria, nocturia,  HEMATOLOGY: No anemia, easy bruising or bleeding SKIN: No rash or lesion. MUSCULOSKELETAL: No joint pain or arthritis.   NEUROLOGIC: No tingling, numbness, weakness.  PSYCHIATRY: No anxiety or depression.   DRUG ALLERGIES:   Allergies  Allergen Reactions  . Dilaudid [Hydromorphone Hcl] Nausea And Vomiting  . Morphine And Related Other (See Comments)    Reaction:  Lightheadedness  Patient's daughter says she can take morphine.    VITALS:  Blood pressure 114/60, pulse 62, temperature 97.5 F (36.4 C), temperature source Oral, resp. rate 22, height 4\' 11"  (1.499 m), weight 94.031 kg (207 lb 4.8 oz), SpO2 92 %.  PHYSICAL EXAMINATION:  GENERAL:  81 y.o.-year-old patient lying in the bed with no acute distress.  EYES: Pupils equal, round, reactive to light and accommodation. No scleral icterus. Extraocular muscles intact.  HEENT: Head atraumatic, normocephalic. Oropharynx and nasopharynx clear.  NECK:  Supple, no jugular venous distention. No thyroid enlargement, no tenderness.  LUNGS: Normal breath sounds bilaterally,  but has bilateral rales, wheezing, and rhonchi. Mild use of accessory muscles of respiration.  CARDIOVASCULAR: S1, S2  normal. No murmurs, rubs, or gallops.  ABDOMEN: Soft, nontender, nondistended. Bowel sounds present. No organomegaly or mass.  EXTREMITIES: Left leg and bilateral arm edema, right AKA, no cyanosis, or clubbing.  NEUROLOGIC: Cranial nerves II through XII are intact. Muscle strength 3-4/5 in all extremities. Sensation intact. Gait not checked.  PSYCHIATRIC: The patient is alert and oriented x 3.  SKIN: No obvious rash, lesion, or ulcer.    LABORATORY PANEL:   CBC  Recent Labs Lab 08/19/15 0813  WBC 5.3  HGB 11.2*  HCT 36.0  PLT 158   ------------------------------------------------------------------------------------------------------------------  Chemistries   Recent Labs Lab 08/19/15 0813  NA 137  K 5.0  CL 94*  CO2 34*  GLUCOSE 331*  BUN 40*  CREATININE 1.69*  CALCIUM 8.6*   ------------------------------------------------------------------------------------------------------------------  Cardiac Enzymes  Recent Labs Lab 08/19/15 0001  TROPONINI 18.09*   ------------------------------------------------------------------------------------------------------------------  RADIOLOGY:  Dg Chest 1 View  08/18/2015  CLINICAL DATA:  Pneumonia.  Treatment for 4-5 days EXAM: CHEST 1 VIEW COMPARISON:  06/06/2015 FINDINGS: Chronic cardiopericardial enlargement. The patient is status post CABG. Stable aortic and hilar contours. Limited evaluation of the lower chest due to underpenetration. There is interstitial coarsening which is new, with small pleural effusion at least on the right. IMPRESSION: 1. CHF pattern. 2. Limited visualization of the bases, especially on the left, with pneumonia not excluded. Electronically Signed   By: Marnee SpringJonathon  Watts M.D.   On: 08/18/2015 11:28   Dg Chest 2 View  08/19/2015  CLINICAL DATA:  Shortness of breath. EXAM: CHEST  2 VIEW COMPARISON:  08/18/2015. FINDINGS: Prior CABG. Cardiomegaly with pulmonary interstitial prominence again noted.  Small  pleural effusions noted. Base consistent with persistent congestive heart failure. No pneumothorax. IMPRESSION: Prior CABG. Persistent cardiomegaly. Persistent changes of congestive heart failure with diffuse pulmonary interstitial edema . No interim improvement. Electronically Signed   By: Maisie Fus  Register   On: 08/19/2015 07:58    EKG:   Orders placed or performed during the hospital encounter of 08/18/15  . ED EKG  . ED EKG  . EKG 12-Lead  . EKG 12-Lead  . EKG 12-Lead  . EKG 12-Lead    ASSESSMENT AND PLAN:   * A. fib with RVR  on Cardizem IV drip and po Cardizem and metoprolol. Follow serial troponin, echocardiogram, cardiogenic consult. Started on heparin IV drip.  * Acute on chronic CHF, unknown type.  Continue IV Lasix 20 mg every 8 hours and follow-up echocardiogram.  * Acute renal failure. Possible due to acute CHF or Lasix Follow-up BMP.  * No pneumonia, per repeat chest x-ray today. She is treated with some antibiotic at nursing home for last 4 days. continue IV Rocephin as well as azithromycin due to COPD exacerbation.  * COPD exacerbation Continue IV Solu-Medrol, DuoNeb nebulizer, Spiriva. Antibiotics as mentioned above.  * Non-STEMI. Troponin is up to 18 Continue heparin IV drip. Continue aspirin and Lipitor. No cardiac catheter at this time due to above mentioned multiple medical problems per Dr Juliann Pares.  * Diabetes Increase Levemir from 35 units to 42 units subcutaneous at bedtime and continueInsulin sliding scale.     I discussed with Dr. Juliann Pares. All the records are reviewed and case discussed with Care Management/Social Workerr. Management plans discussed with the patient, her daughter and the son and they are in agreement.  CODE STATUS: DO NOT RESUSCITATE  TOTAL TIME TAKING CARE OF THIS PATIENT: 48 minutes.  Greater than 50% time was spent on coordination of care and face-to-face counseling.  POSSIBLE D/C IN 5 DAYS, DEPENDING ON CLINICAL  CONDITION.   Shaune Pollack M.D on 08/19/2015 at 3:21 PM  Between 7am to 6pm - Pager - [phone]  After 6pm go to [email or web address] - password [email or web address] [facility]  [facility] [facility]  Office  [phone]  CC: Primary care physician; Danella Penton., MD

## 2015-08-19 NOTE — Progress Notes (Signed)
Leigh from [facility] at bedside for PICC placement.

## 2015-08-19 NOTE — Progress Notes (Signed)
Patients blood sugars are not being controlled by the low sliding scale. Dr. Imogene Burnhen agreed that we need to change the sliding scale. He stated he will do it.

## 2015-08-20 ENCOUNTER — Inpatient Hospital Stay: Payer: Medicare Other

## 2015-08-20 LAB — MAGNESIUM: MAGNESIUM: 2.3 mg/dL (ref 1.7–2.4)

## 2015-08-20 LAB — HEPARIN LEVEL (UNFRACTIONATED)
Heparin Unfractionated: 0.61 IU/mL (ref 0.30–0.70)
Heparin Unfractionated: 0.65 IU/mL (ref 0.30–0.70)

## 2015-08-20 LAB — GLUCOSE, CAPILLARY
GLUCOSE-CAPILLARY: 283 mg/dL — AB (ref 65–99)
GLUCOSE-CAPILLARY: 230 mg/dL — AB (ref 65–99)
Glucose-Capillary: 212 mg/dL — ABNORMAL HIGH (ref 65–99)
Glucose-Capillary: 329 mg/dL — ABNORMAL HIGH (ref 65–99)

## 2015-08-20 LAB — CBC
HCT: 37.8 % (ref 35.0–47.0)
Hemoglobin: 11.9 g/dL — ABNORMAL LOW (ref 12.0–16.0)
MCH: 28.8 pg (ref 26.0–34.0)
MCHC: 31.5 g/dL — AB (ref 32.0–36.0)
MCV: 91.5 fL (ref 80.0–100.0)
PLATELETS: 160 10*3/uL (ref 150–440)
RBC: 4.13 MIL/uL (ref 3.80–5.20)
RDW: 18.5 % — AB (ref 11.5–14.5)
WBC: 10.2 10*3/uL (ref 3.6–11.0)

## 2015-08-20 LAB — BASIC METABOLIC PANEL
Anion gap: 9 (ref 5–15)
BUN: 47 mg/dL — AB (ref 6–20)
CALCIUM: 8.8 mg/dL — AB (ref 8.9–10.3)
CO2: 35 mmol/L — AB (ref 22–32)
CREATININE: 1.95 mg/dL — AB (ref 0.44–1.00)
Chloride: 92 mmol/L — ABNORMAL LOW (ref 101–111)
GFR calc non Af Amer: 22 mL/min — ABNORMAL LOW (ref >60)
GFR, EST AFRICAN AMERICAN: 25 mL/min — AB (ref >60)
GLUCOSE: 194 mg/dL — AB (ref 65–99)
Potassium: 5.5 mmol/L — ABNORMAL HIGH (ref 3.5–5.1)
Sodium: 136 mmol/L (ref 135–145)

## 2015-08-20 LAB — POTASSIUM: Potassium: 5.6 mmol/L — ABNORMAL HIGH (ref 3.5–5.1)

## 2015-08-20 MED ORDER — FUROSEMIDE 10 MG/ML IJ SOLN
40.0000 mg | Freq: Two times a day (BID) | INTRAMUSCULAR | Status: DC
  Administered 2015-08-21 – 2015-08-22 (×3): 40 mg via INTRAVENOUS
  Filled 2015-08-20 (×3): qty 4

## 2015-08-20 MED ORDER — FUROSEMIDE 10 MG/ML IJ SOLN
80.0000 mg | Freq: Once | INTRAMUSCULAR | Status: AC
  Administered 2015-08-20: 80 mg via INTRAVENOUS
  Filled 2015-08-20: qty 8

## 2015-08-20 NOTE — Progress Notes (Signed)
[facility] - [place] at [facility]   PATIENT NAME: Lainey Nelson    MR#:  [id number]  DATE OF BIRTH:  11-Oct-1924  SUBJECTIVE:   Patient reports she is breathing better than  REVIEW OF SYSTEMS:    Review of Systems  Constitutional: Negative for fever, chills and malaise/fatigue.  HENT: Negative for sore throat.   Eyes: Negative for blurred vision.  Respiratory: Positive for cough, sputum production, shortness of breath and wheezing. Negative for hemoptysis.   Cardiovascular: Negative for chest pain, palpitations and leg swelling.  Gastrointestinal: Negative for nausea, vomiting, abdominal pain, diarrhea and blood in stool.  Genitourinary: Negative for dysuria.  Musculoskeletal: Negative for back pain.  Neurological: Positive for weakness. Negative for dizziness, tremors and headaches.  Endo/Heme/Allergies: Does not bruise/bleed easily.    Tolerating Diet:yes      DRUG ALLERGIES:   Allergies  Allergen Reactions  . Dilaudid [Hydromorphone Hcl] Nausea And Vomiting  . Morphine And Related Other (See Comments)    Reaction:  Lightheadedness  Patient's daughter says she can take morphine.    VITALS:  Blood pressure 96/54, pulse 87, temperature 98.3 F (36.8 C), temperature source Oral, resp. rate 18, height  (1.499 m), weight 94.031 kg (207 lb 4.8 oz), SpO2 92 %.  PHYSICAL EXAMINATION:   Physical Exam  Constitutional: She is oriented to person, place, and time and well-developed, well-nourished, and in no distress. No distress.  HENT:  Head: Normocephalic.  Eyes: No scleral icterus.  Neck: Normal range of motion. Neck supple. No JVD present. No tracheal deviation present.  Cardiovascular: Normal rate and regular rhythm.  Exam reveals no gallop and no friction rub.   Murmur heard. Pulmonary/Chest: Effort normal. No respiratory distress. She has wheezes. She has no rales. She exhibits no tenderness.  Tight   Abdominal: Soft. Bowel sounds  are normal. She exhibits no distension and no mass. There is no tenderness. There is no rebound and no guarding.  Musculoskeletal: Normal range of motion. She exhibits no edema.  Neurological: She is alert and oriented to person, place, and time.  Skin: Skin is warm. No rash noted. No erythema.  Psychiatric: Affect and judgment normal.      LABORATORY PANEL:   CBC  Recent Labs Lab 08/20/15 0432  WBC 10.2  HGB 11.9*  HCT 37.8  PLT 160   ------------------------------------------------------------------------------------------------------------------  Chemistries   Recent Labs Lab 08/20/15 0432  NA 136  K 5.5*  CL 92*  CO2 35*  GLUCOSE 194*  BUN 47*  CREATININE 1.95*  CALCIUM 8.8*  MG 2.3   ------------------------------------------------------------------------------------------------------------------  Cardiac Enzymes  Recent Labs Lab 08/18/15 1608 08/18/15 1750 08/19/15 0001  TROPONINI 4.44* 9.19* 18.09*   ------------------------------------------------------------------------------------------------------------------  RADIOLOGY:  Dg Chest 2 View  08/19/2015  CLINICAL DATA:  Shortness of breath. EXAM: CHEST  2 VIEW COMPARISON:  08/18/2015. FINDINGS: Prior CABG. Cardiomegaly with pulmonary interstitial prominence again noted. Small pleural effusions noted. Base consistent with persistent congestive heart failure. No pneumothorax. IMPRESSION: Prior CABG. Persistent cardiomegaly. Persistent changes of congestive heart failure with diffuse pulmonary interstitial edema . No interim improvement. Electronically Signed   By: Maisie Fus  Register   On: 08/19/2015 07:58   Dg Chest Port 1 View  08/19/2015  CLINICAL DATA:  Left PICC line placement EXAM: PORTABLE CHEST 1 VIEW COMPARISON:  08/2015 FINDINGS: Left PICC line tip mid SVC level. Prior coronal bypass changes noted. Heart is enlarged with worsening mid and lower lung airspace process, edema is favored. Pleural  effusions not excluded. Associated basilar atelectasis also evident. Upper lobes are clear. Atherosclerosis of aorta. IMPRESSION: Left PICC line tip upper SVC. Cardiomegaly with worsening airspace process, edema is favored Electronically Signed   By: Judie PetitM.  Shick M.D.   On: 08/19/2015 18:42     ASSESSMENT AND PLAN:    81 year old female with a history of coronary artery disease and diastolic heart failure who presented with shortness of breath and atrial fibrillation with RVR.  1. Atrial fibrillation with RVR: Patient's heart rates have now improved. Continue metoprolol and diltiazem. She is currently on heparin drip.  2. Non-ST elevation: Appreciate cardiology consultation. Due to patient's respiratory status she is unable to undergo cardiac catheterization. Continue heparin drip, Imdur, metoprolol, aspirin and atorvastatin.  3. Acute on chronic diastolic heart failure: Patient's ejection fraction was 50-55%. She has moderate TR on echocardiogram. There is no mention of aortic stenosis. Continue Lasix. I suspect she became fluid overloaded from the elevation in her heart rate.  4. Acute on chronic hypoxic respiratory failure: This is likely secondary to the circumflex heart failure along with atrial fibrillation. She continues have increased O2 need. Her chest x-ray yesterday does show pulmonary edema. I will repeat chest x-ray tomorrow.  6. COPD exacerbation: Continue with IV steroids, nebulizers and inhalers. Discontinue Rocephin and azithromycin. There is no evidence of chest x-ray.  7. Diabetes: Continue Levemir and sliding scale insulin.    8. Acute kidney injury: I suspect this is due to ATN from tachycardia on admission along with acute on chronic hypoxic respiratory failure. There may also be a component of heart failure. Continue to monitor creatinine. I will consult nephrology.      Management plans discussed with the patient and she is in agreement.  CODE STATUS:  FULL  TOTAL TIME TAKING CARE OF THIS PATIENT: 30 minutes.     POSSIBLE D/C 3-4 days, DEPENDING ON CLINICAL CONDITION.   Azzure Garabedian M.D on 08/20/2015 at 12:21 PM  Between 7am to 6pm - Pager - [phone] After 6pm go to [email or web address] - password [email or web address] [facility]  [facility]  Office  [phone]  CC: Primary care physician; Danella PentonMILLER,MARK F., MD  Note: This dictation was prepared with Dragon dictation along with smaller phrase technology. Any transcriptional errors that result from this process are unintentional.

## 2015-08-20 NOTE — Clinical Social Work Note (Signed)
Clinical Social Work Assessment  Patient Details  Name: [name] MRN: [id number] Date of Birth: 20-May-1925  Date of referral:  08/20/15               Reason for consult:   (From [facility])                Permission sought to share information with:  Facility Medical sales representativeContact Representative, Family Supports Permission granted to share information::  Yes, Verbal Permission Granted  Name::      (son Mariana KaufmanMarvin [phone])  Agency::     Relationship::     Contact Information:     Housing/Transportation Living arrangements for the past 2 months:  Skilled Building surveyorursing Facility Source of Information:  Medical Team, Facility, Adult Children Patient Interpreter Needed:  None Criminal Activity/Legal Involvement Pertinent to Current Situation/Hospitalization:    Significant Relationships:  Adult Children Lives with:  Self, Facility Resident Do you feel safe going back to the place where you live?    Need for family participation in patient care:  Yes (Comment)  Care giving concerns:  None at this time   Office managerocial Worker assessment / plan:  Visual merchandiserClinical Social Worker (CSW) consult, patient is from [facility].   Patient was not alert when CSW entered the room.  Patient was sleeping comfortable and unanble to wake.  (Information provided by patient's son POA over the phone).    CSW introduced self and explained role of CSW department. Patient is currently at [facility] for STR, has been there for the past several months and plans to return at discharge.   Patient's support system consist of her two sons and daughter [name].  Prior to [facility], patient lives home alone.  Family is discussing long-term plans for patient once SNF days run out.  CSW will complete FL2, and contact facility in anticipation of patient returning to [facility] at discharge.       Employment status:  Disabled (Comment on whether or not currently receiving Disability) Insurance information:  Managed Medicare PT Recommendations:  Skilled  Nursing Facility Information / Referral to community resources:  Skilled Nursing Facility  Patient/Family's Response to care:  Patient's son was appreciative of information provided by CSW.   Patient/Family's Understanding of and Emotional Response to Diagnosis, Current Treatment, and Prognosis:  Patient 's son understands that patient is under continued medical work up at this time.  Once medically stable she will discharge back to [facility] to continue her rehab.  Emotional Assessment Appearance:  Appears stated age Attitude/Demeanor/Rapport:    Affect (typically observed):  Unable to Assess Orientation:  Oriented to Self, Oriented to Place, Oriented to Situation Alcohol / Substance use:  Never Used Psych involvement (Current and /or in the community):  No (Comment)  Discharge Needs  Concerns to be addressed:  Care Coordination, Discharge Planning Concerns Readmission within the last 30 days:  No Current discharge risk:  Chronically ill, Dependent with Mobility Barriers to Discharge:  Continued Medical Work up   [employer] ServiceMaster CompanyMoore, Deborah H, LCSW 08/20/2015, 11:00 AM

## 2015-08-20 NOTE — Progress Notes (Signed)
Pharmacy Consult for Heparin  Indication: atrial fibrillation  Allergies  Allergen Reactions  . Dilaudid [Hydromorphone Hcl] Nausea And Vomiting  . Morphine And Related Other (See Comments)    Reaction:  Lightheadedness  Patient's daughter says she can take morphine.    Patient Measurements: Height: 4\' 11"  (149.9 cm) Weight: 207 lb 4.8 oz (94.031 kg) IBW/kg (Calculated) : 43.2 Heparin Dosing Weight: 65.7 kg   Vital Signs: Temp: 98.3 F (36.8 C) (12/07 1152) Temp Source: Oral (12/07 0540) BP: 116/66 mmHg (12/07 1248) Pulse Rate: 63 (12/07 1248)  Labs:  Recent Labs  08/18/15 1127 08/18/15 1608 08/18/15 1750  08/19/15 0001 08/19/15 0813 08/19/15 1846 08/20/15 0432 08/20/15 1254  HGB 11.8*  --   --   --   --  11.2*  --  11.9*  --   HCT 37.8  --   --   --   --  36.0  --  37.8  --   PLT 150  --   --   --   --  158  --  160  --   HEPARINUNFRC  --   --   --   < > 0.56 0.68 0.71* 0.61 0.65  CREATININE 1.23*  --   --   --   --  1.69*  --  1.95*  --   TROPONINI 0.10* 4.44* 9.19*  --  18.09*  --   --   --   --   < > = values in this interval not displayed.  Estimated Creatinine Clearance: 20 mL/min (by C-G formula based on Cr of 1.95).   Assessment: Pharmacy consulted to dose heparin in this 81 year old female admitted with Afib.  No prior anticoag noted.   HL= 0.68  Goal of Therapy:  Heparin level 0.3-0.7 units/ml Monitor platelets by anticoagulation protocol: Yes   Plan:  Current orders for heparin 950units/hr. Repeat heparin level therapeutic. Will recheck heparin level and CBC with AM labs.  Pharmacy to follow per consult  Garlon HatchetJody Syanna Remmert, PharmD Clinical Pharmacist  08/20/2015 2:08 PM

## 2015-08-20 NOTE — NC FL2 (Signed)
[place] MEDICAID FL2 LEVEL OF CARE SCREENING TOOL     IDENTIFICATION  Patient Name: [name] Birthdate: 06/03/1925 Sex: female Admission Date (Current Location): 08/18/2015  [place] and [place] Number: [place] and Address:  [facility], [address], [place], [place] [address]      Provider Number: [id number]  Attending Physician Name and Address:  Adrian Saran, MD  Relative Name and Phone Number:       Current Level of Care: SNF Recommended Level of Care: Nursing Facility Prior Approval Number:    Date Approved/Denied:   PASRR Number:  ( [id number] )  Discharge Plan: SNF    Current Diagnoses: Patient Active Problem List   Diagnosis Date Noted  . Atrial fibrillation with RVR (HCC) 08/18/2015  . CHF (congestive heart failure) (HCC) 08/18/2015  . Ischemic leg 06/05/2015  . Ischemia of lower extremity 05/26/2015  . Aortic heart valve narrowing 05/26/2015  . Arthritis urica 05/26/2015  . H/O angina pectoris 05/26/2015  . Peripheral blood vessel disorder (HCC) 05/26/2015  . Arteriosclerosis of coronary artery 01/25/2014  . Heart failure (HCC) 01/25/2014  . Chronic kidney disease (CKD), stage III (moderate) 01/25/2014  . HLD (hyperlipidemia) 01/25/2014  . BP (high blood pressure) 01/25/2014  . Arthritis, degenerative 01/25/2014  . Hypertensive pulmonary vascular disease (HCC) 01/25/2014  . Cardiac failure right (HCC) 01/25/2014  . Diabetes mellitus, type 2 (HCC) 01/25/2014  . Herpes zona 07/14/2009    Orientation ACTIVITIES/SOCIAL BLADDER RESPIRATION    Self, Time, Situation, Place  Family supportive Incontinent (at times) O2 (As needed)  BEHAVIORAL SYMPTOMS/MOOD NEUROLOGICAL BOWEL NUTRITION STATUS      Continent    PHYSICIAN VISITS COMMUNICATION OF NEEDS Height & Weight Skin  30 days Verbally 5' (152.4 cm) 207 lbs. Normal          AMBULATORY STATUS RESPIRATION    Assist extensive O2 (As needed)       Personal Care Assistance Level of Assistance  Bathing, Dressing, Total care Bathing Assistance: Maximum assistance   Dressing Assistance: Maximum assistance Total Care Assistance: Maximum assistance    Functional Limitations Info  Hearing, Sight, Speech Sight Info: Adequate Hearing Info: Impaired Speech Info: Adequate       SPECIAL CARE FACTORS FREQUENCY  PT (By licensed PT)                   Additional Factors Info  Allergies, Insulin Sliding Scale, Code Status Code Status Info:  (DNR) Allergies Info:  (Dilaudid, Morphine And Related)           DISCHARGE MEDICATIONS:   Current Discharge Medication List    START taking these medications   Details  apixaban (ELIQUIS) 2.5 MG TABS tablet Take 1 tablet (2.5 mg total) by mouth 2 (two) times daily. Qty: 60 tablet, Refills: 0    diltiazem (CARDIZEM CD) 120 MG 24 hr capsule Take 1 capsule (120 mg total) by mouth daily. Qty: 30 capsule, Refills: 0    predniSONE (DELTASONE) 10 MG tablet Take 1 tablet (10 mg total) by mouth daily with breakfast. Qty: 20 tablet, Refills: 0    tiotropium (SPIRIVA) 18 MCG inhalation capsule Place 1 capsule (18 mcg total) into inhaler and inhale daily. Qty: 30 capsule, Refills: 12      CONTINUE these medications which have CHANGED   Details  furosemide (LASIX) 20 MG tablet 40 mg in am and 20 mg at 1400 Qty: 30 tablet, Refills: 0      CONTINUE these  medications which have NOT CHANGED   Details  acetaminophen (TYLENOL) 325 MG tablet Take 2 tablets (650 mg total) by mouth every 6 (six) hours as needed for mild pain (or Fever >/= 101).    allopurinol (ZYLOPRIM) 100 MG tablet Take 100 mg by mouth daily.     ALPRAZolam (XANAX) 0.25 MG tablet Take 1 tablet (0.25 mg total) by mouth 3 (three) times daily. Qty: 30 tablet, Refills: 0    aspirin EC 81 MG tablet Take 81 mg by mouth daily.    atorvastatin (LIPITOR) 20 MG tablet Take 20 mg by  mouth at bedtime.    azelastine (ASTELIN) 0.1 % nasal spray Place 2 sprays into both nostrils 2 (two) times daily. Use in each nostril as directed    escitalopram (LEXAPRO) 5 MG tablet Take 1 tablet (5 mg total) by mouth daily.    ferrous sulfate 325 (65 FE) MG tablet Take 325 mg by mouth 2 (two) times daily.    gabapentin (NEURONTIN) 300 MG capsule Take 300 mg by mouth 3 (three) times daily.    insulin aspart (NOVOLOG) 100 UNIT/ML injection Inject 0-15 Units into the skin 3 (three) times daily with meals. Qty: 10 mL, Refills: 11    insulin detemir (LEVEMIR) 100 UNIT/ML injection Inject 0.42 mLs (42 Units total) into the skin at bedtime. Qty: 10 mL, Refills: 11    ipratropium-albuterol (DUONEB) 0.5-2.5 (3) MG/3ML SOLN Take 3 mLs by nebulization every 6 (six) hours as needed (for shortness of breath).    isosorbide mononitrate (IMDUR) 60 MG 24 hr tablet Take 60 mg by mouth daily.    magnesium oxide (MAG-OX) 400 MG tablet Take 400 mg by mouth daily.    metoprolol tartrate (LOPRESSOR) 25 MG tablet Take 1 tablet (25 mg total) by mouth 3 (three) times daily. Qty: 1 tablet, Refills: 1    Multiple Vitamin (MULTIVITAMIN WITH MINERALS) TABS tablet Take 1 tablet by mouth daily.    oxyCODONE-acetaminophen (PERCOCET/ROXICET) 5-325 MG per tablet One to two tabs every four hours as needed for pain Qty: 30 tablet, Refills: 0    pantoprazole (PROTONIX) 40 MG tablet Take 1 tablet (40 mg total) by mouth 2 (two) times daily. Qty: 1 tablet, Refills: 1    polyethylene glycol (MIRALAX / GLYCOLAX) packet Take 17 g by mouth at bedtime.     senna-docusate (SENOKOT-S) 8.6-50 MG tablet Take 1 tablet by mouth 2 (two) times daily.    temazepam (RESTORIL) 15 MG capsule Take 1 capsule (15 mg total) by mouth at bedtime. Qty: 30 capsule, Refills: 0    docusate sodium (COLACE) 100 MG capsule Take 1 capsule (100 mg total) by mouth 2 (two) times daily. Qty: 10  capsule, Refills: 0      STOP taking these medications     diltiazem (CARDIZEM) 30 MG tablet      spironolactone (ALDACTONE) 25 MG tablet      torsemide (DEMADEX) 10 MG tablet              Discharge Medications: Please see discharge summary for a list of discharge medications.  Relevant Imaging Results:  Relevant Lab Results:  Recent Labs    Additional Information  (ZO:[id number](SS:[id number])  Soundra PilonMoore, Deborah H, LCSW

## 2015-08-20 NOTE — Progress Notes (Signed)
Initial appointment made at the [facility] on September 11, 2015 at 11:00am. Thank you.

## 2015-08-20 NOTE — [place] FL2 (Signed)
[place] MEDICAID FL2 LEVEL OF CARE SCREENING TOOL     IDENTIFICATION  Patient Name: [name] Birthdate: 02/01/1925 Sex: female Admission Date (Current Location): 08/18/2015  [place]   Facility and Address:  [facility], [address], [place], [place] [address]      Provider Number: [id number]  Attending Physician Name and Address:  Adrian SaranSital Mody, MD  Relative Name and Phone Number:       Current Level of Care: SNF Recommended Level of Care: Nursing Facility Prior Approval Number:    Date Approved/Denied:   PASRR Number:  ( [id number] )  Discharge Plan: SNF    Current Diagnoses: Patient Active Problem List   Diagnosis Date Noted  . Atrial fibrillation with RVR (HCC) 08/18/2015  . CHF (congestive heart failure) (HCC) 08/18/2015  . Ischemic leg 06/05/2015  . Ischemia of lower extremity 05/26/2015  . Aortic heart valve narrowing 05/26/2015  . Arthritis urica 05/26/2015  . H/O angina pectoris 05/26/2015  . Peripheral blood vessel disorder (HCC) 05/26/2015  . Arteriosclerosis of coronary artery 01/25/2014  . Heart failure (HCC) 01/25/2014  . Chronic kidney disease (CKD), stage III (moderate) 01/25/2014  . HLD (hyperlipidemia) 01/25/2014  . BP (high blood pressure) 01/25/2014  . Arthritis, degenerative 01/25/2014  . Hypertensive pulmonary vascular disease (HCC) 01/25/2014  . Cardiac failure right (HCC) 01/25/2014  . Diabetes mellitus, type 2 (HCC) 01/25/2014  . Herpes zona 07/14/2009    Orientation ACTIVITIES/SOCIAL BLADDER RESPIRATION    Self, Time, Situation, Place  Family supportive Incontinent (at times) O2 (As needed)  BEHAVIORAL SYMPTOMS/MOOD NEUROLOGICAL BOWEL NUTRITION STATUS      Continent    PHYSICIAN VISITS COMMUNICATION OF NEEDS Height & Weight Skin  30 days Verbally 5' (152.4 cm) 207 lbs. Normal          AMBULATORY STATUS RESPIRATION    Assist extensive O2 (As needed)       Personal Care Assistance Level of Assistance  Bathing, Dressing, Total care Bathing Assistance: Maximum assistance   Dressing Assistance: Maximum assistance Total Care Assistance: Maximum assistance    Functional Limitations Info  Hearing, Sight, Speech Sight Info: Adequate Hearing Info: Impaired Speech Info: Adequate       SPECIAL CARE FACTORS FREQUENCY  PT (By licensed PT)                   Additional Factors Info  Allergies, Insulin Sliding Scale, Code Status Code Status Info:  (DNR) Allergies Info:  (Dilaudid, Morphine And Related)           Current Medications (08/20/2015):  This is the current hospital active medication list Current Facility-Administered Medications  Medication Dose Route Frequency Provider Last Rate Last Dose  . allopurinol (ZYLOPRIM) tablet 100 mg  100 mg Oral Daily Altamese DillingVaibhavkumar Vachhani, MD   100 mg at 08/20/15 1027  . ALPRAZolam Prudy Feeler(XANAX) tablet 0.25 mg  0.25 mg Oral TID Altamese DillingVaibhavkumar Vachhani, MD   0.25 mg at 08/20/15 1027  . antiseptic oral rinse (CPC / CETYLPYRIDINIUM CHLORIDE 0.05%) solution 7 mL  7 mL Mouth Rinse BID Altamese DillingVaibhavkumar Vachhani, MD   7 mL at 08/19/15 2226  . aspirin EC tablet 81 mg  81 mg Oral Daily Altamese DillingVaibhavkumar Vachhani, MD   81 mg at 08/20/15 1026  . atorvastatin (LIPITOR) tablet 20 mg  20 mg Oral QHS Altamese DillingVaibhavkumar Vachhani, MD   20 mg at 08/19/15 2200  . azelastine (ASTELIN) 0.1 % nasal spray 2 spray  2 spray Each Nare  BID Altamese Dilling, MD   2 spray at 08/20/15 1026  . azithromycin (ZITHROMAX) 250 mg in dextrose 5 % 125 mL IVPB  250 mg Intravenous Q24H Altamese Dilling, MD 125 mL/hr at 08/19/15 1540 250 mg at 08/19/15 1540  . cefTRIAXone (ROCEPHIN) 1 g in dextrose 5 % 50 mL IVPB  1 g Intravenous Q24H Altamese Dilling, MD 100 mL/hr at 08/19/15 1502 1 g at 08/19/15 1502  . diltiazem (CARDIZEM) tablet 30 mg  30 mg Oral 4 times per day Alwyn Pea, MD   30 mg at 08/20/15 0028  . docusate sodium (COLACE)  capsule 100 mg  100 mg Oral BID Altamese Dilling, MD   100 mg at 08/20/15 1027  . escitalopram (LEXAPRO) tablet 10 mg  10 mg Oral Daily Altamese Dilling, MD   10 mg at 08/20/15 1027  . ferrous sulfate tablet 325 mg  325 mg Oral BID Altamese Dilling, MD   325 mg at 08/20/15 1026  . gabapentin (NEURONTIN) capsule 300 mg  300 mg Oral TID Altamese Dilling, MD   300 mg at 08/20/15 1027  . heparin ADULT infusion 100 units/mL (25000 units/250 mL)  950 Units/hr Intravenous Continuous Shaune Pollack, MD 9.5 mL/hr at 08/19/15 2015 950 Units/hr at 08/19/15 2015  . insulin aspart (novoLOG) injection 0-15 Units  0-15 Units Subcutaneous TID WC Shaune Pollack, MD   5 Units at 08/20/15 0825  . insulin aspart (novoLOG) injection 0-5 Units  0-5 Units Subcutaneous QHS Shaune Pollack, MD   0 Units at 08/19/15 2225  . insulin detemir (LEVEMIR) injection 42 Units  42 Units Subcutaneous QHS Shaune Pollack, MD   42 Units at 08/19/15 2225  . ipratropium-albuterol (DUONEB) 0.5-2.5 (3) MG/3ML nebulizer solution 3 mL  3 mL Nebulization Q4H Altamese Dilling, MD   3 mL at 08/20/15 0711  . isosorbide mononitrate (IMDUR) 24 hr tablet 60 mg  60 mg Oral Daily Altamese Dilling, MD   60 mg at 08/20/15 1027  . magnesium oxide (MAG-OX) tablet 400 mg  400 mg Oral Daily Altamese Dilling, MD   400 mg at 08/20/15 1027  . methylPREDNISolone sodium succinate (SOLU-MEDROL) 125 mg/2 mL injection 60 mg  60 mg Intravenous Q6H Altamese Dilling, MD   60 mg at 08/20/15 0826  . metoprolol tartrate (LOPRESSOR) tablet 25 mg  25 mg Oral TID Altamese Dilling, MD   25 mg at 08/19/15 2201  . multivitamin with minerals tablet 1 tablet  1 tablet Oral Daily Altamese Dilling, MD   1 tablet at 08/20/15 1027  . oxyCODONE-acetaminophen (PERCOCET/ROXICET) 5-325 MG per tablet 1-2 tablet  1-2 tablet Oral Q6H PRN Altamese Dilling, MD   2 tablet at 08/18/15 1419  . pantoprazole (PROTONIX) EC tablet 40 mg  40 mg Oral BID Altamese Dilling, MD   40 mg at 08/20/15 1026  . polyethylene glycol (MIRALAX / GLYCOLAX) packet 17 g  17 g Oral QHS Altamese Dilling, MD   17 g at 08/19/15 2159  . senna-docusate (Senokot-S) tablet 1 tablet  1 tablet Oral BID Altamese Dilling, MD   1 tablet at 08/20/15 1027  . sodium chloride 0.9 % injection 3 mL  3 mL Intravenous PRN Altamese Dilling, MD   3 mL at 08/19/15 2956  . sodium chloride 0.9 % injection 3 mL  3 mL Intravenous Q12H Altamese Dilling, MD   3 mL at 08/20/15 1031  . temazepam (RESTORIL) capsule 15 mg  15 mg Oral QHS Altamese Dilling, MD   15 mg at 08/19/15  2200  . tiotropium (SPIRIVA) inhalation capsule 18 mcg  18 mcg Inhalation Daily Altamese Dilling, MD   18 mcg at 08/20/15 4098     Discharge Medications: Please see discharge summary for a list of discharge medications.  Relevant Imaging Results:  Relevant Lab Results:  Recent Labs    Additional Information  ([id number])  Soundra Pilon, LCSW

## 2015-08-20 NOTE — Care Management Important Message (Signed)
Important Message  Patient Details  Name: [name] MRN: [id number] Date of Birth: [birth year]   Medicare Important Message Given:  Yes    Olegario MessierKathy A Rei Contee 08/20/2015, 1:23 PM

## 2015-08-20 NOTE — Progress Notes (Signed)
Patient was A&O X4. She remained in asymptomatic afib with HR remained>60 and <80. Patient's also has heparin infusion overnight. Patient remained hemodynamically stable with VS and HR WDL for patient. Patient was assisted during safety check and as needed.  0235: Patient's Diltiazem drip stopped per order. Will continue to monitot

## 2015-08-20 NOTE — Progress Notes (Signed)
[facility]  ROUNDING NOTE   Subjective:   Patient admitted on 08/18/15 for shortness of breath. Diagnosed with acute exacerbation of COPD and acute exacerbation of diastolic congestive heart failure.   Nephrology last saw patient for acute renal failure on chronic kidney disease on 9/16. Baseline creatinine seems to difficult to ascertain.   Hospitalization complicated by atrial fibrillation with rapid ventricular response.   Was getting furosemide 20mg  IV since admission.   However now more short of breath and having chest pain. Troponin did peak at 18.09 on 12/6. Due to respiratory status, unable to get cardiac catheterization.   Nephrology consulted due to acute renal failure with creatinine of 1.95 and potassium of 5.6  Objective:  Vital signs in last 24 hours:  Temp:  [97.9 F (36.6 C)-98.3 F (36.8 C)] 98.3 F (36.8 C) (12/07 1152) Pulse Rate:  [63-87] 77 (12/07 1548) Resp:  [18-24] 18 (12/07 1152) BP: (93-150)/(41-80) 110/51 mmHg (12/07 1548) SpO2:  [91 %-96 %] 93 % (12/07 1548) Weight:  [94.031 kg (207 lb 4.8 oz)] 94.031 kg (207 lb 4.8 oz) (12/07 0358)  Weight change: 1.043 kg (2 lb 4.8 oz) Filed Weights   08/18/15 1733 08/19/15 0507 08/20/15 0358  Weight: 93.668 kg (206 lb 8 oz) 94.031 kg (207 lb 4.8 oz) 94.031 kg (207 lb 4.8 oz)    Intake/Output: I/O last 3 completed shifts: In: 1633.5 [P.O.:480; I.V.:803.5; IV Piggyback:350] Out: 400 [Urine:400]   Intake/Output this shift:  Total I/O In: 240 [P.O.:240] Out: -   Physical Exam: General: Mild respiratory distress  Head: Normocephalic, atraumatic. Moist oral mucosal membranes  Eyes: Anicteric, PERRL  Neck: Supple, trachea midline  Lungs:  Distant lung sounds, bilateral crackles, scattered wheezes  Heart: irregular  Abdomen:  Soft, nontender,   Extremities:  1-2+ peripheral edema, left AKA  Neurologic: Nonfocal, moving all four extremities  Skin: No lesions       Basic Metabolic  Panel:  Recent Labs Lab 08/18/15 1127 08/19/15 0813 08/20/15 0432 08/20/15 1254  NA 143 137 136  --   K 4.0 5.0 5.5* 5.6*  CL 97* 94* 92*  --   CO2 38* 34* 35*  --   GLUCOSE 270* 331* 194*  --   BUN 36* 40* 47*  --   CREATININE 1.23* 1.69* 1.95*  --   CALCIUM 9.1 8.6* 8.8*  --   MG  --   --  2.3  --     Liver Function Tests: No results for input(s): AST, ALT, ALKPHOS, BILITOT, PROT, ALBUMIN in the last 168 hours. No results for input(s): LIPASE, AMYLASE in the last 168 hours. No results for input(s): AMMONIA in the last 168 hours.  CBC:  Recent Labs Lab 08/18/15 1127 08/19/15 0813 08/20/15 0432  WBC 6.5 5.3 10.2  NEUTROABS 5.1  --   --   HGB 11.8* 11.2* 11.9*  HCT 37.8 36.0 37.8  MCV 90.9 91.3 91.5  PLT 150 158 160    Cardiac Enzymes:  Recent Labs Lab 08/18/15 1127 08/18/15 1608 08/18/15 1750 08/19/15 0001  TROPONINI 0.10* 4.44* 9.19* 18.09*    BNP: Invalid input(s): POCBNP  CBG:  Recent Labs Lab 08/19/15 1612 08/19/15 2116 08/19/15 2225 08/20/15 0736 08/20/15 1154  GLUCAP 256* 207* 199* 212* 329*    Microbiology: Results for orders placed or performed during the hospital encounter of 08/18/15  MRSA PCR Screening     Status: None   Collection Time: 08/18/15  9:36 PM  Result Value Ref Range Status  MRSA by PCR NEGATIVE NEGATIVE Final    Comment:        The GeneXpert MRSA Assay (FDA approved for NASAL specimens only), is one component of a comprehensive MRSA colonization surveillance program. It is not intended to diagnose MRSA infection nor to guide or monitor treatment for MRSA infections.     Coagulation Studies: No results for input(s): LABPROT, INR in the last 72 hours.  Urinalysis: No results for input(s): COLORURINE, LABSPEC, PHURINE, GLUCOSEU, HGBUR, BILIRUBINUR, KETONESUR, PROTEINUR, UROBILINOGEN, NITRITE, LEUKOCYTESUR in the last 72 hours.  Invalid input(s): APPERANCEUR    Imaging: Dg Chest 1 View  08/20/2015   CLINICAL DATA:  Shortness of breath, history of CHF EXAM: CHEST 1 VIEW COMPARISON:  08/19/2015 FINDINGS: Cardiomegaly with mild to moderate interstitial edema. Superimposed patchy left upper lobe and right lower lobe opacities from possibly asymmetric edema, pneumonia not excluded. Suspected bilateral pleural effusions, left greater than right. Postsurgical changes related to prior CABG.  Median sternotomy. Degenerative changes of the bilateral shoulders. IMPRESSION: Cardiomegaly with mild to moderate interstitial edema. Superimposed pneumonia is not excluded. Suspected bilateral pleural effusions, left greater than right. Electronically Signed   By: Charline Bills M.D.   On: 08/20/2015 13:24   Dg Chest 2 View  08/19/2015  CLINICAL DATA:  Shortness of breath. EXAM: CHEST  2 VIEW COMPARISON:  08/18/2015. FINDINGS: Prior CABG. Cardiomegaly with pulmonary interstitial prominence again noted. Small pleural effusions noted. Base consistent with persistent congestive heart failure. No pneumothorax. IMPRESSION: Prior CABG. Persistent cardiomegaly. Persistent changes of congestive heart failure with diffuse pulmonary interstitial edema . No interim improvement. Electronically Signed   By: Maisie Fus  Register   On: 08/19/2015 07:58   Dg Chest Port 1 View  08/19/2015  CLINICAL DATA:  Left PICC line placement EXAM: PORTABLE CHEST 1 VIEW COMPARISON:  08/2015 FINDINGS: Left PICC line tip mid SVC level. Prior coronal bypass changes noted. Heart is enlarged with worsening mid and lower lung airspace process, edema is favored. Pleural effusions not excluded. Associated basilar atelectasis also evident. Upper lobes are clear. Atherosclerosis of aorta. IMPRESSION: Left PICC line tip upper SVC. Cardiomegaly with worsening airspace process, edema is favored Electronically Signed   By: Judie Petit.  Shick M.D.   On: 08/19/2015 18:42     Medications:   . heparin 950 Units/hr (08/19/15 2015)   . allopurinol  100 mg Oral Daily  .  ALPRAZolam  0.25 mg Oral TID  . antiseptic oral rinse  7 mL Mouth Rinse BID  . aspirin EC  81 mg Oral Daily  . atorvastatin  20 mg Oral QHS  . azelastine  2 spray Each Nare BID  . azithromycin  250 mg Intravenous Q24H  . cefTRIAXone (ROCEPHIN)  IV  1 g Intravenous Q24H  . diltiazem  30 mg Oral 4 times per day  . docusate sodium  100 mg Oral BID  . escitalopram  10 mg Oral Daily  . ferrous sulfate  325 mg Oral BID  . furosemide  40 mg Intravenous BID  . furosemide  80 mg Intravenous Once  . gabapentin  300 mg Oral TID  . insulin aspart  0-15 Units Subcutaneous TID WC  . insulin aspart  0-5 Units Subcutaneous QHS  . insulin detemir  42 Units Subcutaneous QHS  . ipratropium-albuterol  3 mL Nebulization Q4H  . isosorbide mononitrate  60 mg Oral Daily  . magnesium oxide  400 mg Oral Daily  . methylPREDNISolone (SOLU-MEDROL) injection  60 mg Intravenous Q6H  . metoprolol tartrate  25 mg Oral TID  . multivitamin with minerals  1 tablet Oral Daily  . pantoprazole  40 mg Oral BID  . polyethylene glycol  17 g Oral QHS  . senna-docusate  1 tablet Oral BID  . sodium chloride  3 mL Intravenous Q12H  . temazepam  15 mg Oral QHS  . tiotropium  18 mcg Inhalation Daily   oxyCODONE-acetaminophen, sodium chloride  Assessment/ Plan:  Ms. DIVYA MUNSHI is a 81 y.o. white  female with diabetes mellitus type II Insulin Dependent, coronary artery disease status post CABG, congestive heart failure diastolic echo on 16/1, left AKA, peripheral vascular disease, right humeral fracture, was admitted on 08/18/2015   1. Acute Renal Failure on chronic kidney disease stage III with hyperkalemia and proteinuria: Acute renal failure due to acute cardiorenal syndrome. Seems that her creatinine fluctuates greatly with fluid status consistent with cardiorenal syndrome. Baseline creatinine of 1.1-3.2. Most likely creatinine 2 is the baseline.  Hyperkalemia despite being off her potassium supplements. Due to renal  failure Proteinuria: no urinalysis on admission.   2. Hypertension, congestive heart failure - diastolic with acute exacerbation and atrial fibrillation with rapid ventricular response - seems to be inadequately diuresed.  - IV furosemide  now.  - Chest x-ary - breathing treatments for underlying acute exacerbation of COPD.   3. Diabetes mellitus type II with chronic kidney disease: insulin dependent. Hemoglobin A1c 6.6%.    LOS: 2 Cally Nygard 12/7/20164:16 PM

## 2015-08-20 NOTE — Progress Notes (Signed)
Pharmacy Consult for Heparin  Indication: atrial fibrillation  Allergies  Allergen Reactions  . Dilaudid [Hydromorphone Hcl] Nausea And Vomiting  . Morphine And Related Other (See Comments)    Reaction:  Lightheadedness  Patient's daughter says she can take morphine.    Patient Measurements: Height:  (149.9 cm) Weight: 207 lb 4.8 oz (94.031 kg) IBW/kg (Calculated) : 43.2 Heparin Dosing Weight: 65.7 kg   Vital Signs: Temp: 97.9 F (36.6 C) (12/07 0540) Temp Source: Oral (12/06 2000) BP: 93/61 mmHg (12/07 0540) Pulse Rate: 75 (12/07 0358)  Labs:  Recent Labs  08/18/15 1127 08/18/15 1608 08/18/15 1750  08/19/15 0001 08/19/15 0813 08/19/15 1846 08/20/15 0432  HGB 11.8*  --   --   --   --  11.2*  --  11.9*  HCT 37.8  --   --   --   --  36.0  --  37.8  PLT 150  --   --   --   --  158  --  160  HEPARINUNFRC  --   --   --   < > 0.56 0.68 0.71* 0.61  CREATININE 1.23*  --   --   --   --  1.69*  --  1.95*  TROPONINI 0.10* 4.44* 9.19*  --  18.09*  --   --   --   < > = values in this interval not displayed.  Estimated Creatinine Clearance: 20 mL/min (by C-G formula based on Cr of 1.95).   Medical History: Past Medical History  Diagnosis Date  . Cancer (HCC)   . Blood transfusion without reported diagnosis   . CHF (congestive heart failure) (HCC)   . Coronary artery disease   . Hypertension   . Shortness of breath dyspnea   . Diabetes mellitus without complication (HCC)   . Peripheral vascular disease (HCC)     Medications:  Prescriptions prior to admission  Medication Sig Dispense Refill Last Dose  . acetaminophen (TYLENOL) 325 MG tablet Take 2 tablets (650 mg total) by mouth every 6 (six) hours as needed for mild pain (or Fever >/= 101).   PRN at PRN  . allopurinol (ZYLOPRIM) 100 MG tablet Take 100 mg by mouth daily.    08/18/2015 at unknown  . ALPRAZolam (XANAX) 0.25 MG tablet Take 1 tablet (0.25 mg total) by mouth 3 (three) times daily. 30 tablet 0 08/18/2015  at unknown  . aspirin EC 81 MG tablet Take 81 mg by mouth daily.   08/18/2015 at Unknown time  . atorvastatin (LIPITOR) 20 MG tablet Take 20 mg by mouth at bedtime.   08/17/2015 at unknown  . azelastine (ASTELIN) 0.1 % nasal spray Place 2 sprays into both nostrils 2 (two) times daily. Use in each nostril as directed   08/18/2015 at unknown  . diltiazem (CARDIZEM) 30 MG tablet Take 1 tablet (30 mg total) by mouth every 8 (eight) hours.   08/18/2015 at Unknown time  . escitalopram (LEXAPRO) 5 MG tablet Take 1 tablet (5 mg total) by mouth daily. (Patient taking differently: Take 10 mg by mouth daily. )   08/18/2015 at unknown  . ferrous sulfate 325 (65 FE) MG tablet Take 325 mg by mouth 2 (two) times daily.   08/17/2015 at unknown  . furosemide (LASIX) 20 MG tablet Take 20 mg by mouth 2 (two) times daily.   08/18/2015 at unknown  . gabapentin (NEURONTIN) 300 MG capsule Take 300 mg by mouth 3 (three) times daily.   08/18/2015 at  unknown  . insulin aspart (NOVOLOG) 100 UNIT/ML injection Inject 0-15 Units into the skin 3 (three) times daily with meals. 10 mL 11 08/18/2015 at unknown  . insulin detemir (LEVEMIR) 100 UNIT/ML injection Inject 0.42 mLs (42 Units total) into the skin at bedtime. (Patient taking differently: Inject 57 Units into the skin daily. ) 10 mL 11 08/18/2015 at unknown  . ipratropium-albuterol (DUONEB) 0.5-2.5 (3) MG/3ML SOLN Take 3 mLs by nebulization every 6 (six) hours as needed (for shortness of breath).   PRN at PRN  . isosorbide mononitrate (IMDUR) 60 MG 24 hr tablet Take 60 mg by mouth daily.   08/18/2015 at unknown  . magnesium oxide (MAG-OX) 400 MG tablet Take 400 mg by mouth daily.   08/17/2015 at unknown  . metoprolol tartrate (LOPRESSOR) 25 MG tablet Take 1 tablet (25 mg total) by mouth 3 (three) times daily. 1 tablet 1 08/18/2015 at unknown  . Multiple Vitamin (MULTIVITAMIN WITH MINERALS) TABS tablet Take 1 tablet by mouth daily.   08/17/2015 at unknown  . oxyCODONE-acetaminophen  (PERCOCET/ROXICET) 5-325 MG per tablet One to two tabs every four hours as needed for pain (Patient taking differently: Take 1-2 tablets by mouth every 4 (four) hours as needed for severe pain. ) 30 tablet 0 PRN at PRN  . pantoprazole (PROTONIX) 40 MG tablet Take 1 tablet (40 mg total) by mouth 2 (two) times daily. 1 tablet 1 08/18/2015 at unknown  . polyethylene glycol (MIRALAX / GLYCOLAX) packet Take 17 g by mouth at bedtime.    08/17/2015 at unknown  . senna-docusate (SENOKOT-S) 8.6-50 MG tablet Take 1 tablet by mouth 2 (two) times daily.   08/18/2015 at unknown  . spironolactone (ALDACTONE) 25 MG tablet Take 1 tablet (25 mg total) by mouth daily. 1 tablet 1 08/18/2015 at unknown  . temazepam (RESTORIL) 15 MG capsule Take 1 capsule (15 mg total) by mouth at bedtime. 30 capsule 0 08/17/2015 at unknown  . docusate sodium (COLACE) 100 MG capsule Take 1 capsule (100 mg total) by mouth 2 (two) times daily. (Patient not taking: Reported on 08/18/2015) 10 capsule 0 unknown at unknown  . torsemide (DEMADEX) 10 MG tablet Take 1 tablet (10 mg total) by mouth daily. (Patient not taking: Reported on 08/18/2015) 1 tablet 1     Assessment: Pharmacy consulted to dose heparin in this 81 year old female admitted with Afib.  No prior anticoag noted.   HL= 0.68  Goal of Therapy:  Heparin level 0.3-0.7 units/ml Monitor platelets by anticoagulation protocol: Yes   Plan:  Will continue Heparin drip at 1000 units/hr. Will check another HL with AM labs. Will check CBC daily. 12/06:  HL @ 17:00 = 0.71 Will decrease heparin gtt to 950 units/hr and recheck HL 8 hr after rate change on 12/7 @ 04:00.   1207 0432 heparin level therapeutic. Continue current rate, will recheck/confirm level in 8 hours.  Carola FrostNathan A Amaiya Scruton, Pharm.D., BCPS Clinical Pharmacist 08/20/2015 5:44 AM

## 2015-08-20 NOTE — Discharge Instructions (Signed)
Heart Failure Clinic appointment on September 11, 2015 at 11:00am with Tracy Kindredina [name], FNP. Please call [phone] to reschedule.

## 2015-08-21 LAB — BASIC METABOLIC PANEL
ANION GAP: 7 (ref 5–15)
BUN: 62 mg/dL — ABNORMAL HIGH (ref 6–20)
CALCIUM: 8.4 mg/dL — AB (ref 8.9–10.3)
CO2: 37 mmol/L — ABNORMAL HIGH (ref 22–32)
Chloride: 92 mmol/L — ABNORMAL LOW (ref 101–111)
Creatinine, Ser: 2.03 mg/dL — ABNORMAL HIGH (ref 0.44–1.00)
GFR, EST AFRICAN AMERICAN: 24 mL/min — AB (ref >60)
GFR, EST NON AFRICAN AMERICAN: 21 mL/min — AB (ref >60)
Glucose, Bld: 174 mg/dL — ABNORMAL HIGH (ref 65–99)
Potassium: 5.3 mmol/L — ABNORMAL HIGH (ref 3.5–5.1)
Sodium: 136 mmol/L (ref 135–145)

## 2015-08-21 LAB — URINALYSIS COMPLETE WITH MICROSCOPIC (ARMC ONLY)
BACTERIA UA: NONE SEEN
BILIRUBIN URINE: NEGATIVE
Glucose, UA: NEGATIVE mg/dL
KETONES UR: NEGATIVE mg/dL
Leukocytes, UA: NEGATIVE
NITRITE: NEGATIVE
PROTEIN: NEGATIVE mg/dL
Specific Gravity, Urine: 1.01 (ref 1.005–1.030)
pH: 5 (ref 5.0–8.0)

## 2015-08-21 LAB — CBC
HCT: 37.4 % (ref 35.0–47.0)
HEMOGLOBIN: 12.1 g/dL (ref 12.0–16.0)
MCH: 29.5 pg (ref 26.0–34.0)
MCHC: 32.3 g/dL (ref 32.0–36.0)
MCV: 91.5 fL (ref 80.0–100.0)
Platelets: 145 10*3/uL — ABNORMAL LOW (ref 150–440)
RBC: 4.08 MIL/uL (ref 3.80–5.20)
RDW: 18.7 % — ABNORMAL HIGH (ref 11.5–14.5)
WBC: 8.1 10*3/uL (ref 3.6–11.0)

## 2015-08-21 LAB — GLUCOSE, CAPILLARY
GLUCOSE-CAPILLARY: 161 mg/dL — AB (ref 65–99)
GLUCOSE-CAPILLARY: 198 mg/dL — AB (ref 65–99)
GLUCOSE-CAPILLARY: 217 mg/dL — AB (ref 65–99)
Glucose-Capillary: 227 mg/dL — ABNORMAL HIGH (ref 65–99)

## 2015-08-21 LAB — HEPARIN LEVEL (UNFRACTIONATED): HEPARIN UNFRACTIONATED: 0.66 [IU]/mL (ref 0.30–0.70)

## 2015-08-21 MED ORDER — METHYLPREDNISOLONE SODIUM SUCC 40 MG IJ SOLR
40.0000 mg | Freq: Two times a day (BID) | INTRAMUSCULAR | Status: DC
  Administered 2015-08-21 – 2015-08-23 (×4): 40 mg via INTRAVENOUS
  Filled 2015-08-21 (×4): qty 1

## 2015-08-21 MED ORDER — AZITHROMYCIN 250 MG PO TABS
250.0000 mg | ORAL_TABLET | Freq: Every day | ORAL | Status: AC
  Administered 2015-08-21 – 2015-08-24 (×4): 250 mg via ORAL
  Filled 2015-08-21 (×4): qty 1

## 2015-08-21 MED ORDER — IPRATROPIUM-ALBUTEROL 0.5-2.5 (3) MG/3ML IN SOLN: 3.0000 mL | RESPIRATORY_TRACT | Status: DC | PRN

## 2015-08-21 NOTE — Progress Notes (Signed)
Inpatient Diabetes Program Recommendations  AACE/ADA: New Consensus Statement on Inpatient Glycemic Control (2015)  Target Ranges:  Prepandial:   less than 140 mg/dL      Peak postprandial:   less than 180 mg/dL (1-2 hours)      Critically ill patients:  140 - 180 mg/dL  Results for [name], [name] (MRN [id number]) as of 08/21/2015 11:13  Ref. Range 08/20/2015 07:36 08/20/2015 11:54 08/20/2015 16:47 08/20/2015 21:41 08/21/2015 07:31  Glucose-Capillary Latest Ref Range: 65-99 mg/dL 409212 (Porter)  5 units 811329 (Porter)  11 units 283 (Porter)  8 units 230 (Porter)  2 units 161 (Porter)  3 units   Review of Glycemic Control  Diabetes history: DM2 Outpatient Diabetes medications: Levemir 57 units daily, Novolog 0-15 units TID with meals Current orders for Inpatient glycemic control: Levemir 42 units QHS, Novolog 0-15 units TID with meals, Novolog 0-5 units HS  Inpatient Diabetes Program Recommendations: Insulin - Basal: Please consider increasing Levemir to 44 untis QHS. Insulin - Meal Coverage: While order steroids, please consider ordering Novolog 5 units TID with meals for meal coverage (in addition to Novolog correction scale).   Thanks, Orlando PennerMarie Breane Grunwald, RN, MSN, CDE Diabetes Coordinator Inpatient Diabetes Program [phone] (Team Pager from 8am to 5pm) [phone] ([facility] office) [phone] [facility] office) [phone] [facility] office)

## 2015-08-21 NOTE — Progress Notes (Signed)
Pharmacy Consult for Heparin  Indication: atrial fibrillation  Allergies  Allergen Reactions  . Dilaudid [Hydromorphone Hcl] Nausea And Vomiting  . Morphine And Related Other (See Comments)    Reaction:  Lightheadedness  Patient's daughter says she can take morphine.    Patient Measurements: Height: 4\' 11"  (149.9 cm) Weight: 209 lb 14.4 oz (95.21 kg) IBW/kg (Calculated) : 43.2 Heparin Dosing Weight: 65.7 kg   Vital Signs: Temp: 98.4 F (36.9 C) (12/08 0537) Temp Source: Oral (12/08 0537) BP: 123/83 mmHg (12/08 0537) Pulse Rate: 73 (12/08 0537)  Labs:  Recent Labs  08/18/15 1608 08/18/15 1750  08/19/15 0001 08/19/15 0813  08/20/15 0432 08/20/15 1254 08/21/15 0621 08/21/15 0716  HGB  --   --   --   --  11.2*  --  11.9*  --  12.1  --   HCT  --   --   --   --  36.0  --  37.8  --  37.4  --   PLT  --   --   --   --  158  --  160  --  145*  --   HEPARINUNFRC  --   --   < > 0.56 0.68  < > 0.61 0.65  --  0.66  CREATININE  --   --   --   --  1.69*  --  1.95*  --  2.03*  --   TROPONINI 4.44* 9.19*  --  18.09*  --   --   --   --   --   --   < > = values in this interval not displayed.  Estimated Creatinine Clearance: 19.4 mL/min (by C-G formula based on Cr of 2.03).   Assessment: Pharmacy consulted to dose heparin in this 81 year old female admitted with Afib.  No prior anticoag noted.   HL= 0.68  Goal of Therapy:  Heparin level 0.3-0.7 units/ml Monitor platelets by anticoagulation protocol: Yes   Plan:  Current orders for heparin 950units/hr. Heparin level therapeutic this am, continue current rate. Will recheck HL and CBC with AM labs  Pharmacy to follow per consult  Garlon HatchetJody Mansur Patti, PharmD Clinical Pharmacist  08/21/2015 9:01 AM

## 2015-08-21 NOTE — Progress Notes (Signed)
PHARMACIST - PHYSICIAN COMMUNICATION CONCERNING: Antibiotic IV to Oral Route Change Policy  RECOMMENDATION: This patient is receiving azithromycin by the intravenous route.  Based on criteria approved by the Pharmacy and Therapeutics Committee, the antibiotic(s) is/are being converted to the equivalent oral dose form(s).   DESCRIPTION: These criteria include:  Patient being treated for a respiratory tract infection, urinary tract infection, cellulitis or clostridium difficile associated diarrhea if on metronidazole  The patient is not neutropenic and does not exhibit a GI malabsorption state  The patient is eating (either orally or via tube) and/or has been taking other orally administered medications for a least 24 hours  The patient is improving clinically and has a Tmax < 100.5  If you have questions about this conversion, please contact the Pharmacy Department   Garlon HatchetJody Nafeesa Dils, PharmD Clinical Pharmacist  08/21/2015 11:17 AM

## 2015-08-21 NOTE — Progress Notes (Signed)
[facility] - [place] at [facility]   PATIENT NAME: [name]    MR#:  [id number]  DATE OF BIRTH:  October 29, 1924  SUBJECTIVE:   Yesterday afternoon patient had increased work of breathing and was feeling more short of breath. She was given 80 IV of Lasix and her symptoms have improved. She is currently on 4 L of oxygen and has no complaints this morning. Shortness of breath and wheezing have improved.  REVIEW OF SYSTEMS:    Review of Systems  Constitutional: Negative for fever, chills and malaise/fatigue.  HENT: Negative for sore throat.   Eyes: Negative for blurred vision.  Respiratory: Positive for cough, shortness of breath and wheezing. Negative for hemoptysis and sputum production.   Cardiovascular: Negative for chest pain, palpitations and leg swelling.  Gastrointestinal: Negative for nausea, vomiting, abdominal pain, diarrhea and blood in stool.  Genitourinary: Negative for dysuria.  Musculoskeletal: Negative for back pain.  Neurological: Positive for weakness. Negative for dizziness, tremors and headaches.  Endo/Heme/Allergies: Does not bruise/bleed easily.    Tolerating Diet:yes      DRUG ALLERGIES:   Allergies  Allergen Reactions  . Dilaudid [Hydromorphone Hcl] Nausea And Vomiting  . Morphine And Related Other (See Comments)    Reaction:  Lightheadedness  Patient's daughter says she can take morphine.    VITALS:  Blood pressure 113/59, pulse 73, temperature 98.5 F (36.9 C), temperature source Oral, resp. rate 20, height  (1.499 m), weight 95.21 kg (209 lb 14.4 oz), SpO2 99 %.  PHYSICAL EXAMINATION:   Physical Exam  Constitutional: She is oriented to person, place, and time and well-developed, well-nourished, and in no distress. No distress.  HENT:  Head: Normocephalic.  Eyes: No scleral icterus.  Neck: Normal range of motion. Neck supple. No JVD present. No tracheal deviation present.  Cardiovascular: Normal rate and  regular rhythm.  Exam reveals no gallop and no friction rub.   Murmur heard. Pulmonary/Chest: Effort normal. No respiratory distress. She has wheezes. She has no rales. She exhibits no tenderness.  Tight   Abdominal: Soft. Bowel sounds are normal. She exhibits no distension and no mass. There is no tenderness. There is no rebound and no guarding.  Musculoskeletal: Normal range of motion. She exhibits no edema.  Neurological: She is alert and oriented to person, place, and time.  Skin: Skin is warm. No rash noted. No erythema.  Psychiatric: Affect and judgment normal.      LABORATORY PANEL:   CBC  Recent Labs Lab 08/21/15 0621  WBC 8.1  HGB 12.1  HCT 37.4  PLT 145*   ------------------------------------------------------------------------------------------------------------------  Chemistries   Recent Labs Lab 08/20/15 0432  08/21/15 0621  NA 136  --  136  K 5.5*  < > 5.3*  CL 92*  --  92*  CO2 35*  --  37*  GLUCOSE 194*  --  174*  BUN 47*  --  62*  CREATININE 1.95*  --  2.03*  CALCIUM 8.8*  --  8.4*  MG 2.3  --   --   < > = values in this interval not displayed. ------------------------------------------------------------------------------------------------------------------  Cardiac Enzymes  Recent Labs Lab 08/18/15 1608 08/18/15 1750 08/19/15 0001  TROPONINI 4.44* 9.19* 18.09*   ------------------------------------------------------------------------------------------------------------------  RADIOLOGY:  Dg Chest 1 View  08/20/2015  CLINICAL DATA:  Shortness of breath, history of CHF EXAM: CHEST 1 VIEW COMPARISON:  08/19/2015 FINDINGS: Cardiomegaly with mild to moderate interstitial edema. Superimposed patchy left upper lobe and right lower lobe  opacities from possibly asymmetric edema, pneumonia not excluded. Suspected bilateral pleural effusions, left greater than right. Postsurgical changes related to prior CABG.  Median sternotomy. Degenerative changes  of the bilateral shoulders. IMPRESSION: Cardiomegaly with mild to moderate interstitial edema. Superimposed pneumonia is not excluded. Suspected bilateral pleural effusions, left greater than right. Electronically Signed   By: Charline BillsSriyesh  Krishnan M.D.   On: 08/20/2015 13:24   Dg Chest Port 1 View  08/20/2015  CLINICAL DATA:  Worsening shortness of breath. Wheezing. Congestive heart failure. Atrial fibrillation with rapid ventricular response. EXAM: PORTABLE CHEST 1 VIEW COMPARISON:  08/20/2015 FINDINGS: Left arm PICC line remains in appropriate position. Cardiomegaly stable. Bilateral mid and lower lung airspace disease shows no significant change. Left greater than right pleural effusions are again seen, as well as left retrocardiac consolidation or collapse. These findings are also stable. No pneumothorax visualized.  Previous CABG noted. IMPRESSION: No significant interval change since prior exam earlier today . Electronically Signed   By: Myles RosenthalJohn  Stahl M.D.   On: 08/20/2015 17:19   Dg Chest Port 1 View  08/19/2015  CLINICAL DATA:  Left PICC line placement EXAM: PORTABLE CHEST 1 VIEW COMPARISON:  08/2015 FINDINGS: Left PICC line tip mid SVC level. Prior coronal bypass changes noted. Heart is enlarged with worsening mid and lower lung airspace process, edema is favored. Pleural effusions not excluded. Associated basilar atelectasis also evident. Upper lobes are clear. Atherosclerosis of aorta. IMPRESSION: Left PICC line tip upper SVC. Cardiomegaly with worsening airspace process, edema is favored Electronically Signed   By: Judie PetitM.  Shick M.D.   On: 08/19/2015 18:42     ASSESSMENT AND PLAN:    [age] year old female with a history of coronary artery disease and diastolic heart failure who presented with shortness of breath and atrial fibrillation with RVR.  1. Atrial fibrillation with RVR: Patient's heart rates have now improved. Continue metoprolol and diltiazem. She is currently on heparin drip for non-ST  elevation .  2. Non-ST elevation: Appreciate cardiology consultation. Due to patient's respiratory status she is unable to undergo cardiac catheterization. Continue heparin drip, Imdur, metoprolol, aspirin and atorvastatin.  3. Acute on chronic diastolic heart failure: Patient's ejection fraction was 50-55%. She has moderate TR on echocardiogram. There is no mention of aortic stenosis. Continue Lasix IV. I suspect she became fluid overloaded from the elevation in her heart rate.  4. Acute on chronic hypoxic respiratory failure: This is likely secondary to the circumflex heart failure along with atrial fibrillation. She continues have increased O2 need. Her chest x-ray yesterday does show pulmonary edema. I will repeat chest x-ray tomorrow.  6. COPD exacerbation: Wean IV steroids and continue nebulizers and inhalers. continue Rocephin and azithromycin through Sunday. 7. Diabetes: Continue Levemir and sliding scale insulin. A1c 6.6   8. Acute on chronic kidney disease stage III with hyperkalemia and proteinuria: Acute renal failure secondary to acute cardiorenal syndrome. As per nephrology consultation creatinine fluctuates between 1.1-3.2. Most likely baseline creatinine is 2. Hold nephrotoxic agents. Continue to monitor creatinine.  9. Hyperkalemia: Avoid potassium supplements. Potassium is elevated in the setting of renal failure.   Management plans discussed with the patient and she is in agreement.  CODE STATUS: FULL  TOTAL TIME TAKING CARE OF THIS PATIENT: 25 minutes.     POSSIBLE D/C 3-4 days, DEPENDING ON CLINICAL CONDITION.   Kenika Sahm M.D on 08/21/2015 at 12:00 PM  Between 7am to 6pm - Pager - [phone] After 6pm go to [email or web address] - password [email or web address]  [facility]  Office  [phone]  CC: Primary care physician; Danella Penton., MD  Note: This dictation was prepared with Dragon dictation along with smaller phrase technology. Any transcriptional  errors that result from this process are unintentional.

## 2015-08-21 NOTE — Progress Notes (Signed)
[facility]  ROUNDING NOTE   Subjective:   Furosemide IV  given yesterday due to respiratory distress. Breathing better today.   Creatinine 2.03 (1.95) K 5.3 (5.6)  Objective:  Vital signs in last 24 hours:  Temp:  [97.9 F (36.6 C)-98.5 F (36.9 C)] 98.5 F (36.9 C) (12/08 0900) Pulse Rate:  [63-87] 74 (12/08 0900) Resp:  [18-22] 20 (12/08 0900) BP: (96-135)/(41-83) 118/68 mmHg (12/08 0900) SpO2:  [89 %-98 %] 95 % (12/08 0900) Weight:  [95.21 kg (209 lb 14.4 oz)] 95.21 kg (209 lb 14.4 oz) (12/08 0537)  Weight change: 1.179 kg (2 lb 9.6 oz) Filed Weights   08/19/15 0507 08/20/15 0358 08/21/15 0537  Weight: 94.031 kg (207 lb 4.8 oz) 94.031 kg (207 lb 4.8 oz) 95.21 kg (209 lb 14.4 oz)    Intake/Output: I/O last 3 completed shifts: In: 937.8 [P.O.:360; I.V.:402.8; IV Piggyback:175] Out: 850 [Urine:850]   Intake/Output this shift:     Physical Exam: General: Mild respiratory distress  Head: Normocephalic, atraumatic. Moist oral mucosal membranes  Eyes: Anicteric, PERRL  Neck: Supple, trachea midline  Lungs:  Bibasilar crackles  Heart: irregular  Abdomen:  Soft, nontender,   Extremities:  trace peripheral edema, left AKA  Neurologic: Nonfocal, moving all four extremities  Skin: No lesions       Basic Metabolic Panel:  Recent Labs Lab 08/18/15 1127 08/19/15 0813 08/20/15 0432 08/20/15 1254 08/21/15 0621  NA 143 137 136  --  136  K 4.0 5.0 5.5* 5.6* 5.3*  CL 97* 94* 92*  --  92*  CO2 38* 34* 35*  --  37*  GLUCOSE 270* 331* 194*  --  174*  BUN 36* 40* 47*  --  62*  CREATININE 1.23* 1.69* 1.95*  --  2.03*  CALCIUM 9.1 8.6* 8.8*  --  8.4*  MG  --   --  2.3  --   --     Liver Function Tests: No results for input(s): AST, ALT, ALKPHOS, BILITOT, PROT, ALBUMIN in the last 168 hours. No results for input(s): LIPASE, AMYLASE in the last 168 hours. No results for input(s): AMMONIA in the last 168 hours.  CBC:  Recent Labs Lab  08/18/15 1127 08/19/15 0813 08/20/15 0432 08/21/15 0621  WBC 6.5 5.3 10.2 8.1  NEUTROABS 5.1  --   --   --   HGB 11.8* 11.2* 11.9* 12.1  HCT 37.8 36.0 37.8 37.4  MCV 90.9 91.3 91.5 91.5  PLT 150 158 160 145*    Cardiac Enzymes:  Recent Labs Lab 08/18/15 1127 08/18/15 1608 08/18/15 1750 08/19/15 0001  TROPONINI 0.10* 4.44* 9.19* 18.09*    BNP: Invalid input(s): POCBNP  CBG:  Recent Labs Lab 08/20/15 0736 08/20/15 1154 08/20/15 1647 08/20/15 2141 08/21/15 0731  GLUCAP 212* 329* 283* 230* 161*    Microbiology: Results for orders placed or performed during the hospital encounter of 08/18/15  MRSA PCR Screening     Status: None   Collection Time: 08/18/15  9:36 PM  Result Value Ref Range Status   MRSA by PCR NEGATIVE NEGATIVE Final    Comment:        The GeneXpert MRSA Assay (FDA approved for NASAL specimens only), is one component of a comprehensive MRSA colonization surveillance program. It is not intended to diagnose MRSA infection nor to guide or monitor treatment for MRSA infections.     Coagulation Studies: No results for input(s): LABPROT, INR in the last 72 hours.  Urinalysis: No results for  input(s): COLORURINE, LABSPEC, PHURINE, GLUCOSEU, HGBUR, BILIRUBINUR, KETONESUR, PROTEINUR, UROBILINOGEN, NITRITE, LEUKOCYTESUR in the last 72 hours.  Invalid input(s): APPERANCEUR    Imaging: Dg Chest 1 View  08/20/2015  CLINICAL DATA:  Shortness of breath, history of CHF EXAM: CHEST 1 VIEW COMPARISON:  08/19/2015 FINDINGS: Cardiomegaly with mild to moderate interstitial edema. Superimposed patchy left upper lobe and right lower lobe opacities from possibly asymmetric edema, pneumonia not excluded. Suspected bilateral pleural effusions, left greater than right. Postsurgical changes related to prior CABG.  Median sternotomy. Degenerative changes of the bilateral shoulders. IMPRESSION: Cardiomegaly with mild to moderate interstitial edema. Superimposed  pneumonia is not excluded. Suspected bilateral pleural effusions, left greater than right. Electronically Signed   By: Charline BillsSriyesh  Krishnan M.D.   On: 08/20/2015 13:24   Dg Chest Port 1 View  08/20/2015  CLINICAL DATA:  Worsening shortness of breath. Wheezing. Congestive heart failure. Atrial fibrillation with rapid ventricular response. EXAM: PORTABLE CHEST 1 VIEW COMPARISON:  08/20/2015 FINDINGS: Left arm PICC line remains in appropriate position. Cardiomegaly stable. Bilateral mid and lower lung airspace disease shows no significant change. Left greater than right pleural effusions are again seen, as well as left retrocardiac consolidation or collapse. These findings are also stable. No pneumothorax visualized.  Previous CABG noted. IMPRESSION: No significant interval change since prior exam earlier today . Electronically Signed   By: Myles RosenthalJohn  Stahl M.D.   On: 08/20/2015 17:19   Dg Chest Port 1 View  08/19/2015  CLINICAL DATA:  Left PICC line placement EXAM: PORTABLE CHEST 1 VIEW COMPARISON:  08/2015 FINDINGS: Left PICC line tip mid SVC level. Prior coronal bypass changes noted. Heart is enlarged with worsening mid and lower lung airspace process, edema is favored. Pleural effusions not excluded. Associated basilar atelectasis also evident. Upper lobes are clear. Atherosclerosis of aorta. IMPRESSION: Left PICC line tip upper SVC. Cardiomegaly with worsening airspace process, edema is favored Electronically Signed   By: Judie PetitM.  Shick M.D.   On: 08/19/2015 18:42     Medications:   . heparin 950 Units/hr (08/20/15 1731)   . allopurinol  100 mg Oral Daily  . ALPRAZolam  0.25 mg Oral TID  . aspirin EC  81 mg Oral Daily  . atorvastatin  20 mg Oral QHS  . azelastine  2 spray Each Nare BID  . azithromycin  250 mg Intravenous Q24H  . cefTRIAXone (ROCEPHIN)  IV  1 g Intravenous Q24H  . diltiazem  30 mg Oral 4 times per day  . docusate sodium  100 mg Oral BID  . escitalopram  10 mg Oral Daily  . ferrous  sulfate  325 mg Oral BID  . furosemide  40 mg Intravenous BID  . gabapentin  300 mg Oral TID  . insulin aspart  0-15 Units Subcutaneous TID WC  . insulin aspart  0-5 Units Subcutaneous QHS  . insulin detemir  42 Units Subcutaneous QHS  . ipratropium-albuterol  3 mL Nebulization Q4H  . isosorbide mononitrate  60 mg Oral Daily  . magnesium oxide  400 mg Oral Daily  . methylPREDNISolone (SOLU-MEDROL) injection  60 mg Intravenous Q6H  . metoprolol tartrate  25 mg Oral TID  . multivitamin with minerals  1 tablet Oral Daily  . pantoprazole  40 mg Oral BID  . polyethylene glycol  17 g Oral QHS  . senna-docusate  1 tablet Oral BID  . temazepam  15 mg Oral QHS  . tiotropium  18 mcg Inhalation Daily   oxyCODONE-acetaminophen, sodium chloride  Assessment/  Plan:  [name] is a 81 y.o. white  female with diabetes mellitus type II Insulin Dependent, coronary artery disease status post CABG, congestive heart failure diastolic echo on 16/1, left AKA, peripheral vascular disease, right humeral fracture, was admitted on 08/18/2015   1. Acute Renal Failure on chronic kidney disease stage III with hyperkalemia and proteinuria: Acute renal failure due to acute cardiorenal syndrome. Seems that her creatinine fluctuates greatly with fluid status consistent with cardiorenal syndrome. Creatinine readings of 1.1-3.2. Most likely creatinine 2 is the baseline.  Hyperkalemia despite being off her potassium supplements. Due to renal failure. Avoid potassium supplements Proteinuria: no urinalysis on admission. - ordered  2. Hypertension, congestive heart failure - diastolic with acute exacerbation and atrial fibrillation with rapid ventricular response - seems to be inadequately diuresed.  - IV furosemide  ordered, yet to be given this morning.    3. Diabetes mellitus type II with chronic kidney disease: insulin dependent. Hemoglobin A1c 6.6%.    LOS: 3 Heiress Williamson 12/8/20169:41 AM

## 2015-08-22 ENCOUNTER — Inpatient Hospital Stay: Payer: Medicare Other

## 2015-08-22 LAB — BASIC METABOLIC PANEL
Anion gap: 8 (ref 5–15)
BUN: 74 mg/dL — AB (ref 6–20)
CHLORIDE: 91 mmol/L — AB (ref 101–111)
CO2: 39 mmol/L — AB (ref 22–32)
CREATININE: 2.24 mg/dL — AB (ref 0.44–1.00)
Calcium: 8.3 mg/dL — ABNORMAL LOW (ref 8.9–10.3)
GFR calc Af Amer: 21 mL/min — ABNORMAL LOW (ref >60)
GFR calc non Af Amer: 18 mL/min — ABNORMAL LOW (ref >60)
GLUCOSE: 169 mg/dL — AB (ref 65–99)
Potassium: 5.3 mmol/L — ABNORMAL HIGH (ref 3.5–5.1)
Sodium: 138 mmol/L (ref 135–145)

## 2015-08-22 LAB — CBC
HEMATOCRIT: 36.5 % (ref 35.0–47.0)
HEMOGLOBIN: 11.3 g/dL — AB (ref 12.0–16.0)
MCH: 28.3 pg (ref 26.0–34.0)
MCHC: 30.9 g/dL — AB (ref 32.0–36.0)
MCV: 91.4 fL (ref 80.0–100.0)
Platelets: 152 10*3/uL (ref 150–440)
RBC: 4 MIL/uL (ref 3.80–5.20)
RDW: 18.2 % — AB (ref 11.5–14.5)
WBC: 7.1 10*3/uL (ref 3.6–11.0)

## 2015-08-22 LAB — GLUCOSE, CAPILLARY
GLUCOSE-CAPILLARY: 160 mg/dL — AB (ref 65–99)
Glucose-Capillary: 210 mg/dL — ABNORMAL HIGH (ref 65–99)
Glucose-Capillary: 160 mg/dL — ABNORMAL HIGH (ref 65–99)
Glucose-Capillary: 229 mg/dL — ABNORMAL HIGH (ref 65–99)

## 2015-08-22 LAB — LACTATE DEHYDROGENASE, PLEURAL OR PERITONEAL FLUID: LD, Fluid: 45 U/L — ABNORMAL HIGH (ref 3–23)

## 2015-08-22 LAB — BLOOD GAS, ARTERIAL
ALLENS TEST (PASS/FAIL): POSITIVE — AB
Acid-Base Excess: 12.7 mmol/L — ABNORMAL HIGH (ref 0.0–3.0)
BICARBONATE: 41.8 meq/L — AB (ref 21.0–28.0)
FIO2: 0.28
O2 Saturation: 86.4 %
PH ART: 7.31 — AB (ref 7.350–7.450)
PO2 ART: 57 mmHg — AB (ref 83.0–108.0)
Patient temperature: 37
pCO2 arterial: 83 mmHg (ref 32.0–48.0)

## 2015-08-22 LAB — BODY FLUID CELL COUNT WITH DIFFERENTIAL
EOS FL: 0 %
LYMPHS FL: 63 %
MONOCYTE-MACROPHAGE-SEROUS FLUID: 8 %
Neutrophil Count, Fluid: 29 %
Other Cells, Fluid: 0 %
WBC FLUID: 120 uL

## 2015-08-22 LAB — CULTURE, BLOOD (ROUTINE X 2): CULTURE: NO GROWTH

## 2015-08-22 LAB — TROPONIN I
Troponin I: 4.37 ng/mL — ABNORMAL HIGH (ref <0.031)
Troponin I: 2.77 ng/mL — ABNORMAL HIGH (ref <0.031)
Troponin I: 3.61 ng/mL — ABNORMAL HIGH (ref <0.031)

## 2015-08-22 LAB — HEPARIN LEVEL (UNFRACTIONATED): Heparin Unfractionated: 1.07 IU/mL — ABNORMAL HIGH (ref 0.30–0.70)

## 2015-08-22 MED ORDER — FUROSEMIDE 10 MG/ML IJ SOLN: 40.0000 mg | Freq: Every day | INTRAMUSCULAR | Status: DC

## 2015-08-22 MED ORDER — HEPARIN (PORCINE) IN NACL 100-0.45 UNIT/ML-% IJ SOLN
750.0000 [IU]/h | INTRAMUSCULAR | Status: DC
  Administered 2015-08-22: 750 [IU]/h via INTRAVENOUS

## 2015-08-22 MED ORDER — GABAPENTIN 300 MG PO CAPS
300.0000 mg | ORAL_CAPSULE | Freq: Two times a day (BID) | ORAL | Status: DC
  Administered 2015-08-22 – 2015-08-24 (×4): 300 mg via ORAL
  Filled 2015-08-22 (×4): qty 1

## 2015-08-22 MED ORDER — DILTIAZEM HCL ER COATED BEADS 120 MG PO CP24
120.0000 mg | ORAL_CAPSULE | Freq: Every day | ORAL | Status: DC
  Administered 2015-08-22 – 2015-08-24 (×3): 120 mg via ORAL
  Filled 2015-08-22 (×3): qty 1

## 2015-08-22 MED ORDER — FUROSEMIDE 10 MG/ML IJ SOLN
40.0000 mg | Freq: Two times a day (BID) | INTRAMUSCULAR | Status: DC
  Administered 2015-08-22 – 2015-08-24 (×4): 40 mg via INTRAVENOUS
  Filled 2015-08-22 (×4): qty 4

## 2015-08-22 NOTE — Care Management (Signed)
Pulmonary consult and recommends drainage of left pleural effusion that has increased in size.

## 2015-08-22 NOTE — Progress Notes (Signed)
Dr. Juliene PinaMody made aware of troponin 4.37, no new orders received.

## 2015-08-22 NOTE — Progress Notes (Signed)
[facility]  ROUNDING NOTE   Subjective:   Furosemide IV 40mg  q12 - now with metabolic alkalosis Creatinine 1.612.24 (2.03) (1.95) K 5.3 (5.3) (5.6)  Objective:  Vital signs in last 24 hours:  Temp:  [97.5 F (36.4 C)-97.6 F (36.4 C)] 97.6 F (36.4 C) (12/09 0802) Pulse Rate:  [62-73] 65 (12/09 0802) Resp:  [20-22] 22 (12/08 1953) BP: (100-115)/(46-72) 111/55 mmHg (12/09 0802) SpO2:  [95 %-99 %] 98 % (12/09 0802) Weight:  [95.255 kg (210 lb)] 95.255 kg (210 lb) (12/09 0522)  Weight change: 0.045 kg (1.6 oz) Filed Weights   08/20/15 0358 08/21/15 0537 08/22/15 0522  Weight: 94.031 kg (207 lb 4.8 oz) 95.21 kg (209 lb 14.4 oz) 95.255 kg (210 lb)    Intake/Output: I/O last 3 completed shifts: In: 467.4 [P.O.:240; I.V.:227.4] Out: 1050 [Urine:1050]   Intake/Output this shift:  Total I/O In: 3 [I.V.:3] Out: 0   Physical Exam: General: NAD  Head: Normocephalic, atraumatic. Moist oral mucosal membranes  Eyes: Anicteric, PERRL  Neck: Supple, trachea midline  Lungs:  Bibasilar crackles  Heart: irregular  Abdomen:  Soft, nontender, obese  Extremities:  trace peripheral edema, left AKA  Neurologic: Nonfocal, moving all four extremities  Skin: No lesions       Basic Metabolic Panel:  Recent Labs Lab 08/18/15 1127 08/19/15 0813 08/20/15 0432 08/20/15 1254 08/21/15 0621 08/22/15 0531  NA 143 137 136  --  136 138  K 4.0 5.0 5.5* 5.6* 5.3* 5.3*  CL 97* 94* 92*  --  92* 91*  CO2 38* 34* 35*  --  37* 39*  GLUCOSE 270* 331* 194*  --  174* 169*  BUN 36* 40* 47*  --  62* 74*  CREATININE 1.23* 1.69* 1.95*  --  2.03* 2.24*  CALCIUM 9.1 8.6* 8.8*  --  8.4* 8.3*  MG  --   --  2.3  --   --   --     Liver Function Tests: No results for input(s): AST, ALT, ALKPHOS, BILITOT, PROT, ALBUMIN in the last 168 hours. No results for input(s): LIPASE, AMYLASE in the last 168 hours. No results for input(s): AMMONIA in the last 168 hours.  CBC:  Recent Labs Lab  08/18/15 1127 08/19/15 0813 08/20/15 0432 08/21/15 0621 08/22/15 0531  WBC 6.5 5.3 10.2 8.1 7.1  NEUTROABS 5.1  --   --   --   --   HGB 11.8* 11.2* 11.9* 12.1 11.3*  HCT 37.8 36.0 37.8 37.4 36.5  MCV 90.9 91.3 91.5 91.5 91.4  PLT 150 158 160 145* 152    Cardiac Enzymes:  Recent Labs Lab 08/18/15 1127 08/18/15 1608 08/18/15 1750 08/19/15 0001  TROPONINI 0.10* 4.44* 9.19* 18.09*    BNP: Invalid input(s): POCBNP  CBG:  Recent Labs Lab 08/21/15 0731 08/21/15 1733 08/21/15 2132 08/21/15 2221 08/22/15 0731  GLUCAP 161* 227* 217* 198* 210*    Microbiology: Results for orders placed or performed during the hospital encounter of 08/18/15  MRSA PCR Screening     Status: None   Collection Time: 08/18/15  9:36 PM  Result Value Ref Range Status   MRSA by PCR NEGATIVE NEGATIVE Final    Comment:        The GeneXpert MRSA Assay (FDA approved for NASAL specimens only), is one component of a comprehensive MRSA colonization surveillance program. It is not intended to diagnose MRSA infection nor to guide or monitor treatment for MRSA infections.     Coagulation Studies: No results  for input(s): LABPROT, INR in the last 72 hours.  Urinalysis:  Recent Labs  08/21/15 2120  COLORURINE YELLOW*  LABSPEC 1.010  PHURINE 5.0  GLUCOSEU NEGATIVE  HGBUR 1+*  BILIRUBINUR NEGATIVE  KETONESUR NEGATIVE  PROTEINUR NEGATIVE  NITRITE NEGATIVE  LEUKOCYTESUR NEGATIVE      Imaging: Dg Chest 1 View  08/22/2015  CLINICAL DATA:  CHF.  Cough. EXAM: CHEST 1 VIEW COMPARISON:  08/20/2015. FINDINGS: Left PICC line in stable position. Prior CABG. Cardiomegaly with pulmonary venous congestion bilateral pulmonary alveolar infiltrates again noted consistent congestive heart failure. Slight interim progression. Prominent left pleural effusion, progressed from prior exam. No pneumothorax. IMPRESSION: 1. Prior CABG. Progressive changes of congestive heart failure and pulmonary edema. 2.  Progressive left pleural effusion . 3. Left PICC line in stable position. Electronically Signed   By: Maisie Fus  Register   On: 08/22/2015 08:12   Dg Chest 1 View  08/20/2015  CLINICAL DATA:  Shortness of breath, history of CHF EXAM: CHEST 1 VIEW COMPARISON:  08/19/2015 FINDINGS: Cardiomegaly with mild to moderate interstitial edema. Superimposed patchy left upper lobe and right lower lobe opacities from possibly asymmetric edema, pneumonia not excluded. Suspected bilateral pleural effusions, left greater than right. Postsurgical changes related to prior CABG.  Median sternotomy. Degenerative changes of the bilateral shoulders. IMPRESSION: Cardiomegaly with mild to moderate interstitial edema. Superimposed pneumonia is not excluded. Suspected bilateral pleural effusions, left greater than right. Electronically Signed   By: Charline Bills M.D.   On: 08/20/2015 13:24   Dg Chest Port 1 View  08/20/2015  CLINICAL DATA:  Worsening shortness of breath. Wheezing. Congestive heart failure. Atrial fibrillation with rapid ventricular response. EXAM: PORTABLE CHEST 1 VIEW COMPARISON:  08/20/2015 FINDINGS: Left arm PICC line remains in appropriate position. Cardiomegaly stable. Bilateral mid and lower lung airspace disease shows no significant change. Left greater than right pleural effusions are again seen, as well as left retrocardiac consolidation or collapse. These findings are also stable. No pneumothorax visualized.  Previous CABG noted. IMPRESSION: No significant interval change since prior exam earlier today . Electronically Signed   By: Myles Rosenthal M.D.   On: 08/20/2015 17:19     Medications:   . heparin 750 Units/hr (08/22/15 0801)   . allopurinol  100 mg Oral Daily  . ALPRAZolam  0.25 mg Oral TID  . aspirin EC  81 mg Oral Daily  . atorvastatin  20 mg Oral QHS  . azelastine  2 spray Each Nare BID  . azithromycin  250 mg Oral Daily  . cefTRIAXone (ROCEPHIN)  IV  1 g Intravenous Q24H  . diltiazem   120 mg Oral Daily  . docusate sodium  100 mg Oral BID  . escitalopram  10 mg Oral Daily  . ferrous sulfate  325 mg Oral BID  . furosemide  40 mg Intravenous BID  . gabapentin  300 mg Oral TID  . insulin aspart  0-15 Units Subcutaneous TID WC  . insulin aspart  0-5 Units Subcutaneous QHS  . insulin detemir  42 Units Subcutaneous QHS  . isosorbide mononitrate  60 mg Oral Daily  . magnesium oxide  400 mg Oral Daily  . methylPREDNISolone (SOLU-MEDROL) injection  40 mg Intravenous Q12H  . metoprolol tartrate  25 mg Oral TID  . multivitamin with minerals  1 tablet Oral Daily  . pantoprazole  40 mg Oral BID  . polyethylene glycol  17 g Oral QHS  . senna-docusate  1 tablet Oral BID  . temazepam  15  mg Oral QHS  . tiotropium  18 mcg Inhalation Daily   ipratropium-albuterol, oxyCODONE-acetaminophen, sodium chloride  Assessment/ Plan:  [name] is a 81 y.o. white  female with diabetes mellitus type II Insulin Dependent, coronary artery disease status post CABG, congestive heart failure diastolic echo on 16/1, left AKA, peripheral vascular disease, right humeral fracture, was admitted on 08/18/2015   1. Acute Renal Failure on chronic kidney disease stage III with hyperkalemia and proteinuria: Acute renal failure due to acute cardiorenal syndrome. Seems that her creatinine fluctuates greatly with fluid status consistent with cardiorenal syndrome. Creatinine readings of 1.1-3.2. Most likely creatinine 2 is the baseline.  However now with metabolic alkalosis due to furosemide.  Hyperkalemia despite being off her potassium supplements. Due to renal failure. Avoid potassium supplements Proteinuria: no urinalysis on admission.  - Discussed case with Cardiology. Not a good metolazone candidate. Will consult pulmonary.   2. Hypertension, congestive heart failure - diastolic with acute exacerbation and atrial fibrillation with rapid ventricular response - IV furosemide  ordered,  intravascularly depleted now.   3. Diabetes mellitus type II with chronic kidney disease: insulin dependent. Hemoglobin A1c 6.6%.  - Continue glucose control.    LOS: 4 Keiondra [place]over 12/9/20169:39 AM

## 2015-08-22 NOTE — Progress Notes (Signed)
PT Cancellation Note  Patient Details Name: [name] MRN: [id number] DOB: 1925-03-30   Cancelled Treatment:    Reason Eval/Treat Not Completed:  (Pt's potassium noted to be 5.3.  Nursing reporting pt going for thoracentesis soon.  Will re-attempt PT eval tomorrow d/t pt going for procedure shortly.)   Hendricks Limesmily Gaetano Romberger 08/22/2015, 3:15 PM [name], PT [phone]

## 2015-08-22 NOTE — Care Management Important Message (Signed)
Important Message  Patient Details  Name: Evelena Asangrid H Felix MRN: [id number] Date of Birth: 12-20-1924   Medicare Important Message Given:  Yes    Olegario MessierKathy A Chanda Laperle 08/22/2015, 11:24 AM

## 2015-08-22 NOTE — Progress Notes (Signed)
[facility] [place] at [facility]   PATIENT NAME: [name]    MR#:  [id number]  DATE OF BIRTH:  [birth year]  SUBJECTIVE:   Patient is now back on 2 L of oxygen. Her creatinine has increased. Chest x-ray today shows continued pulmonary edema. Family is requesting consultation with pulmonary.Marland Kitchen  REVIEW OF SYSTEMS:    Review of Systems  Constitutional: Negative for fever, chills and malaise/fatigue.  HENT: Negative for sore throat.   Eyes: Negative for blurred vision.  Respiratory: Positive for cough. Negative for hemoptysis and sputum production.   Cardiovascular: Negative for chest pain, palpitations and leg swelling.  Gastrointestinal: Negative for nausea, vomiting, abdominal pain, diarrhea and blood in stool.  Genitourinary: Negative for dysuria.  Musculoskeletal: Negative for back pain.  Neurological: Positive for weakness. Negative for dizziness, tremors and headaches.  Endo/Heme/Allergies: Does not bruise/bleed easily.    Tolerating Diet:yes      DRUG ALLERGIES:   Allergies  Allergen Reactions  . Dilaudid [Hydromorphone Hcl] Nausea And Vomiting  . Morphine And Related Other (See Comments)    Reaction:  Lightheadedness  Patient's daughter says she can take morphine.    VITALS:  Blood pressure 111/55, pulse 65, temperature 97.6 F (36.4 C), temperature source Oral, resp. rate 22, height  (1.499 m), weight 95.255 kg (210 lb), SpO2 98 %.  PHYSICAL EXAMINATION:   Physical Exam  Constitutional: She is oriented to person, place, and time and well-developed, well-nourished, and in no distress. No distress.  HENT:  Head: Normocephalic.  Eyes: No scleral icterus.  Neck: Normal range of motion. Neck supple. No JVD present. No tracheal deviation present.  Cardiovascular: Normal rate and regular rhythm.  Exam reveals no gallop and no friction rub.   Murmur heard. Pulmonary/Chest: Effort normal. No respiratory distress. She has wheezes.  She has no rales. She exhibits no tenderness.  Tight   Abdominal: Soft. Bowel sounds are normal. She exhibits no distension and no mass. There is no tenderness. There is no rebound and no guarding.  Musculoskeletal: Normal range of motion. She exhibits no edema.  Right AKA  Neurological: She is alert and oriented to person, place, and time.  Skin: Skin is warm. No rash noted. No erythema.  Psychiatric: Affect and judgment normal.      LABORATORY PANEL:   CBC  Recent Labs Lab 08/22/15 0531  WBC 7.1  HGB 11.3*  HCT 36.5  PLT 152   ------------------------------------------------------------------------------------------------------------------  Chemistries   Recent Labs Lab 08/20/15 0432  08/22/15 0531  NA 136  < > 138  K 5.5*  < > 5.3*  CL 92*  < > 91*  CO2 35*  < > 39*  GLUCOSE 194*  < > 169*  BUN 47*  < > 74*  CREATININE 1.95*  < > 2.24*  CALCIUM 8.8*  < > 8.3*  MG 2.3  --   --   < > = values in this interval not displayed. ------------------------------------------------------------------------------------------------------------------  Cardiac Enzymes  Recent Labs Lab 08/18/15 1750 08/19/15 0001 08/22/15 0531  TROPONINI 9.19* 18.09* 4.37*   ------------------------------------------------------------------------------------------------------------------  RADIOLOGY:  Dg Chest 1 View  08/22/2015  CLINICAL DATA:  CHF.  Cough. EXAM: CHEST 1 VIEW COMPARISON:  08/20/2015. FINDINGS: Left PICC line in stable position. Prior CABG. Cardiomegaly with pulmonary venous congestion bilateral pulmonary alveolar infiltrates again noted consistent congestive heart failure. Slight interim progression. Prominent left pleural effusion, progressed from prior exam. No pneumothorax. IMPRESSION: 1. Prior CABG. Progressive changes of congestive heart  failure and pulmonary edema. 2. Progressive left pleural effusion . 3. Left PICC line in stable position. Electronically Signed   By:  Maisie Fus  Register   On: 08/22/2015 08:12   Dg Chest 1 View  08/20/2015  CLINICAL DATA:  Shortness of breath, history of CHF EXAM: CHEST 1 VIEW COMPARISON:  08/19/2015 FINDINGS: Cardiomegaly with mild to moderate interstitial edema. Superimposed patchy left upper lobe and right lower lobe opacities from possibly asymmetric edema, pneumonia not excluded. Suspected bilateral pleural effusions, left greater than right. Postsurgical changes related to prior CABG.  Median sternotomy. Degenerative changes of the bilateral shoulders. IMPRESSION: Cardiomegaly with mild to moderate interstitial edema. Superimposed pneumonia is not excluded. Suspected bilateral pleural effusions, left greater than right. Electronically Signed   By: Charline Bills M.D.   On: 08/20/2015 13:24   Dg Chest Port 1 View  08/20/2015  CLINICAL DATA:  Worsening shortness of breath. Wheezing. Congestive heart failure. Atrial fibrillation with rapid ventricular response. EXAM: PORTABLE CHEST 1 VIEW COMPARISON:  08/20/2015 FINDINGS: Left arm PICC line remains in appropriate position. Cardiomegaly stable. Bilateral mid and lower lung airspace disease shows no significant change. Left greater than right pleural effusions are again seen, as well as left retrocardiac consolidation or collapse. These findings are also stable. No pneumothorax visualized.  Previous CABG noted. IMPRESSION: No significant interval change since prior exam earlier today . Electronically Signed   By: Myles Rosenthal M.D.   On: 08/20/2015 17:19     ASSESSMENT AND PLAN:    81 year old female with a history of coronary artery disease and diastolic heart failure who presented with shortness of breath and atrial fibrillation with RVR.  1. Atrial fibrillation with RVR: Patient's heart rates have now improved. Continue metoprolol and diltiazem. Patient is now in normal sinus rhythm. As per my conversation with cardiology heparin will be discontinued. Patient may need long-term  anticoagulation. I will defer this to cardiology. .  2. Non-ST elevation: Appreciate cardiology consultation. Due to patient's respiratory status she is unable to undergo cardiac catheterization. Stop heparin drip today. Continue Imdur, metoprolol, aspirin and atorvastatin.  3. Acute on chronic diastolic heart failure: Patient's ejection fraction was 50-55%. She has moderate TR on echocardiogram.  Continue Lasix IV. I suspect she may have had a component of tachycardia induced pulmonary edema.  4. Acute on chronic hypoxic respiratory failure: This is likely secondary to the congestive heart failure along with atrial fibrillation and COPD exacerbation. Patient's oxygen is now back on her baseline 2 L. Continue treatment for COPD and CHF as well as atrial fibrillation.  6. COPD exacerbation: Wean IV steroids as tolerated and continue nebulizers and inhalers. continue Rocephin and azithromycin through Sunday. Bone and consultation placed.   7. Diabetes: Continue Levemir and sliding scale insulin. A1c 6.6   8. Acute on chronic kidney disease stage III with hyperkalemia and proteinuria: Acute renal failure secondary to acute cardiorenal syndrome. As per nephrology consultation creatinine fluctuates between 1.1-3.2. Most likely baseline creatinine is 2. Hold nephrotoxic agents. Continue to monitor creatinine.  9. Hyperkalemia: Avoid potassium supplements. Potassium is elevated in the setting of renal failure.   Management plans discussed with the patient and family and she is in agreement.  CODE STATUS: FULL  TOTAL TIME TAKING CARE OF THIS PATIENT: 25 minutes.   Greater than 50% time spent in caring coronation. Discussed with Dr. Juliann Pares and family  POSSIBLE D/C 3-4 days, DEPENDING ON CLINICAL CONDITION.to SNF   Zaion Hreha M.D on 08/22/2015 at 11:00 AM  Between 7am to 6pm - Pager - [phone] After 6pm go to [email or web address] - password [email or web address]  [facility]  Office   [phone]  CC: Primary care physician; Danella PentonMILLER,MARK F., MD  Note: This dictation was prepared with Dragon dictation along with smaller phrase technology. Any transcriptional errors that result from this process are unintentional.

## 2015-08-22 NOTE — Plan of Care (Signed)
Problem: Tissue Perfusion: Goal: Risk factors for ineffective tissue perfusion will decrease Outcome: Progressing Heparin gtt  Problem: Activity: Goal: Risk for activity intolerance will decrease Outcome: Not Progressing PT eval ordered

## 2015-08-22 NOTE — Progress Notes (Signed)
Pharmacy Consult for Heparin  Indication: atrial fibrillation  Allergies  Allergen Reactions  . Dilaudid [Hydromorphone Hcl] Nausea And Vomiting  . Morphine And Related Other (See Comments)    Reaction:  Lightheadedness  Patient's daughter says she can take morphine.    Patient Measurements: Height: 4\' 11"  (149.9 cm) Weight: 210 lb (95.255 kg) IBW/kg (Calculated) : 43.2 Heparin Dosing Weight: 65.7 kg   Vital Signs: Temp: 97.5 F (36.4 C) (12/09 0522) Temp Source: Oral (12/09 0522) BP: 100/48 mmHg (12/09 0522) Pulse Rate: 63 (12/09 0522)  Labs:  Recent Labs  08/20/15 0432 08/20/15 1254 08/21/15 0621 08/21/15 0716 08/22/15 0531  HGB 11.9*  --  12.1  --  11.3*  HCT 37.8  --  37.4  --  36.5  PLT 160  --  145*  --  152  HEPARINUNFRC 0.61 0.65  --  0.66 1.07*  CREATININE 1.95*  --  2.03*  --  2.24*    Estimated Creatinine Clearance: 17.5 mL/min (by C-G formula based on Cr of 2.24).   Assessment: Pharmacy consulted to dose heparin in this 81 year old female admitted with Afib.  No prior anticoag noted.   HL= 0.68  Goal of Therapy:  Heparin level 0.3-0.7 units/ml Monitor platelets by anticoagulation protocol: Yes   Plan:  Heparin level supratherapeutic. Asked nurse to hold x 1 hour, and decreased rate (resuming at 0800) to 750 units/hr. Will recheck level 8 hours after resuming drip.  Erasmus Bistline A. Ballplayookson, Vermont[place]arm.D., BCPS Clinical Pharmacist  08/22/2015 6:46 AM

## 2015-08-22 NOTE — Progress Notes (Signed)
Back from Ultrasound, Lt thoracentesis completed.  800 ml clear pink tinged fluid removed, specimens sent to lab.  Bandaid noted to lt upper back, small amount light pink drainage noted to bandaid.  No distress on 2LO2 per [place].  Denies need, family at bedside.

## 2015-08-22 NOTE — Consult Note (Signed)
Pulmonary Critical Care  Initial Consult Note   [name] ZOX:[id number] DOB: 1925-09-07 DOA: 08/18/2015  Referring physician: Adrian Saran, MD PCP: Danella Penton., MD   Chief Complaint: Respiratory failure  HPI: [name] is a 81 y.o. female with prior history of CHF CAD HTN DM II presented to the hospital with increased shortness of breath. Patient apparently lives in a skilled facility rehab center. She had been having increased dyspnea for about a week. Patient was admitted as there was some concern for pneumonia she was started on antibiotics in the facility. She had also been having increased fluid retention and weight gain. Patient on admission had a CXR showing increased infiltrates and also an increased left sided pleural effusion. Patient was also noted on admission to have A fib with RVR. Patient was seen by cardiology and was suggested to continue with abx steroids and nebs. Currently she is back down on 2lpm flow. She would likely benefit from a drainage of the fluid   Review of Systems:   Remainder ROS performed and is unremarkable other than noted in HPI  Past Medical History  Diagnosis Date  . Cancer (HCC)   . Blood transfusion without reported diagnosis   . CHF (congestive heart failure) (HCC)   . Coronary artery disease   . Hypertension   . Shortness of breath dyspnea   . Diabetes mellitus without complication (HCC)   . Peripheral vascular disease [facility])    Past Surgical History  Procedure Laterality Date  . Back surgery    . Coronary artery bypass graft    . Vascular surgery    . Peripheral vascular catheterization Right 05/27/2015    Procedure: Lower Extremity Angiography;  Surgeon: Renford Dills, MD;  Location: [facility] INVASIVE CV LAB;  Service: Cardiovascular;  Laterality: Right;  . Peripheral vascular catheterization  05/27/2015    Procedure: Lower Extremity Intervention;  Surgeon: Renford Dills, MD;  Location: [facility] INVASIVE CV LAB;  Service:  Cardiovascular;;  . Amputation Right 06/06/2015    Procedure: AMPUTATION ABOVE KNEE;  Surgeon: Annice Needy, MD;  Location: [facility] ORS;  Service: Vascular;  Laterality: Right;  . Above the knee amputation      right leg   Social History:  reports that she has never smoked. She does not have any smokeless tobacco history on file. She reports that she does not drink alcohol or use illicit drugs.  Allergies  Allergen Reactions  . Dilaudid [Hydromorphone Hcl] Nausea And Vomiting  . Morphine And Related Other (See Comments)    Reaction:  Lightheadedness  Patient's daughter says she can take morphine.    Family History  Problem Relation Age of Onset  . Diabetes Mother   . Diabetes Father     Prior to Admission medications   Medication Sig Start Date End Date Taking? Authorizing Provider  acetaminophen (TYLENOL) 325 MG tablet Take 2 tablets (650 mg total) by mouth every 6 (six) hours as needed for mild pain (or Fever >/= 101). 05/31/15  Yes Kimberly A Stegmayer, PA-C  allopurinol (ZYLOPRIM) 100 MG tablet Take 100 mg by mouth daily.    Yes Historical Provider, MD  ALPRAZolam (XANAX) 0.25 MG tablet Take 1 tablet (0.25 mg total) by mouth 3 (three) times daily. 06/10/15  Yes Danella Penton, MD  aspirin EC 81 MG tablet Take 81 mg by mouth daily.   Yes Historical Provider, MD  atorvastatin (LIPITOR) 20 MG tablet Take 20 mg by mouth at bedtime.   Yes  Historical Provider, MD  azelastine (ASTELIN) 0.1 % nasal spray Place 2 sprays into both nostrils 2 (two) times daily. Use in each nostril as directed   Yes Historical Provider, MD  diltiazem (CARDIZEM) 30 MG tablet Take 1 tablet (30 mg total) by mouth every 8 (eight) hours. 06/11/15  Yes Danella Penton, MD  escitalopram (LEXAPRO) 5 MG tablet Take 1 tablet (5 mg total) by mouth daily. Patient taking differently: Take 10 mg by mouth daily.  05/31/15  Yes Kimberly A Stegmayer, PA-C  ferrous sulfate 325 (65 FE) MG tablet Take 325 mg by mouth 2 (two) times daily.    Yes Historical Provider, MD  furosemide (LASIX) 20 MG tablet Take 20 mg by mouth 2 (two) times daily.   Yes Historical Provider, MD  gabapentin (NEURONTIN) 300 MG capsule Take 300 mg by mouth 3 (three) times daily.   Yes Historical Provider, MD  insulin aspart (NOVOLOG) 100 UNIT/ML injection Inject 0-15 Units into the skin 3 (three) times daily with meals. 06/11/15  Yes Danella Penton, MD  insulin detemir (LEVEMIR) 100 UNIT/ML injection Inject 0.42 mLs (42 Units total) into the skin at bedtime. Patient taking differently: Inject 57 Units into the skin daily.  06/10/15  Yes Danella Penton, MD  ipratropium-albuterol (DUONEB) 0.5-2.5 (3) MG/3ML SOLN Take 3 mLs by nebulization every 6 (six) hours as needed (for shortness of breath).   Yes Historical Provider, MD  isosorbide mononitrate (IMDUR) 60 MG 24 hr tablet Take 60 mg by mouth daily.   Yes Historical Provider, MD  magnesium oxide (MAG-OX) 400 MG tablet Take 400 mg by mouth daily.   Yes Historical Provider, MD  metoprolol tartrate (LOPRESSOR) 25 MG tablet Take 1 tablet (25 mg total) by mouth 3 (three) times daily. 06/10/15  Yes Danella Penton, MD  Multiple Vitamin (MULTIVITAMIN WITH MINERALS) TABS tablet Take 1 tablet by mouth daily.   Yes Historical Provider, MD  oxyCODONE-acetaminophen (PERCOCET/ROXICET) 5-325 MG per tablet One to two tabs every four hours as needed for pain Patient taking differently: Take 1-2 tablets by mouth every 4 (four) hours as needed for severe pain.  05/31/15  Yes Kimberly A Stegmayer, PA-C  pantoprazole (PROTONIX) 40 MG tablet Take 1 tablet (40 mg total) by mouth 2 (two) times daily. 06/10/15  Yes Danella Penton, MD  polyethylene glycol [facility] / GLYCOLAX) packet Take 17 g by mouth at bedtime.    Yes Historical Provider, MD  senna-docusate (SENOKOT-S) 8.6-50 MG tablet Take 1 tablet by mouth 2 (two) times daily.   Yes Historical Provider, MD  spironolactone (ALDACTONE) 25 MG tablet Take 1 tablet (25 mg total) by mouth daily.  06/11/15  Yes Danella Penton, MD  temazepam (RESTORIL) 15 MG capsule Take 1 capsule (15 mg total) by mouth at bedtime. 06/10/15  Yes Danella Penton, MD  docusate sodium (COLACE) 100 MG capsule Take 1 capsule (100 mg total) by mouth 2 (two) times daily. Patient not taking: Reported on 08/18/2015 05/31/15   Ranae Plumber Stegmayer, PA-C  torsemide (DEMADEX) 10 MG tablet Take 1 tablet (10 mg total) by mouth daily. Patient not taking: Reported on 08/18/2015 06/10/15   Danella Penton, MD   Physical Exam: Filed Vitals:   08/22/15 0522 08/22/15 0802 08/22/15 1126 08/22/15 1127  BP: 100/48 111/55 94/30 99/40   Pulse: 63 65 74 74  Temp: 97.5 F (36.4 C) 97.6 F (36.4 C) 97.8 F (36.6 C)   TempSrc: Oral Oral Oral   Resp:  18   Height:      Weight: 95.255 kg (210 lb)     SpO2: 96% 98% 96%     Wt Readings from Last 3 Encounters:  08/22/15 95.255 kg (210 lb)  06/05/15 95.709 kg (211 lb)  05/30/15 96.435 kg (212 lb 9.6 oz)    General:  Appears calm and comfortable Eyes: PERRL, normal lids, irises & conjunctiva ENT: grossly hard of hearing, lips & tongue Neck: no LAD, masses or thyromegaly Cardiovascular: RRR, no m/r/g. ++LE edema. Respiratory: diminished on the left side. Normal respiratory effort. Abdomen: soft, nontender distended Skin: no rash or induration seen on limited exam Musculoskeletal: grossly normal tone BUE/BLE Psychiatric: grossly flat affect Neurologic: grossly non-focal.          Labs on Admission:  Basic Metabolic Panel:  Recent Labs Lab 08/18/15 1127 08/19/15 0813 08/20/15 0432 08/20/15 1254 08/21/15 0621 08/22/15 0531  NA 143 137 136  --  136 138  K 4.0 5.0 5.5* 5.6* 5.3* 5.3*  CL 97* 94* 92*  --  92* 91*  CO2 38* 34* 35*  --  37* 39*  GLUCOSE 270* 331* 194*  --  174* 169*  BUN 36* 40* 47*  --  62* 74*  CREATININE 1.23* 1.69* 1.95*  --  2.03* 2.24*  CALCIUM 9.1 8.6* 8.8*  --  8.4* 8.3*  MG  --   --  2.3  --   --   --    Liver Function Tests: No results for  input(s): AST, ALT, ALKPHOS, BILITOT, PROT, ALBUMIN in the last 168 hours. No results for input(s): LIPASE, AMYLASE in the last 168 hours. No results for input(s): AMMONIA in the last 168 hours. CBC:  Recent Labs Lab 08/18/15 1127 08/19/15 0813 08/20/15 0432 08/21/15 0621 08/22/15 0531  WBC 6.5 5.3 10.2 8.1 7.1  NEUTROABS 5.1  --   --   --   --   HGB 11.8* 11.2* 11.9* 12.1 11.3*  HCT 37.8 36.0 37.8 37.4 36.5  MCV 90.9 91.3 91.5 91.5 91.4  PLT 150 158 160 145* 152   Cardiac Enzymes:  Recent Labs Lab 08/18/15 1127 08/18/15 1608 08/18/15 1750 08/19/15 0001 08/22/15 0531  TROPONINI 0.10* 4.44* 9.19* 18.09* 4.37*    BNP (last 3 results)  Recent Labs  08/18/15 1127  BNP 1720.0*    ProBNP (last 3 results) No results for input(s): PROBNP in the last 8760 hours.  CBG:  Recent Labs Lab 08/21/15 1733 08/21/15 2132 08/21/15 2221 08/22/15 0731 08/22/15 1123  GLUCAP 227* 217* 198* 210* 160*    Radiological Exams on Admission: Dg Chest 1 View  08/22/2015  CLINICAL DATA:  CHF.  Cough. EXAM: CHEST 1 VIEW COMPARISON:  08/20/2015. FINDINGS: Left PICC line in stable position. Prior CABG. Cardiomegaly with pulmonary venous congestion bilateral pulmonary alveolar infiltrates again noted consistent congestive heart failure. Slight interim progression. Prominent left pleural effusion, progressed from prior exam. No pneumothorax. IMPRESSION: 1. Prior CABG. Progressive changes of congestive heart failure and pulmonary edema. 2. Progressive left pleural effusion . 3. Left PICC line in stable position. Electronically Signed   By: Maisie Fushomas  Register   On: 08/22/2015 08:12   Dg Chest 1 View  08/20/2015  CLINICAL DATA:  Shortness of breath, history of CHF EXAM: CHEST 1 VIEW COMPARISON:  08/19/2015 FINDINGS: Cardiomegaly with mild to moderate interstitial edema. Superimposed patchy left upper lobe and right lower lobe opacities from possibly asymmetric edema, pneumonia not excluded.  Suspected bilateral pleural effusions, left greater than right.  Postsurgical changes related to prior CABG.  Median sternotomy. Degenerative changes of the bilateral shoulders. IMPRESSION: Cardiomegaly with mild to moderate interstitial edema. Superimposed pneumonia is not excluded. Suspected bilateral pleural effusions, left greater than right. Electronically Signed   By: Charline Bills M.D.   On: 08/20/2015 13:24   Dg Chest Port 1 View  08/20/2015  CLINICAL DATA:  Worsening shortness of breath. Wheezing. Congestive heart failure. Atrial fibrillation with rapid ventricular response. EXAM: PORTABLE CHEST 1 VIEW COMPARISON:  08/20/2015 FINDINGS: Left arm PICC line remains in appropriate position. Cardiomegaly stable. Bilateral mid and lower lung airspace disease shows no significant change. Left greater than right pleural effusions are again seen, as well as left retrocardiac consolidation or collapse. These findings are also stable. No pneumothorax visualized.  Previous CABG noted. IMPRESSION: No significant interval change since prior exam earlier today . Electronically Signed   By: Myles Rosenthal M.D.   On: 08/20/2015 17:19      EKG: Independently reviewed.  Assessment/Plan Principal Problem:   Atrial fibrillation with RVR (HCC) Active Problems:   CHF (congestive heart failure) (HCC)   1. Acute on Chronic respiratory failure -will get an ABG now -patient is a DNR at this time -continue with oxygen therapy -probable element of CHF so would continue to treat with diuretics -it may also be beneficial to tap the pleural effusion which has increased in size since the last film was done  2. Probable COPD -would continue with inhalers as your are -steroids as ordered -again likely that the pleural effusion is playing a role in the dyspnea  3. Pleural Effusion -will get thoracentesis -send fluid for routine labs  4. Atrial Fib -per cardiology  5. ?pneumonia -on antibiotics will  continue    Code Status: DNR  Disposition Plan: SNF      I have personally obtained a history, examined the patient, evaluated laboratory and imaging results, formulated the assessment and plan and placed orders.  The Patient requires high complexity decision making for assessment and support.    Yevonne Pax, MD [facility] Pulmonary Critical Care Medicine Sleep Medicine

## 2015-08-22 NOTE — Progress Notes (Signed)
Initial Nutrition Assessment     INTERVENTION:  Meals and snacks: cater to pt preferences.  Pt may benefit from K restriction    NUTRITION DIAGNOSIS:   Altered nutrition lab value related to chronic illness as evidenced by  (elevated K).    GOAL:   Patient will meet greater than or equal to 90% of their needs    MONITOR:    (Energy intake, Electrolyte and renal profile, glucose profile)  REASON FOR ASSESSMENT:   LOS    ASSESSMENT:      Pt admitted with shortness of breath and left sided pleural effusion. Planning thoracentesis this pm  Past Medical History  Diagnosis Date  . Cancer (HCC)   . Blood transfusion without reported diagnosis   . CHF (congestive heart failure) (HCC)   . Coronary artery disease   . Hypertension   . Shortness of breath dyspnea   . Diabetes mellitus without complication (HCC)   . Peripheral vascular disease (HCC)     Current Nutrition: eating 75-100% of meals.  Pt reports eating mostly soup, salad and fruit for meals  Food/Nutrition-Related History: normal intake prior to admission   Scheduled Medications:  . allopurinol  100 mg Oral Daily  . ALPRAZolam  0.25 mg Oral TID  . aspirin EC  81 mg Oral Daily  . atorvastatin  20 mg Oral QHS  . azelastine  2 spray Each Nare BID  . azithromycin  250 mg Oral Daily  . cefTRIAXone (ROCEPHIN)  IV  1 g Intravenous Q24H  . diltiazem  120 mg Oral Daily  . docusate sodium  100 mg Oral BID  . escitalopram  10 mg Oral Daily  . ferrous sulfate  325 mg Oral BID  . furosemide  40 mg Intravenous BID  . gabapentin  300 mg Oral BID  . insulin aspart  0-15 Units Subcutaneous TID WC  . insulin aspart  0-5 Units Subcutaneous QHS  . insulin detemir  42 Units Subcutaneous QHS  . isosorbide mononitrate  60 mg Oral Daily  . magnesium oxide  400 mg Oral Daily  . methylPREDNISolone (SOLU-MEDROL) injection  40 mg Intravenous Q12H  . metoprolol tartrate  25 mg Oral TID  . multivitamin with minerals  1  tablet Oral Daily  . pantoprazole  40 mg Oral BID  . polyethylene glycol  17 g Oral QHS  . senna-docusate  1 tablet Oral BID  . temazepam  15 mg Oral QHS  . tiotropium  18 mcg Inhalation Daily    Continuous Medications:      Electrolyte/Renal Profile and Glucose Profile:   Recent Labs Lab 08/20/15 0432 08/20/15 1254 08/21/15 0621 08/22/15 0531  NA 136  --  136 138  K 5.5* 5.6* 5.3* 5.3*  CL 92*  --  92* 91*  CO2 35*  --  37* 39*  BUN 47*  --  62* 74*  CREATININE 1.95*  --  2.03* 2.24*  CALCIUM 8.8*  --  8.4* 8.3*  MG 2.3  --   --   --   GLUCOSE 194*  --  174* 169*    Gastrointestinal Profile: Last BM:12/8     Weight Change: wt gain from fluid    Diet Order:  Diet heart healthy/carb modified Room service appropriate?: Yes; Fluid consistency:: Thin  Skin:   reviewed   Height:   Ht Readings from Last 1 Encounters:  08/18/15 4\' 11"  (1.499 m)    Weight:   Wt Readings from Last 1 Encounters:  08/22/15  210 lb (95.255 kg)    Ideal Body Weight:     BMI:  Body mass index is 42.39 kg/(m^2).   EDUCATION NEEDS:   No education needs identified at this time  LOW Care Level  Chanin Frumkin B. Freida Busman, RD, LDN [phone] (pager)

## 2015-08-22 NOTE — Progress Notes (Signed)
Subjective:   patient is slightly better but still significantly short of breath this make with congestion mild wheezing denies any pain.  not able to clear sputum  Complained of generalized weakness fatigue  Objective:  Vital Signs in the last 24 hours: Temp:  [97.5 F (36.4 C)-97.8 F (36.6 C)] 97.8 F (36.6 C) (12/09 1126) Pulse Rate:  [62-74] 74 (12/09 1127) Resp:  [18-22] 18 (12/09 1126) BP: (94-115)/(30-72) 99/40 mmHg (12/09 1127) SpO2:  [96 %-99 %] 96 % (12/09 1126) Weight:  [95.255 kg (210 lb)] 95.255 kg (210 lb) (12/09 0522)  Intake/Output from previous day: 12/08 0701 - 12/09 0700 In: 467.4 [P.O.:240; I.V.:227.4] Out: 600 [Urine:600] Intake/Output from this shift: Total I/O In: 152.9 [P.O.:120; I.V.:32.9] Out: 0   Physical Exam: General appearance: alert, cooperative and appears stated age Neck: no adenopathy, no carotid bruit, no JVD, supple, symmetrical, trachea midline and thyroid not enlarged, symmetric, no tenderness/mass/nodules Lungs: diminished breath sounds bilaterally, dullness to percussion bibasilar and bilaterally, rales bibasilar and bilaterally, rhonchi bibasilar and bilaterally and wheezes bibasilar and bilaterally Heart: irregularly irregular rhythm, no S3 or S4 and systolic murmur: systolic ejection 3/6, crescendo at 2nd left intercostal space Abdomen: soft, non-tender; bowel sounds normal; no masses,  no organomegaly Extremities: Right-sided AKA left-sided mild edema reduced pulses Pulses: 2+ and symmetric Reduced pulses throughout and lower extremities Skin: Skin color, texture, turgor normal. No rashes or lesions Neurologic: Alert and oriented X 3, normal strength and tone. Normal symmetric reflexes. Normal coordination and gait  Lab Results:  Recent Labs  08/21/15 0621 08/22/15 0531  WBC 8.1 7.1  HGB 12.1 11.3*  PLT 145* 152    Recent Labs  08/21/15 0621 08/22/15 0531  NA 136 138  K 5.3* 5.3*  CL 92* 91*  CO2 37* 39*  GLUCOSE 174*  169*  BUN 62* 74*  CREATININE 2.03* 2.24*    Recent Labs  08/22/15 0531  TROPONINI 4.37*   Hepatic Function Panel No results for input(s): PROT, ALBUMIN, AST, ALT, ALKPHOS, BILITOT, BILIDIR, IBILI in the last 72 hours. No results for input(s): CHOL in the last 72 hours. No results for input(s): PROTIME in the last 72 hours.  Imaging: Imaging results have been reviewed  Cardiac Studies:  Assessment/Plan:  Arrhythmia Atrial Fibrillation CHF Edema Palpitations Shortness of Breath Valvular Disease Vascular Disease   moderate to severe aortic stenosis  peripheral vascular disease  AKA on the right  diabetes  hyperlipidemia  COPD . PLAN  shortness of breath persistent recurrent recommend inhalers recommend pulmonary input  COPD recommend inhalers recommend steroid therapy  congestive heart failure recommend diuretic therapy  atrial fibrillation rate controlled consider long-term anticoagulation  hypertension reasonably control continue current therapy  acute on chronic renal insufficiency agree with Nephrology input  diabetes continue current therapy including insulin  coronary artery disease persistent  DVT prophylaxis  continue supplemental oxygen hypoxemia shortness of breath     LOS: 4 days    Edyth Glomb D. 08/22/2015, 12:38 PM

## 2015-08-22 NOTE — Procedures (Signed)
US guided left thoracentesis yielded 800 ml of pink colored fluid.  Minimal blood loss and no immediate complication.  CXR pending.

## 2015-08-22 NOTE — Progress Notes (Signed)
To Ultrasound via bed 

## 2015-08-23 ENCOUNTER — Inpatient Hospital Stay: Payer: Medicare Other

## 2015-08-23 ENCOUNTER — Encounter: Payer: Self-pay | Admitting: Internal Medicine

## 2015-08-23 LAB — BASIC METABOLIC PANEL
Anion gap: 5 (ref 5–15)
BUN: 78 mg/dL — AB (ref 6–20)
CHLORIDE: 93 mmol/L — AB (ref 101–111)
CO2: 41 mmol/L — AB (ref 22–32)
CREATININE: 1.84 mg/dL — AB (ref 0.44–1.00)
Calcium: 8.2 mg/dL — ABNORMAL LOW (ref 8.9–10.3)
GFR calc Af Amer: 27 mL/min — ABNORMAL LOW (ref >60)
GFR calc non Af Amer: 23 mL/min — ABNORMAL LOW (ref >60)
Glucose, Bld: 177 mg/dL — ABNORMAL HIGH (ref 65–99)
Potassium: 4.7 mmol/L (ref 3.5–5.1)
SODIUM: 139 mmol/L (ref 135–145)

## 2015-08-23 LAB — GLUCOSE, CAPILLARY
GLUCOSE-CAPILLARY: 186 mg/dL — AB (ref 65–99)
Glucose-Capillary: 155 mg/dL — ABNORMAL HIGH (ref 65–99)

## 2015-08-23 MED ORDER — PREDNISONE 50 MG PO TABS
60.0000 mg | ORAL_TABLET | Freq: Every day | ORAL | Status: DC
  Administered 2015-08-24: 60 mg via ORAL
  Filled 2015-08-23: qty 1

## 2015-08-23 MED ORDER — GUAIFENESIN-DM 100-10 MG/5ML PO SYRP
10.0000 mL | ORAL_SOLUTION | Freq: Four times a day (QID) | ORAL | Status: DC | PRN
  Administered 2015-08-24: 10 mL via ORAL
  Filled 2015-08-23: qty 10

## 2015-08-23 MED ORDER — APIXABAN 2.5 MG PO TABS
2.5000 mg | ORAL_TABLET | Freq: Two times a day (BID) | ORAL | Status: DC
  Administered 2015-08-23 – 2015-08-24 (×3): 2.5 mg via ORAL
  Filled 2015-08-23 (×3): qty 1

## 2015-08-23 NOTE — Progress Notes (Signed)
Spoke with Daughter to about need for Anticoagulation due to A Fib. REVIEWED SE,a,r, and benefits with daughter. She accepts risks not limited to bleeding.  PHARMACY to dose ELIQUIS.

## 2015-08-23 NOTE — Consult Note (Signed)
ANTICOAGULATION CONSULT NOTE - Initial Consult  Pharmacy Consult for Eliquis (Apixaban) Indication: atrial fibrillation  Patient Measurements: Height: 4\' 11"  (149.9 cm) Weight: 208 lb (94.348 kg) IBW/kg (Calculated) : 43.2  Recent Labs  08/20/15 1254 08/21/15 0621 08/21/15 0716 08/22/15 0531 08/22/15 1208 08/22/15 1733 08/23/15 0622  HGB  --  12.1  --  11.3*  --   --   --   HCT  --  37.4  --  36.5  --   --   --   PLT  --  145*  --  152  --   --   --   HEPARINUNFRC 0.65  --  0.66 1.07*  --   --   --   CREATININE  --  2.03*  --  2.24*  --   --  1.84*  TROPONINI  --   --   --  4.37* 2.77* 3.61*  --     Estimated Creatinine Clearance: 21.2 mL/min (by C-G formula based on Cr of 1.84).   Medical History: Past Medical History  Diagnosis Date  . Cancer (HCC)   . Blood transfusion without reported diagnosis   . CHF (congestive heart failure) (HCC)   . Coronary artery disease   . Hypertension   . Shortness of breath dyspnea   . Diabetes mellitus without complication (HCC)   . Peripheral vascular disease (HCC)   . Atrial fibrillation [facility](HCC)     Assessment: 81 yo old female needing anticoagulation due to Atrial fibrillation with RVR. Pharmacy consulted for dosing of apixaban.   12/10:  Scr: 1.84   Wt: 94.3 kg  Age:81   Plan:  Due to patients age [age] and Scr >1.5, will start patient on Apixaban 2.5mg  BID.  Patient and family have been counseled on risk/benefit and SE by Dr. Juliene PinaMody.   Cher NakaiSheema Davia Smyre, PharmD Pharmacy Resident  08/23/2015,10:06 AM

## 2015-08-23 NOTE — Progress Notes (Signed)
[facility] - [place] [facility]   PATIENT NAME: [name]    MR#:  [id number]  DATE OF BIRTH:  [birth year]  SUBJECTIVE:   Patient underwent thoracentesis yesterday and subjectively feels much better today as far shortness of breath. She continues to have a cough. REVIEW OF SYSTEMS:    Review of Systems  Constitutional: Negative for fever, chills and malaise/fatigue.  HENT: Negative for sore throat.   Eyes: Negative for blurred vision.  Respiratory: Positive for cough. Negative for hemoptysis and sputum production.   Cardiovascular: Negative for chest pain, palpitations and leg swelling.  Gastrointestinal: Negative for nausea, vomiting, abdominal pain, diarrhea and blood in stool.  Genitourinary: Negative for dysuria.  Musculoskeletal: Negative for back pain.  Neurological: Positive for weakness. Negative for dizziness, tremors and headaches.  Endo/Heme/Allergies: Does not bruise/bleed easily.    Tolerating Diet:yes      DRUG ALLERGIES:   Allergies  Allergen Reactions  . Dilaudid [Hydromorphone Hcl] Nausea And Vomiting  . Morphine And Related Other (See Comments)    Reaction:  Lightheadedness  Patient's daughter says she can take morphine.    VITALS:  Blood pressure 120/68, pulse 68, temperature 97.5 F (36.4 C), temperature source Axillary, resp. rate 20, height  (1.499 m), weight 94.348 kg (208 lb), SpO2 96 %.  PHYSICAL EXAMINATION:   Physical Exam  Constitutional: She is oriented to person, place, and time and well-developed, well-nourished, and in no distress. No distress.  HENT:  Head: Normocephalic.  Eyes: No scleral icterus.  Neck: Normal range of motion. Neck supple. No JVD present. No tracheal deviation present.  Cardiovascular: Normal rate and regular rhythm.  Exam reveals no gallop and no friction rub.   Murmur heard. Pulmonary/Chest: Effort normal and breath sounds normal. No respiratory distress. She has no  wheezes. She has no rales. She exhibits no tenderness.  Abdominal: Soft. Bowel sounds are normal. She exhibits no distension and no mass. There is no tenderness. There is no rebound and no guarding.  Musculoskeletal: Normal range of motion. She exhibits no edema.  Right AKA  Neurological: She is alert and oriented to person, place, and time.  Skin: Skin is warm. No rash noted. No erythema.  Psychiatric: Affect and judgment normal.      LABORATORY PANEL:   CBC  Recent Labs Lab 08/22/15 0531  WBC 7.1  HGB 11.3*  HCT 36.5  PLT 152   ------------------------------------------------------------------------------------------------------------------  Chemistries   Recent Labs Lab 08/20/15 0432  08/23/15 0622  NA 136  < > 139  K 5.5*  < > 4.7  CL 92*  < > 93*  CO2 35*  < > 41*  GLUCOSE 194*  < > 177*  BUN 47*  < > 78*  CREATININE 1.95*  < > 1.84*  CALCIUM 8.8*  < > 8.2*  MG 2.3  --   --   < > = values in this interval not displayed. ------------------------------------------------------------------------------------------------------------------  Cardiac Enzymes  Recent Labs Lab 08/22/15 0531 08/22/15 1208 08/22/15 1733  TROPONINI 4.37* 2.77* 3.61*   ------------------------------------------------------------------------------------------------------------------  RADIOLOGY:  Dg Chest 1 View  08/23/2015  CLINICAL DATA:  History of pneumothorax. EXAM: CHEST 1 VIEW COMPARISON:  08/21/2005 FINDINGS: Sternotomy wires overlie stable enlarged cardiac silhouette. There is central venous congestion and mild pulmonary edema pattern slight increased from prior. Mild LEFT basilar atelectasis. LEFT basilar atelectasis is unchanged. No pneumothorax. IMPRESSION: Cardiomegaly and mild increase in central pulmonary edema pattern. LEFT basilar atelectasis versus infiltrate is slightly  improved. Electronically Signed   By: Genevive Bi M.D.   On: 08/23/2015 09:04   Dg Chest 1  View  08/22/2015  CLINICAL DATA:  Left thoracentesis performed earlier today. EXAM: CHEST 1 VIEW 1650 hr: COMPARISON:  Portable chest x-ray earlier same date 0708 hr. FINDINGS: Essential resolution of the left pleural effusion after thoracentesis. Possible small (5% or less) left apical pneumothorax. Markedly improved aeration in the lingula and left lower lobe, with mild left lower lobe atelectasis persisting. Linear atelectasis in the mid left upper lobe. Interstitial and airspace pulmonary edema has improved significantly since the examination earlier today, with only mild interstitial edema persisting. IMPRESSION: 1. Essential resolution of the left pleural effusion after thoracentesis. Possible small (5% or less) left apical pneumothorax. 2. Improved aeration in the left upper lobe and left lower lobe, with mild left lower lobe atelectasis persisting. 3. Significant improvement in interstitial and airspace pulmonary edema since earlier today, with only mild interstitial edema persisting. Electronically Signed   By: Hulan Saas M.D.   On: 08/22/2015 17:08   Dg Chest 1 View  08/22/2015  CLINICAL DATA:  CHF.  Cough. EXAM: CHEST 1 VIEW COMPARISON:  08/20/2015. FINDINGS: Left PICC line in stable position. Prior CABG. Cardiomegaly with pulmonary venous congestion bilateral pulmonary alveolar infiltrates again noted consistent congestive heart failure. Slight interim progression. Prominent left pleural effusion, progressed from prior exam. No pneumothorax. IMPRESSION: 1. Prior CABG. Progressive changes of congestive heart failure and pulmonary edema. 2. Progressive left pleural effusion . 3. Left PICC line in stable position. Electronically Signed   By: Maisie Fus  Register   On: 08/22/2015 08:12   US Thoracentesis Asp Pleural Space W/img Guide  08/22/2015  CLINICAL DATA:  Shortness of breath and left pleural effusion. EXAM: ULTRASOUND GUIDED LEFT THORACENTESIS COMPARISON:  None. PROCEDURE: An ultrasound  guided thoracentesis was thoroughly discussed with the patient and questions answered. The benefits, risks, alternatives and complications were also discussed. The patient understands and wishes to proceed with the procedure. Written consent was obtained. Ultrasound was performed to localize and mark an adequate pocket of fluid in the left posterior chest. The area was then prepped and draped in the normal sterile fashion. 1% Lidocaine was used for local anesthesia. Under ultrasound guidance a Safe-T-Centesis catheter was introduced. Thoracentesis was performed. The catheter was removed and a dressing applied. COMPLICATIONS: None. FINDINGS: A total of approximately 800 mL of clear pink fluid was removed. A fluid sample was sent for laboratory analysis. IMPRESSION: Successful ultrasound guided left thoracentesis yielding 800 mL of pleural fluid. Electronically Signed   By: Richarda Overlie M.D.   On: 08/22/2015 17:03     ASSESSMENT AND PLAN:    81 year old female with a history of coronary artery disease and diastolic heart failure who presented with shortness of breath and atrial fibrillation with RVR.  1. Atrial fibrillation with RVR: Patient's heart rates have now improved. Continue metoprolol and diltiazem. Patient remains in atrial fibrillation and would benefit from anticoagulation due to her increased risk of stroke. I left a message with the patient's daughter about possibly starting anticoagulation.  2. Non-ST elevation: Appreciate cardiology consultation. Due to patient's respiratory status she was unable to undergo cardiac catheterization. Stop heparin drip 12/10. Continue Imdur, metoprolol, aspirin and atorvastatin. They recommended medical management.  3. Acute on chronic diastolic heart failure: Patient's ejection fraction was 50-55%. She has moderate TR on echocardiogram.  Continue Lasix IV. I suspect she may have had a component of tachycardia induced pulmonary edema.  4. Acute on chronic  hypoxic respiratory failure: This is secondary to the congestive heart failure along with atrial fibrillation and COPD exacerbation. Patient's oxygen is now back on her baseline 2 L. She is s/p thoracentesis on 08/22/2015 with 800 mL of pinkish fluid removed. Chest x-ray today shows no evidence of pneumothorax. Continue treatment for COPD and CHF as well as atrial fibrillation. Pulmonary consultation appreciated.  6. COPD exacerbation: Wean to oral steroids as tolerated and continue nebulizers and inhalers. continue Rocephin and azithromycin through Sunday.   7. Diabetes: Continue Levemir and sliding scale insulin. A1c 6.6   8. Acute on chronic kidney disease stage III with hyperkalemia and proteinuria: Acute renal failure secondary to acute cardiorenal syndrome. As per nephrology consultation creatinine fluctuates between 1.1-3.2. Most likely baseline creatinine is 2. Hold nephrotoxic agents. Continue to monitor creatinine. Creatinine has imprved  9. Hyperkalemia: Avoid potassium supplements. Potassium is elevated in the setting of renal failure.   Management plans discussed with the patient and family and she is in agreement.  CODE STATUS: FULL  TOTAL TIME TAKING CARE OF THIS PATIENT: 25 minutes.     POSSIBLE D/C tomorrow , DEPENDING ON CLINICAL CONDITION to SNF   Sammye Staff M.D on 08/23/2015 at 9:48 AM  Between 7am to 6pm - Pager - [phone] After 6pm go to [email or web address] - password [email or web address] [facility]  [facility]  Office  [phone]  CC: Primary care physician; Danella PentonMILLER,MARK F., MD  Note: This dictation was prepared with Dragon dictation along with smaller phrase technology. Any transcriptional errors that result from this process are unintentional.

## 2015-08-23 NOTE — Progress Notes (Signed)
[facility]  ROUNDING NOTE   Subjective:   Furosemide IV  q12 - now with metabolic alkalosis Creatinine 0.45 (2.24) (2.03) (1.95) K 4.7 today Ultrasound-guided thoracentesis done on December 9.  800 cc of pink colored fluid was removed. Patient feels better after the procedure but still reports that she has difficulty clearing her sputum   Objective:  Vital signs in last 24 hours:  Temp:  [97.4 F (36.3 C)-97.7 F (36.5 C)] 97.4 F (36.3 C) (12/10 1029) Pulse Rate:  [68-77] 71 (12/10 1029) Resp:  [16-20] 16 (12/10 1029) BP: (97-140)/(56-70) 140/56 mmHg (12/10 1029) SpO2:  [91 %-99 %] 99 % (12/10 1029) Weight:  [94.348 kg (208 lb)] 94.348 kg (208 lb) (12/10 0432)  Weight change: -0.907 kg (-2 lb) Filed Weights   08/21/15 0537 08/22/15 0522 08/23/15 0432  Weight: 95.21 kg (209 lb 14.4 oz) 95.255 kg (210 lb) 94.348 kg (208 lb)    Intake/Output: I/O last 3 completed shifts: In: 486.3 [P.O.:240; I.V.:146.3; IV Piggyback:100] Out: 1900 [Urine:1900]   Intake/Output this shift:  Total I/O In: 480 [P.O.:480] Out: 500 [Urine:500]  Physical Exam: General: NAD, cushingoid appearance  Head: Normocephalic, atraumatic. Moist oral mucosal membranes  Eyes: Anicteric,   Neck: Supple, trachea midline  Lungs:  Bibasilar crackles , mild diffuse wheezing  Heart: irregular  Abdomen:  Soft, nontender, obese  Extremities:  ++ peripheral edema, left AKA  Neurologic: Nonfocal, moving all four extremities  Skin: No acute lesions       Basic Metabolic Panel:  Recent Labs Lab 08/19/15 0813 08/20/15 0432 08/20/15 1254 08/21/15 0621 08/22/15 0531 08/23/15 0622  NA 137 136  --  136 138 139  K 5.0 5.5* 5.6* 5.3* 5.3* 4.7  CL 94* 92*  --  92* 91* 93*  CO2 34* 35*  --  37* 39* 41*  GLUCOSE 331* 194*  --  174* 169* 177*  BUN 40* 47*  --  62* 74* 78*  CREATININE 1.69* 1.95*  --  2.03* 2.24* 1.84*  CALCIUM 8.6* 8.8*  --  8.4* 8.3* 8.2*  MG  --  2.3  --   --   --    --     Liver Function Tests: No results for input(s): AST, ALT, ALKPHOS, BILITOT, PROT, ALBUMIN in the last 168 hours. No results for input(s): LIPASE, AMYLASE in the last 168 hours. No results for input(s): AMMONIA in the last 168 hours.  CBC:  Recent Labs Lab 08/18/15 1127 08/19/15 0813 08/20/15 0432 08/21/15 0621 08/22/15 0531  WBC 6.5 5.3 10.2 8.1 7.1  NEUTROABS 5.1  --   --   --   --   HGB 11.8* 11.2* 11.9* 12.1 11.3*  HCT 37.8 36.0 37.8 37.4 36.5  MCV 90.9 91.3 91.5 91.5 91.4  PLT 150 158 160 145* 152    Cardiac Enzymes:  Recent Labs Lab 08/18/15 1750 08/19/15 0001 08/22/15 0531 08/22/15 1208 08/22/15 1733  TROPONINI 9.19* 18.09* 4.37* 2.77* 3.61*    BNP: Invalid input(s): POCBNP  CBG:  Recent Labs Lab 08/22/15 0731 08/22/15 1123 08/22/15 1715 08/22/15 2059 08/23/15 1127  GLUCAP 210* 160* 160* 229* 155*    Microbiology: Results for orders placed or performed during the hospital encounter of 08/18/15  MRSA PCR Screening     Status: None   Collection Time: 08/18/15  9:36 PM  Result Value Ref Range Status   MRSA by PCR NEGATIVE NEGATIVE Final    Comment:        The GeneXpert MRSA  Assay (FDA approved for NASAL specimens only), is one component of a comprehensive MRSA colonization surveillance program. It is not intended to diagnose MRSA infection nor to guide or monitor treatment for MRSA infections.   Body fluid culture     Status: None (Preliminary result)   Collection Time: 08/22/15  4:15 PM  Result Value Ref Range Status   Specimen Description PLEURAL  Final   Special Requests NONE  Final   Gram Stain PENDING  Incomplete   Culture NO GROWTH < 24 HOURS  Final   Report Status PENDING  Incomplete    Coagulation Studies: No results for input(s): LABPROT, INR in the last 72 hours.  Urinalysis:  Recent Labs  08/21/15 2120  COLORURINE YELLOW*  LABSPEC 1.010  PHURINE 5.0  GLUCOSEU NEGATIVE  HGBUR 1+*  BILIRUBINUR NEGATIVE   KETONESUR NEGATIVE  PROTEINUR NEGATIVE  NITRITE NEGATIVE  LEUKOCYTESUR NEGATIVE      Imaging: Dg Chest 1 View  08/23/2015  CLINICAL DATA:  History of pneumothorax. EXAM: CHEST 1 VIEW COMPARISON:  08/21/2005 FINDINGS: Sternotomy wires overlie stable enlarged cardiac silhouette. There is central venous congestion and mild pulmonary edema pattern slight increased from prior. Mild LEFT basilar atelectasis. LEFT basilar atelectasis is unchanged. No pneumothorax. IMPRESSION: Cardiomegaly and mild increase in central pulmonary edema pattern. LEFT basilar atelectasis versus infiltrate is slightly improved. Electronically Signed   By: Genevive Bi M.D.   On: 08/23/2015 09:04   Dg Chest 1 View  08/22/2015  CLINICAL DATA:  Left thoracentesis performed earlier today. EXAM: CHEST 1 VIEW 1650 hr: COMPARISON:  Portable chest x-ray earlier same date 0708 hr. FINDINGS: Essential resolution of the left pleural effusion after thoracentesis. Possible small (5% or less) left apical pneumothorax. Markedly improved aeration in the lingula and left lower lobe, with mild left lower lobe atelectasis persisting. Linear atelectasis in the mid left upper lobe. Interstitial and airspace pulmonary edema has improved significantly since the examination earlier today, with only mild interstitial edema persisting. IMPRESSION: 1. Essential resolution of the left pleural effusion after thoracentesis. Possible small (5% or less) left apical pneumothorax. 2. Improved aeration in the left upper lobe and left lower lobe, with mild left lower lobe atelectasis persisting. 3. Significant improvement in interstitial and airspace pulmonary edema since earlier today, with only mild interstitial edema persisting. Electronically Signed   By: Hulan Saas M.D.   On: 08/22/2015 17:08   Dg Chest 1 View  08/22/2015  CLINICAL DATA:  CHF.  Cough. EXAM: CHEST 1 VIEW COMPARISON:  08/20/2015. FINDINGS: Left PICC line in stable position. Prior  CABG. Cardiomegaly with pulmonary venous congestion bilateral pulmonary alveolar infiltrates again noted consistent congestive heart failure. Slight interim progression. Prominent left pleural effusion, progressed from prior exam. No pneumothorax. IMPRESSION: 1. Prior CABG. Progressive changes of congestive heart failure and pulmonary edema. 2. Progressive left pleural effusion . 3. Left PICC line in stable position. Electronically Signed   By: Maisie Fus  Register   On: 08/22/2015 08:12   US Thoracentesis Asp Pleural Space W/img Guide  08/22/2015  CLINICAL DATA:  Shortness of breath and left pleural effusion. EXAM: ULTRASOUND GUIDED LEFT THORACENTESIS COMPARISON:  None. PROCEDURE: An ultrasound guided thoracentesis was thoroughly discussed with the patient and questions answered. The benefits, risks, alternatives and complications were also discussed. The patient understands and wishes to proceed with the procedure. Written consent was obtained. Ultrasound was performed to localize and mark an adequate pocket of fluid in the left posterior chest. The area was then prepped and draped  in the normal sterile fashion. 1% Lidocaine was used for local anesthesia. Under ultrasound guidance a Safe-T-Centesis catheter was introduced. Thoracentesis was performed. The catheter was removed and a dressing applied. COMPLICATIONS: None. FINDINGS: A total of approximately 800 mL of clear pink fluid was removed. A fluid sample was sent for laboratory analysis. IMPRESSION: Successful ultrasound guided left thoracentesis yielding 800 mL of pleural fluid. Electronically Signed   By: Richarda OverlieAdam  Henn M.D.   On: 08/22/2015 17:03     Medications:     . allopurinol  100 mg Oral Daily  . ALPRAZolam  0.25 mg Oral TID  . apixaban  2.5 mg Oral BID  . aspirin EC  81 mg Oral Daily  . atorvastatin  20 mg Oral QHS  . azelastine  2 spray Each Nare BID  . azithromycin  250 mg Oral Daily  . cefTRIAXone (ROCEPHIN)  IV  1 g Intravenous Q24H  .  diltiazem  120 mg Oral Daily  . docusate sodium  100 mg Oral BID  . escitalopram  10 mg Oral Daily  . ferrous sulfate  325 mg Oral BID  . furosemide  40 mg Intravenous BID  . gabapentin  300 mg Oral BID  . insulin aspart  0-15 Units Subcutaneous TID WC  . insulin aspart  0-5 Units Subcutaneous QHS  . insulin detemir  42 Units Subcutaneous QHS  . isosorbide mononitrate  60 mg Oral Daily  . magnesium oxide  400 mg Oral Daily  . metoprolol tartrate  25 mg Oral TID  . multivitamin with minerals  1 tablet Oral Daily  . pantoprazole  40 mg Oral BID  . polyethylene glycol  17 g Oral QHS  . [START ON 08/24/2015] predniSONE  60 mg Oral Q breakfast  . senna-docusate  1 tablet Oral BID  . temazepam  15 mg Oral QHS  . tiotropium  18 mcg Inhalation Daily   ipratropium-albuterol, oxyCODONE-acetaminophen, sodium chloride  Assessment/ Plan:  [name] is a 81 y.o. white  female with diabetes mellitus type II Insulin Dependent, coronary artery disease status post CABG, congestive heart failure diastolic echo on 16/112/6, left AKA, peripheral vascular disease, right humeral fracture, was admitted on 08/18/2015  Echo shows moderate tricuspid regurgitation, EF 50-55%  1. Acute Renal Failure on chronic kidney disease stage III with hyperkalemia and proteinuria: Acute renal failure due to acute cardiorenal syndrome. Seems that her creatinine fluctuates greatly with fluid status consistent with cardiorenal syndrome. Creatinine readings of 1.1-3.2. Most likely creatinine 2 is the baseline.  However now with metabolic alkalosis due to furosemide. S Cr trend is improving   2. Hypertension, congestive heart failure, peripheral edema - diastolic with acute exacerbation and atrial fibrillation with rapid ventricular response - continue IV Lasix to assist with volume optimization - Patient is currently on prednisone which will cause further fluid retention  3. Diabetes mellitus type II with chronic kidney  disease: insulin dependent. Hemoglobin A1c 6.6%.  - Continue glucose control.      LOS: 5 Lonia Roane 12/10/20161:18 PM

## 2015-08-23 NOTE — Progress Notes (Signed)
Pt complains of cough. MD notified. Orders received. Will continue to assess.

## 2015-08-23 NOTE — Progress Notes (Signed)
PT Cancellation Note  Patient Details Name: [name] MRN: [id number] DOB: December 03, 1924   Cancelled Treatment:    Reason Eval/Treat Not Completed: Medical issues which prohibited therapy. Patient had increased troponin levels today.  Ezekiel InaKristine S Billie Intriago, PT, DPT  [place], Barkley BrunsKristine S 08/23/2015, 3:12 PM

## 2015-08-23 NOTE — Progress Notes (Signed)
Order to remove picc line. Picc line removed per policy. Pt tolerated procedure. 44 marked on line removed. New dressing is clean dry and intact.

## 2015-08-24 LAB — BASIC METABOLIC PANEL
ANION GAP: 8 (ref 5–15)
BUN: 76 mg/dL — AB (ref 6–20)
CHLORIDE: 90 mmol/L — AB (ref 101–111)
CO2: 43 mmol/L — AB (ref 22–32)
Calcium: 8.7 mg/dL — ABNORMAL LOW (ref 8.9–10.3)
Creatinine, Ser: 1.54 mg/dL — ABNORMAL HIGH (ref 0.44–1.00)
GFR calc Af Amer: 34 mL/min — ABNORMAL LOW (ref >60)
GFR calc non Af Amer: 29 mL/min — ABNORMAL LOW (ref >60)
GLUCOSE: 122 mg/dL — AB (ref 65–99)
POTASSIUM: 4.4 mmol/L (ref 3.5–5.1)
Sodium: 141 mmol/L (ref 135–145)

## 2015-08-24 LAB — CBC
HEMATOCRIT: 34.1 % — AB (ref 35.0–47.0)
HEMOGLOBIN: 11.1 g/dL — AB (ref 12.0–16.0)
MCH: 29.1 pg (ref 26.0–34.0)
MCHC: 32.4 g/dL (ref 32.0–36.0)
MCV: 89.9 fL (ref 80.0–100.0)
Platelets: 131 10*3/uL — ABNORMAL LOW (ref 150–440)
RBC: 3.79 MIL/uL — ABNORMAL LOW (ref 3.80–5.20)
RDW: 17.9 % — AB (ref 11.5–14.5)
WBC: 6.2 10*3/uL (ref 3.6–11.0)

## 2015-08-24 LAB — GLUCOSE, CAPILLARY
Glucose-Capillary: 67 mg/dL (ref 65–99)
Glucose-Capillary: 94 mg/dL (ref 65–99)

## 2015-08-24 MED ORDER — APIXABAN 2.5 MG PO TABS: 2.5000 mg | ORAL_TABLET | Freq: Two times a day (BID) | ORAL | Status: AC

## 2015-08-24 MED ORDER — PREDNISONE 10 MG PO TABS: 10.0000 mg | ORAL_TABLET | Freq: Every day | ORAL | Status: DC

## 2015-08-24 MED ORDER — FUROSEMIDE 20 MG PO TABS: ORAL_TABLET | ORAL | Status: DC

## 2015-08-24 MED ORDER — DILTIAZEM HCL ER COATED BEADS 120 MG PO CP24: 120.0000 mg | ORAL_CAPSULE | Freq: Every day | ORAL | Status: AC

## 2015-08-24 MED ORDER — TIOTROPIUM BROMIDE MONOHYDRATE 18 MCG IN CAPS: 18.0000 ug | ORAL_CAPSULE | Freq: Every day | RESPIRATORY_TRACT | Status: DC

## 2015-08-24 NOTE — Progress Notes (Signed)
[facility]  ROUNDING NOTE   Subjective:   Furosemide IV 40mg  q12 - now with metabolic alkalosis Creatinine 1.61(0.961.54(1.84) (2.24) (2.03) (1.95) K 4.4 today Ultrasound-guided thoracentesis done on December 9.  800 cc of pink colored fluid was removed. Patient feels better after the procedure but still reports that she has difficulty clearing her sputum S Cr continues to improve   Objective:  Vital signs in last 24 hours:  Temp:  [97.8 F (36.6 C)-97.9 F (36.6 C)] 97.9 F (36.6 C) (12/11 0456) Pulse Rate:  [66-78] 66 (12/11 0456) Resp:  [16-20] 20 (12/11 0456) BP: (127-137)/(59-71) 137/59 mmHg (12/11 0456) SpO2:  [95 %-99 %] 95 % (12/11 0456) Weight:  [94.303 kg (207 lb 14.4 oz)] 94.303 kg (207 lb 14.4 oz) (12/11 0456)  Weight change: -0.045 kg (-1.6 oz) Filed Weights   08/22/15 0522 08/23/15 0432 08/24/15 0456  Weight: 95.255 kg (210 lb) 94.348 kg (208 lb) 94.303 kg (207 lb 14.4 oz)    Intake/Output: I/O last 3 completed shifts: In: 720 [P.O.:720] Out: 4150 [Urine:4150]   Intake/Output this shift:  Total I/O In: 240 [P.O.:240] Out: 0   Physical Exam: General: NAD, cushingoid appearance, sitting up in chair  Head: Normocephalic, atraumatic. Moist oral mucosal membranes  Eyes: Anicteric,   Neck: Supple, trachea midline  Lungs:  Bibasilar crackles , mild diffuse wheezing  Heart: irregular  Abdomen:  Soft, nontender, obese  Extremities:  ++ generalized edema, Rt AKA  Neurologic: Nonfocal,    Skin: No acute lesions       Basic Metabolic Panel:  Recent Labs Lab 08/20/15 0432 08/20/15 1254 08/21/15 0621 08/22/15 0531 08/23/15 0622 08/24/15 0433  NA 136  --  136 138 139 141  K 5.5* 5.6* 5.3* 5.3* 4.7 4.4  CL 92*  --  92* 91* 93* 90*  CO2 35*  --  37* 39* 41* 43*  GLUCOSE 194*  --  174* 169* 177* 122*  BUN 47*  --  62* 74* 78* 76*  CREATININE 1.95*  --  2.03* 2.24* 1.84* 1.54*  CALCIUM 8.8*  --  8.4* 8.3* 8.2* 8.7*  MG 2.3  --   --   --   --    --     Liver Function Tests: No results for input(s): AST, ALT, ALKPHOS, BILITOT, PROT, ALBUMIN in the last 168 hours. No results for input(s): LIPASE, AMYLASE in the last 168 hours. No results for input(s): AMMONIA in the last 168 hours.  CBC:  Recent Labs Lab 08/18/15 1127 08/19/15 0813 08/20/15 0432 08/21/15 0621 08/22/15 0531 08/24/15 0433  WBC 6.5 5.3 10.2 8.1 7.1 6.2  NEUTROABS 5.1  --   --   --   --   --   HGB 11.8* 11.2* 11.9* 12.1 11.3* 11.1*  HCT 37.8 36.0 37.8 37.4 36.5 34.1*  MCV 90.9 91.3 91.5 91.5 91.4 89.9  PLT 150 158 160 145* 152 131*    Cardiac Enzymes:  Recent Labs Lab 08/18/15 1750 08/19/15 0001 08/22/15 0531 08/22/15 1208 08/22/15 1733  TROPONINI 9.19* 18.09* 4.37* 2.77* 3.61*    BNP: Invalid input(s): POCBNP  CBG:  Recent Labs Lab 08/22/15 2059 08/23/15 1127 08/23/15 1631 08/24/15 0743 08/24/15 0819  GLUCAP 229* 155* 186* 67 94    Microbiology: Results for orders placed or performed during the hospital encounter of 08/18/15  MRSA PCR Screening     Status: None   Collection Time: 08/18/15  9:36 PM  Result Value Ref Range Status   MRSA by  PCR NEGATIVE NEGATIVE Final    Comment:        The GeneXpert MRSA Assay (FDA approved for NASAL specimens only), is one component of a comprehensive MRSA colonization surveillance program. It is not intended to diagnose MRSA infection nor to guide or monitor treatment for MRSA infections.   Body fluid culture     Status: None (Preliminary result)   Collection Time: 08/22/15  4:15 PM  Result Value Ref Range Status   Specimen Description PLEURAL  Final   Special Requests NONE  Final   Gram Stain NO WBC SEEN NO ORGANISMS SEEN   Final   Culture NO GROWTH 2 DAYS  Final   Report Status PENDING  Incomplete    Coagulation Studies: No results for input(s): LABPROT, INR in the last 72 hours.  Urinalysis:  Recent Labs  08/21/15 2120  COLORURINE YELLOW*  LABSPEC 1.010  PHURINE 5.0   GLUCOSEU NEGATIVE  HGBUR 1+*  BILIRUBINUR NEGATIVE  KETONESUR NEGATIVE  PROTEINUR NEGATIVE  NITRITE NEGATIVE  LEUKOCYTESUR NEGATIVE      Imaging: Dg Chest 1 View  08/23/2015  CLINICAL DATA:  History of pneumothorax. EXAM: CHEST 1 VIEW COMPARISON:  08/21/2005 FINDINGS: Sternotomy wires overlie stable enlarged cardiac silhouette. There is central venous congestion and mild pulmonary edema pattern slight increased from prior. Mild LEFT basilar atelectasis. LEFT basilar atelectasis is unchanged. No pneumothorax. IMPRESSION: Cardiomegaly and mild increase in central pulmonary edema pattern. LEFT basilar atelectasis versus infiltrate is slightly improved. Electronically Signed   By: Genevive Bi M.D.   On: 08/23/2015 09:04   Dg Chest 1 View  08/22/2015  CLINICAL DATA:  Left thoracentesis performed earlier today. EXAM: CHEST 1 VIEW 1650 hr: COMPARISON:  Portable chest x-ray earlier same date 0708 hr. FINDINGS: Essential resolution of the left pleural effusion after thoracentesis. Possible small (5% or less) left apical pneumothorax. Markedly improved aeration in the lingula and left lower lobe, with mild left lower lobe atelectasis persisting. Linear atelectasis in the mid left upper lobe. Interstitial and airspace pulmonary edema has improved significantly since the examination earlier today, with only mild interstitial edema persisting. IMPRESSION: 1. Essential resolution of the left pleural effusion after thoracentesis. Possible small (5% or less) left apical pneumothorax. 2. Improved aeration in the left upper lobe and left lower lobe, with mild left lower lobe atelectasis persisting. 3. Significant improvement in interstitial and airspace pulmonary edema since earlier today, with only mild interstitial edema persisting. Electronically Signed   By: Hulan Saas M.D.   On: 08/22/2015 17:08   US Thoracentesis Asp Pleural Space W/img Guide  08/22/2015  CLINICAL DATA:  Shortness of breath and  left pleural effusion. EXAM: ULTRASOUND GUIDED LEFT THORACENTESIS COMPARISON:  None. PROCEDURE: An ultrasound guided thoracentesis was thoroughly discussed with the patient and questions answered. The benefits, risks, alternatives and complications were also discussed. The patient understands and wishes to proceed with the procedure. Written consent was obtained. Ultrasound was performed to localize and mark an adequate pocket of fluid in the left posterior chest. The area was then prepped and draped in the normal sterile fashion. 1% Lidocaine was used for local anesthesia. Under ultrasound guidance a Safe-T-Centesis catheter was introduced. Thoracentesis was performed. The catheter was removed and a dressing applied. COMPLICATIONS: None. FINDINGS: A total of approximately 800 mL of clear pink fluid was removed. A fluid sample was sent for laboratory analysis. IMPRESSION: Successful ultrasound guided left thoracentesis yielding 800 mL of pleural fluid. Electronically Signed   By: Meriel Pica.D.  On: 08/22/2015 17:03     Medications:     . allopurinol  100 mg Oral Daily  . ALPRAZolam  0.25 mg Oral TID  . apixaban  2.5 mg Oral BID  . aspirin EC  81 mg Oral Daily  . atorvastatin  20 mg Oral QHS  . azelastine  2 spray Each Nare BID  . cefTRIAXone (ROCEPHIN)  IV  1 g Intravenous Q24H  . diltiazem  120 mg Oral Daily  . docusate sodium  100 mg Oral BID  . escitalopram  10 mg Oral Daily  . ferrous sulfate  325 mg Oral BID  . furosemide  40 mg Intravenous BID  . gabapentin  300 mg Oral BID  . insulin aspart  0-15 Units Subcutaneous TID WC  . insulin aspart  0-5 Units Subcutaneous QHS  . insulin detemir  42 Units Subcutaneous QHS  . isosorbide mononitrate  60 mg Oral Daily  . magnesium oxide  400 mg Oral Daily  . metoprolol tartrate  25 mg Oral TID  . multivitamin with minerals  1 tablet Oral Daily  . pantoprazole  40 mg Oral BID  . polyethylene glycol  17 g Oral QHS  . predniSONE  60 mg Oral Q  breakfast  . senna-docusate  1 tablet Oral BID  . temazepam  15 mg Oral QHS  . tiotropium  18 mcg Inhalation Daily   guaiFENesin-dextromethorphan, ipratropium-albuterol, oxyCODONE-acetaminophen, sodium chloride  Assessment/ Plan:  [name] is a 81 y.o. white  female with diabetes mellitus type II Insulin Dependent, coronary artery disease status post CABG, congestive heart failure diastolic echo on 40/9, left AKA, peripheral vascular disease, right humeral fracture, was admitted on 08/18/2015  Echo shows moderate tricuspid regurgitation, EF 50-55%  1. Acute Renal Failure on chronic kidney disease stage III with hyperkalemia and proteinuria: Acute renal failure due to acute cardiorenal syndrome. Seems that her creatinine fluctuates greatly with fluid status consistent with cardiorenal syndrome. Creatinine readings of 1.1-3.2.   However now with metabolic alkalosis due to furosemide. S Cr trend is improving  Today's Cr 1.5  2. Hypertension, congestive heart failure, generalized edema - diastolic with acute exacerbation and atrial fibrillation with rapid ventricular response - continue Lasix to assist with volume optimization - Patient is currently on prednisone which will cause further fluid retention  3. Diabetes mellitus type II with chronic kidney disease: insulin dependent. Hemoglobin A1c 6.6%.  - Continue glucose control.   F/u as needed outpatient Business card provided to her daughter     LOS: 6 Michaelpaul Apo 12/11/201612:43 PM

## 2015-08-24 NOTE — Discharge Summary (Addendum)
[facility] [place] [facility]   PATIENT NAME: [name]    MR#:  [id number]  DATE OF BIRTH:  09/12/1925  DATE OF ADMISSION:  08/18/2015 ADMITTING PHYSICIAN: Altamese DillingVaibhavkumar Vachhani, MD  DATE OF DISCHARGE: 08/23/2015    PRIMARY CARE PHYSICIAN: Danella PentonMILLER,MARK F., MD    ADMISSION DIAGNOSIS:  CHF (congestive heart failure) (HCC) [I50.9] Dyspnea [R06.00] Atrial fibrillation with rapid ventricular response (HCC) [I48.91] Acute on chronic congestive heart failure, unspecified congestive heart failure type (HCC) [I50.9]  DISCHARGE DIAGNOSIS:  Principal Problem:   Atrial fibrillation with RVR (HCC) Active Problems:   CHF (congestive heart failure) (HCC)   SECONDARY DIAGNOSIS:   Past Medical History  Diagnosis Date  . Cancer (HCC)   . Blood transfusion without reported diagnosis   . CHF (congestive heart failure) (HCC)   . Coronary artery disease   . Hypertension   . Shortness of breath dyspnea   . Diabetes mellitus without complication (HCC)   . Peripheral vascular disease (HCC)   . Atrial fibrillation [facility](HCC)     HOSPITAL COURSE:   81 year old female with a history of coronary artery disease and diastolic heart failure who presented with shortness of breath and atrial fibrillation with RVR.  1. Atrial fibrillation with RVR: Patient's heart rates have now improved. Continue metoprolol and diltiazem. Patient remains in atrial fibrillation and would benefit from anticoagulation due to her increased risk of stroke. I discussed this with patient's daughter. After reviewing side effects, alternatives, risks and benefits she felt it was more beneficial for patient to start anticoagulation. .  2. Non-ST elevation: Her troponin max was 18. She was started on heparin gtt and CARDIOLOGY was consulted. Due to patient's respiratory status she was unable to undergo cardiac catheterization. Heparin drip was stopped on 12/10. Continue Imdur, metoprolol, aspirin and  atorvastatin. CARDIOLOGY is recommending aggressive medical management.  3. Acute on chronic diastolic heart failure: Patient's ejection fraction was 50-55%. She has moderate TR on echocardiogram.  She was diureses with Lasix IV and will be discharged on PO LASIX. I suspect she may have had a component of tachycardia induced pulmonary edema.  4. Acute on chronic hypoxic respiratory failure: This is secondary to the congestive heart failure along with atrial fibrillation and COPD exacerbation. Patient's oxygen is now back on her baseline 2 L. She is s/p thoracentesis on 08/22/2015 with 800 mL of pinkish fluid removed. Chest x-ray today shows no evidence of pneumothorax. Continue treatment for COPD and CHF as well as atrial fibrillation. Pulmonary consultation appreciated. She was on CPAP at night here but does need this at discharge.   6. COPD exacerbation: IV steroids were weaned to oral steroids. She was on Rocephin and azithromycin and is stopped today..   7. Diabetes: Continue Levemir and ADA diet.  8. Acute on chronic kidney disease stage III with hyperkalemia and proteinuria: Acute renal failure secondary to acute cardiorenal syndrome. As per nephrology consultation creatinine fluctuates between 1.1-3.2. Most likely baseline creatinine is 2. nephrotoxic agents were on hold.  Creatinine has improved to 1.8 this am. ALDACTONE is still on hold until follow up with nephrology.   DISCHARGE CONDITIONS AND DIET:   Stable Diabetic diet  CONSULTS OBTAINED:  Treatment Team:  Alwyn Peawayne D Callwood, MD Dalia HeadingKenneth A Fath, MD Lamont DowdySarath Kolluru, MD Yevonne PaxSaadat A Khan, MD  DRUG ALLERGIES:   Allergies  Allergen Reactions  . Dilaudid [Hydromorphone Hcl] Nausea And Vomiting  . Morphine And Related Other (See Comments)    Reaction:  Lightheadedness  Patient's daughter says she can take morphine.    DISCHARGE MEDICATIONS:   Current Discharge Medication List    START taking these medications   Details   apixaban (ELIQUIS) 2.5 MG TABS tablet Take 1 tablet (2.5 mg total) by mouth 2 (two) times daily. Qty: 60 tablet, Refills: 0    diltiazem (CARDIZEM CD) 120 MG 24 hr capsule Take 1 capsule (120 mg total) by mouth daily. Qty: 30 capsule, Refills: 0    predniSONE (DELTASONE) 10 MG tablet Take 1 tablet (10 mg total) by mouth daily with breakfast. Qty: 20 tablet, Refills: 0    tiotropium (SPIRIVA) 18 MCG inhalation capsule Place 1 capsule (18 mcg total) into inhaler and inhale daily. Qty: 30 capsule, Refills: 12      CONTINUE these medications which have CHANGED   Details  furosemide (LASIX) 20 MG tablet 40 mg in am and 20 mg at 1400 Qty: 30 tablet, Refills: 0      CONTINUE these medications which have NOT CHANGED   Details  acetaminophen (TYLENOL) 325 MG tablet Take 2 tablets (650 mg total) by mouth every 6 (six) hours as needed for mild pain (or Fever >/= 101).    allopurinol (ZYLOPRIM) 100 MG tablet Take 100 mg by mouth daily.     ALPRAZolam (XANAX) 0.25 MG tablet Take 1 tablet (0.25 mg total) by mouth 3 (three) times daily. Qty: 30 tablet, Refills: 0    aspirin EC 81 MG tablet Take 81 mg by mouth daily.    atorvastatin (LIPITOR) 20 MG tablet Take 20 mg by mouth at bedtime.    azelastine (ASTELIN) 0.1 % nasal spray Place 2 sprays into both nostrils 2 (two) times daily. Use in each nostril as directed    escitalopram (LEXAPRO) 5 MG tablet Take 1 tablet (5 mg total) by mouth daily.    ferrous sulfate 325 (65 FE) MG tablet Take 325 mg by mouth 2 (two) times daily.    gabapentin (NEURONTIN) 300 MG capsule Take 300 mg by mouth 3 (three) times daily.    insulin aspart (NOVOLOG) 100 UNIT/ML injection Inject 0-15 Units into the skin 3 (three) times daily with meals. Qty: 10 mL, Refills: 11    insulin detemir (LEVEMIR) 100 UNIT/ML injection Inject 0.42 mLs (42 Units total) into the skin at bedtime. Qty: 10 mL, Refills: 11    ipratropium-albuterol (DUONEB) 0.5-2.5 (3) MG/3ML SOLN  Take 3 mLs by nebulization every 6 (six) hours as needed (for shortness of breath).    isosorbide mononitrate (IMDUR) 60 MG 24 hr tablet Take 60 mg by mouth daily.    magnesium oxide (MAG-OX) 400 MG tablet Take 400 mg by mouth daily.    metoprolol tartrate (LOPRESSOR) 25 MG tablet Take 1 tablet (25 mg total) by mouth 3 (three) times daily. Qty: 1 tablet, Refills: 1    Multiple Vitamin (MULTIVITAMIN WITH MINERALS) TABS tablet Take 1 tablet by mouth daily.    oxyCODONE-acetaminophen (PERCOCET/ROXICET) 5-325 MG per tablet One to two tabs every four hours as needed for pain Qty: 30 tablet, Refills: 0    pantoprazole (PROTONIX) 40 MG tablet Take 1 tablet (40 mg total) by mouth 2 (two) times daily. Qty: 1 tablet, Refills: 1    polyethylene glycol (MIRALAX / GLYCOLAX) packet Take 17 g by mouth at bedtime.     senna-docusate (SENOKOT-S) 8.6-50 MG tablet Take 1 tablet by mouth 2 (two) times daily.    temazepam (RESTORIL) 15 MG capsule Take 1 capsule (15 mg total) by mouth  at bedtime. Qty: 30 capsule, Refills: 0    docusate sodium (COLACE) 100 MG capsule Take 1 capsule (100 mg total) by mouth 2 (two) times daily. Qty: 10 capsule, Refills: 0      STOP taking these medications     diltiazem (CARDIZEM) 30 MG tablet      spironolactone (ALDACTONE) 25 MG tablet      torsemide (DEMADEX) 10 MG tablet               Today   CHIEF COMPLAINT:  Patient doing well this am. No SOB or CP   VITAL SIGNS:  Blood pressure 137/59, pulse 66, temperature 97.9 F (36.6 C), temperature source Axillary, resp. rate 20, height  (1.499 m), weight 94.303 kg (207 lb 14.4 oz), SpO2 95 %.   REVIEW OF SYSTEMS:  Review of Systems  Constitutional: Negative for fever, chills and malaise/fatigue.  HENT: Negative for sore throat.   Eyes: Negative for blurred vision.  Respiratory: Negative for cough, hemoptysis, shortness of breath and wheezing.   Cardiovascular: Negative for chest pain,  palpitations and leg swelling.  Gastrointestinal: Negative for nausea, vomiting, abdominal pain, diarrhea and blood in stool.  Genitourinary: Negative for dysuria.  Musculoskeletal: Negative for back pain.  Neurological: Negative for dizziness, tremors and headaches.  Endo/Heme/Allergies: Does not bruise/bleed easily.     PHYSICAL EXAMINATION:  GENERAL:  81 y.o.-year-old patient lying in the bed with no acute distress.  NECK:  Supple, no jugular venous distention. No thyroid enlargement, no tenderness.  LUNGS: Normal breath sounds bilaterally, no wheezing, rales,rhonchi  No use of accessory muscles of respiration.  CARDIOVASCULAR: S1, S2 normal. No murmurs, rubs, or gallops.  ABDOMEN: Soft, non-tender, non-distended. Bowel sounds present. No organomegaly or mass.  EXTREMITIES: No pedal edema, cyanosis, or clubbing. Left AKA PSYCHIATRIC: The patient is alert and oriented x 3.  SKIN: No obvious rash, lesion, or ulcer.   DATA REVIEW:   CBC  Recent Labs Lab 08/24/15 0433  WBC 6.2  HGB 11.1*  HCT 34.1*  PLT 131*    Chemistries   Recent Labs Lab 08/20/15 0432  08/24/15 0433  NA 136  < > 141  K 5.5*  < > 4.4  CL 92*  < > 90*  CO2 35*  < > 43*  GLUCOSE 194*  < > 122*  BUN 47*  < > 76*  CREATININE 1.95*  < > 1.54*  CALCIUM 8.8*  < > 8.7*  MG 2.3  --   --   < > = values in this interval not displayed.  Cardiac Enzymes  Recent Labs Lab 08/22/15 0531 08/22/15 1208 08/22/15 1733  TROPONINI 4.37* 2.77* 3.61*    Microbiology Results  @  RADIOLOGY:  Dg Chest 1 View  08/23/2015  CLINICAL DATA:  History of pneumothorax. EXAM: CHEST 1 VIEW COMPARISON:  08/21/2005 FINDINGS: Sternotomy wires overlie stable enlarged cardiac silhouette. There is central venous congestion and mild pulmonary edema pattern slight increased from prior. Mild LEFT basilar atelectasis. LEFT basilar atelectasis is unchanged. No pneumothorax. IMPRESSION: Cardiomegaly and mild increase in  central pulmonary edema pattern. LEFT basilar atelectasis versus infiltrate is slightly improved. Electronically Signed   By: Genevive Bi M.D.   On: 08/23/2015 09:04   Dg Chest 1 View  08/22/2015  CLINICAL DATA:  Left thoracentesis performed earlier today. EXAM: CHEST 1 VIEW 1650 hr: COMPARISON:  Portable chest x-ray earlier same date 0708 hr. FINDINGS: Essential resolution of the left pleural effusion after thoracentesis. Possible small (5%  or less) left apical pneumothorax. Markedly improved aeration in the lingula and left lower lobe, with mild left lower lobe atelectasis persisting. Linear atelectasis in the mid left upper lobe. Interstitial and airspace pulmonary edema has improved significantly since the examination earlier today, with only mild interstitial edema persisting. IMPRESSION: 1. Essential resolution of the left pleural effusion after thoracentesis. Possible small (5% or less) left apical pneumothorax. 2. Improved aeration in the left upper lobe and left lower lobe, with mild left lower lobe atelectasis persisting. 3. Significant improvement in interstitial and airspace pulmonary edema since earlier today, with only mild interstitial edema persisting. Electronically Signed   By: Hulan Saas M.D.   On: 08/22/2015 17:08   US Thoracentesis Asp Pleural Space W/img Guide  08/22/2015  CLINICAL DATA:  Shortness of breath and left pleural effusion. EXAM: ULTRASOUND GUIDED LEFT THORACENTESIS COMPARISON:  None. PROCEDURE: An ultrasound guided thoracentesis was thoroughly discussed with the patient and questions answered. The benefits, risks, alternatives and complications were also discussed. The patient understands and wishes to proceed with the procedure. Written consent was obtained. Ultrasound was performed to localize and mark an adequate pocket of fluid in the left posterior chest. The area was then prepped and draped in the normal sterile fashion. 1% Lidocaine was used for local  anesthesia. Under ultrasound guidance a Safe-T-Centesis catheter was introduced. Thoracentesis was performed. The catheter was removed and a dressing applied. COMPLICATIONS: None. FINDINGS: A total of approximately 800 mL of clear pink fluid was removed. A fluid sample was sent for laboratory analysis. IMPRESSION: Successful ultrasound guided left thoracentesis yielding 800 mL of pleural fluid. Electronically Signed   By: Richarda Overlie M.D.   On: 08/22/2015 17:03      Management plans discussed with the patient and he is in agreement. Stable for discharge SNF  Patient should follow up with PCP, Cardiology, PULMONARY, and NEPHROLOGY  CODE STATUS:     Code Status Orders        Start     Ordered   08/18/15 1736  Do not attempt resuscitation (DNR)   Continuous    Question Answer Comment  In the event of cardiac or respiratory ARREST Do not call a "code blue"   In the event of cardiac or respiratory ARREST Do not perform Intubation, CPR, defibrillation or ACLS   In the event of cardiac or respiratory ARREST Use medication by any route, position, wound care, and other measures to relive pain and suffering. May use oxygen, suction and manual treatment of airway obstruction as needed for comfort.      08/18/15 1735      TOTAL TIME TAKING CARE OF THIS PATIENT: 40 minutes.    Note: This dictation was prepared with Dragon dictation along with smaller phrase technology. Any transcriptional errors that result from this process are unintentional.  Reeanna Acri M.D on 08/24/2015 at 8:32 AM  Between 7am to 6pm - Pager - [phone] After 6pm go to [email or web address] - password [email or web address] [facility]  [facility]  Office  [phone]  CC: Primary care physician; Danella Penton., MD

## 2015-08-24 NOTE — Clinical Social Work Note (Signed)
Patient to discharge back to [facility] today. [facility] at [facility] had to ensure that they had a bed for patient as they did not hold the bed. Jomarie LongsJoseph checked and did have a bed for patient. Patient is aware and in agreement with return. Patient wanted CSW to notify her daughter of discharge. CSW contacted her daughter, [name], and she is aware and in agreement but wanted patient to be on a low sodium diet. Patient's daughter aware she needed to call patient's nurse so that the MD order could be obtained if the MD wished to change patient's diet. CSW also has notified J[facility] LongsJoseph at [facility] that the daughter wants her mother on a low sodium diet. York SpanielMonica Kally Cadden MSW,LCSW [phone]

## 2015-08-24 NOTE — Progress Notes (Signed)
Pts CBG is 67, Orange juice given. Will repeat test and continue to monitor.

## 2015-08-24 NOTE — Progress Notes (Signed)
Repeat CBG is 94. I will continue to monitor. Food trays are on the unit.

## 2015-08-24 NOTE — Evaluation (Signed)
Physical Therapy Evaluation Patient Details Name: [name] MRN: [id number] DOB: 08-09-1925 Today's Date: 08/24/2015   History of Present Illness  81 yo Female came to ED on 1`10/18/14 with increase shortness of breath. Chest x-rays showed pneumona. Patient was also found to be in a-fib with elevated troponins. Patient was diagnosed with NSTEMI and cardiology considered doing a cardiac cath; Patient was unable to have cardiac cath due to medical complications. She is s/p RLE AKA in September 2016. She was at [facility] prior to admittance receiving rehab.   Clinical Impression  81 yo Female reports increased shortness of breath. Patient is s/p RLE AKA in September 2016 and was brought to ED from [facility] (SNF); Patient reports needing assistance for most ADLs prior to admittance. Patient reports doing sliding board transfers and spending most of time in the wheelchair. Patient is min A for bed mobility with elevated head of bead and bedrails. She is mod A for scoot/pivot transfer to bedside chair. Patient exhibits increased LLE weakness. She would benefit from additional skilled PT intervention to improve LE strength, functional mobility and balance.     Follow Up Recommendations SNF    Equipment Recommendations       Recommendations for Other Services       Precautions / Restrictions Precautions Precautions: Fall Restrictions Weight Bearing Restrictions: No Other Position/Activity Restrictions: RLE above knee amputee      Mobility  Bed Mobility Overal bed mobility: Needs Assistance Bed Mobility: Supine to Sit     Supine to sit: Min assist;HOB elevated     General bed mobility comments: with mod VCs for hand placement and to pull up with BUE;   Transfers Overall transfer level: Needs assistance Equipment used: None Transfers: Stand Pivot Transfers;Lateral/Scoot Transfers   Stand pivot transfers: Mod assist      Lateral/Scoot Transfers: Mod assist General  transfer comment: PT performed lateral scoot/partial stand pivot transfer with mod A +1 with mod VCs for hand placement and to increase right lateral scoot;   Ambulation/Gait             General Gait Details: unable to ambulate at this time;   Stairs            Wheelchair Mobility    Modified Rankin (Stroke Patients Only)       Balance Overall balance assessment: Needs assistance Sitting-balance support: Single extremity supported Sitting balance-Leahy Scale: Poor Sitting balance - Comments: demonstrates posterior trunk lean requiring min-mod A to maintain sitting posture;  Postural control: Posterior lean Standing balance support: Bilateral upper extremity supported Standing balance-Leahy Scale: Poor Standing balance comment: unable to fully stand due to weakness in LLE requiring mod A without AD for balance with scoot pivot transfer;                              Pertinent Vitals/Pain Pain Assessment: No/denies pain    Home Living Family/patient expects to be discharged to:: Skilled nursing facility   Available Help at Discharge: Skilled Nursing Facility Type of Home: Skilled Nursing Facility           Additional Comments: patient reports using sliding board to get to wheelchair; she spent most of her time sitting in wheelchair at [facility]    Prior Function Level of Independence: Needs assistance   Gait / Transfers Assistance Needed: needed assistance with all transfers; was not ambulator  ADL's / Homemaking Assistance Needed: needed assistance with all  ADLs, able to wash face independently and feed self;   Comments: [facility] prior to admittance. She has been recieving rehab for RLE AKA     Hand Dominance        Extremity/Trunk Assessment   Upper Extremity Assessment: RUE deficits/detail;Generalized weakness RUE Deficits / Details: s/p recent RUE shoulder fracture; decreased overhead Reach; shoulder  strength 3-/5, elbow 3/5, wrist/hand 3/5; intact light touch sensation;          Lower Extremity Assessment: RLE deficits/detail;LLE deficits/detail RLE Deficits / Details: RLE not assessed due to above knee amputee LLE Deficits / Details: hip 3-/5, knee 4/5, ankle 3/5; intact light touch sensation; ROM is WFL with exception of decreased hip flexion due to obesity;   Cervical / Trunk Assessment: Kyphotic  Communication   Communication: No difficulties  Cognition Arousal/Alertness: Awake/alert Behavior During Therapy: WFL for tasks assessed/performed Overall Cognitive Status: Within Functional Limits for tasks assessed                      General Comments General comments (skin integrity, edema, etc.): RLE is Above knee amputee (wearing shrinker at evaluation); LLE: increased scabs and closed wounds along left knee;     Exercises General Exercises - Lower Extremity Ankle Circles/Pumps: AROM;Left;10 reps;Seated Quad Sets: AROM;Left;10 reps;Seated Hip ABduction/ADduction: AROM;Left;10 reps;Seated Straight Leg Raises: AROM;Left;10 reps;Seated Other Exercises Other Exercises: Patient required min VCs to increase hold time with quad sets and tactile cues to increase ROM with SLR flexion on LLE for increased strengthening;       Assessment/Plan    PT Assessment Patient needs continued PT services  PT Diagnosis Generalized weakness;Difficulty walking   PT Problem List Decreased strength;Decreased safety awareness;Decreased activity tolerance;Decreased balance;Decreased mobility;Obesity  PT Treatment Interventions Functional mobility training;Therapeutic activities;Therapeutic exercise;Balance training;Neuromuscular re-education;Wheelchair mobility training;Patient/family education   PT Goals (Current goals can be found in the Care Plan section) Acute Rehab PT Goals Patient Stated Goal: [facility]" PT Goal Formulation: With  patient Time For Goal Achievement: 09/07/15 Potential to Achieve Goals: Good    Frequency Min 2X/week   Barriers to discharge        Co-evaluation               End of Session Equipment Utilized During Treatment: Gait belt Activity Tolerance: Patient tolerated treatment well Patient left: in chair;with chair alarm set           Time: [id number] PT Time Calculation (min) (ACUTE ONLY): 21 min   Charges:   PT Evaluation $Initial PT Evaluation Tier I: 1 Procedure PT Treatments $Therapeutic Exercise: 8-22 mins   PT G Codes:        Trotter,Margaret PT, DPT 08/24/2015, 10:44 AM

## 2015-08-25 LAB — BODY FLUID CULTURE
CULTURE: NO GROWTH
GRAM STAIN: NONE SEEN

## 2015-08-27 LAB — PATHOLOGIST SMEAR REVIEW

## 2015-09-08 LAB — PH, BODY FLUID: PH, BODY FLUID: 8.1

## 2015-09-11 ENCOUNTER — Encounter: Payer: Self-pay | Admitting: Family

## 2015-09-11 ENCOUNTER — Ambulatory Visit: Payer: Medicare Other | Attending: Family | Admitting: Family

## 2015-09-11 VITALS — BP 105/50 | HR 71 | Resp 18 | Ht 59.0 in | Wt 205.0 lb

## 2015-09-11 DIAGNOSIS — Z9889 Other specified postprocedural states: Secondary | ICD-10-CM | POA: Diagnosis not present

## 2015-09-11 DIAGNOSIS — I739 Peripheral vascular disease, unspecified: Secondary | ICD-10-CM | POA: Diagnosis not present

## 2015-09-11 DIAGNOSIS — Z888 Allergy status to other drugs, medicaments and biological substances status: Secondary | ICD-10-CM | POA: Insufficient documentation

## 2015-09-11 DIAGNOSIS — Z79899 Other long term (current) drug therapy: Secondary | ICD-10-CM | POA: Diagnosis not present

## 2015-09-11 DIAGNOSIS — Z794 Long term (current) use of insulin: Secondary | ICD-10-CM | POA: Insufficient documentation

## 2015-09-11 DIAGNOSIS — Z7901 Long term (current) use of anticoagulants: Secondary | ICD-10-CM | POA: Insufficient documentation

## 2015-09-11 DIAGNOSIS — I10 Essential (primary) hypertension: Secondary | ICD-10-CM

## 2015-09-11 DIAGNOSIS — Z89611 Acquired absence of right leg above knee: Secondary | ICD-10-CM | POA: Insufficient documentation

## 2015-09-11 DIAGNOSIS — J449 Chronic obstructive pulmonary disease, unspecified: Secondary | ICD-10-CM | POA: Insufficient documentation

## 2015-09-11 DIAGNOSIS — Z7982 Long term (current) use of aspirin: Secondary | ICD-10-CM | POA: Insufficient documentation

## 2015-09-11 DIAGNOSIS — I2510 Atherosclerotic heart disease of native coronary artery without angina pectoris: Secondary | ICD-10-CM | POA: Diagnosis not present

## 2015-09-11 DIAGNOSIS — E119 Type 2 diabetes mellitus without complications: Secondary | ICD-10-CM | POA: Insufficient documentation

## 2015-09-11 DIAGNOSIS — Z859 Personal history of malignant neoplasm, unspecified: Secondary | ICD-10-CM | POA: Insufficient documentation

## 2015-09-11 DIAGNOSIS — I110 Hypertensive heart disease with heart failure: Secondary | ICD-10-CM | POA: Diagnosis not present

## 2015-09-11 DIAGNOSIS — I5032 Chronic diastolic (congestive) heart failure: Secondary | ICD-10-CM

## 2015-09-11 DIAGNOSIS — J410 Simple chronic bronchitis: Secondary | ICD-10-CM

## 2015-09-11 DIAGNOSIS — I4891 Unspecified atrial fibrillation: Secondary | ICD-10-CM

## 2015-09-11 NOTE — Progress Notes (Signed)
Subjective:    Patient ID: [name], female    DOB: 1924/11/17, 81 y.o.   MRN: [id number]  Congestive Heart Failure Presents for initial visit. The disease course has been improving. Associated symptoms include edema, fatigue, orthopnea and shortness of breath (with minimal exertion). Pertinent negatives include no abdominal pain, chest pain, chest pressure or palpitations. The symptoms have been improving. Past treatments include beta blockers, salt and fluid restriction and oxygen. The treatment provided moderate relief. Compliance with prior treatments has been good. Her past medical history is significant for arrhythmia, CAD, chronic lung disease, DM and HTN. Compliance with total regimen is 76-100%. Compliance problems include adherence to diet (due to living [facility]).   Hypertension This is a chronic problem. The current episode started more than 1 year ago. The problem is unchanged. The problem is controlled. Associated symptoms include peripheral edema and shortness of breath (with minimal exertion). Pertinent negatives include no chest pain, headaches or palpitations. There are no associated agents to hypertension. Risk factors for coronary artery disease include diabetes mellitus, family history, obesity, post-menopausal state and sedentary lifestyle. Past treatments include beta blockers, calcium channel blockers and diuretics. The current treatment provides significant improvement. Compliance problems include exercise and diet.  Hypertensive end-organ damage includes kidney disease, CAD/MI and heart failure.  Atrial Fibrillation Presents for follow-up visit. Symptoms include hypertension, shortness of breath (with minimal exertion) and weakness. Symptoms are negative for bradycardia, chest pain, dizziness, palpitations and tachycardia. The symptoms have been stable. Past treatments include anticoagulant, beta blockers and Ca channel blockers. Compliance with prior treatments  has been good. Past medical history includes atrial fibrillation, CAD, CHF and HTN. There are no medication compliance problems.   Past Medical History  Diagnosis Date  . Cancer (HCC)   . Blood transfusion without reported diagnosis   . CHF (congestive heart failure) (HCC)   . Coronary artery disease   . Hypertension   . Shortness of breath dyspnea   . Diabetes mellitus without complication (HCC)   . Peripheral vascular disease (HCC)   . Atrial fibrillation [facility]HCC)     Past Surgical History  Procedure Laterality Date  . Back surgery    . Coronary artery bypass graft    . Vascular surgery    . Peripheral vascular catheterization Right 05/27/2015    Procedure: Lower Extremity Angiography;  Surgeon: Renford DillsGregory G Schnier, MD;  Location: [facility] INVASIVE CV LAB;  Service: Cardiovascular;  Laterality: Right;  . Peripheral vascular catheterization  05/27/2015    Procedure: Lower Extremity Intervention;  Surgeon: Renford DillsGregory G Schnier, MD;  Location: [facility] INVASIVE CV LAB;  Service: Cardiovascular;;  . Amputation Right 06/06/2015    Procedure: AMPUTATION ABOVE KNEE;  Surgeon: Annice NeedyJason S Dew, MD;  Location: [facility] ORS;  Service: Vascular;  Laterality: Right;  . Above the knee amputation      right leg    Family History  Problem Relation Age of Onset  . Diabetes Mother   . Diabetes Father     Social History  Substance Use Topics  . Smoking status: Never Smoker   . Smokeless tobacco: Never Used  . Alcohol Use: No    Allergies  Allergen Reactions  . Dilaudid [Hydromorphone Hcl] Nausea And Vomiting  . Morphine And Related Other (See Comments)    Reaction:  Lightheadedness  Patient's daughter says she can take morphine.    Prior to Admission medications   Medication Sig Start Date End Date Taking? Authorizing Provider  acetaminophen (TYLENOL) 325 MG  tablet Take 2 tablets (650 mg total) by mouth every 6 (six) hours as needed for mild pain (or Fever >/= 101). 05/31/15  Yes Kimberly A Stegmayer, PA-C   allopurinol (ZYLOPRIM) 100 MG tablet Take 100 mg by mouth daily.    Yes Historical Provider, MD  ALPRAZolam (XANAX) 0.25 MG tablet Take 1 tablet (0.25 mg total) by mouth 3 (three) times daily. 06/10/15  Yes Danella Penton, MD  apixaban (ELIQUIS) 2.5 MG TABS tablet Take 1 tablet (2.5 mg total) by mouth 2 (two) times daily. 08/24/15  Yes Adrian Saran, MD  aspirin EC 81 MG tablet Take 81 mg by mouth daily.   Yes Historical Provider, MD  atorvastatin (LIPITOR) 20 MG tablet Take 20 mg by mouth at bedtime.   Yes Historical Provider, MD  azelastine (ASTELIN) 0.1 % nasal spray Place 2 sprays into both nostrils 2 (two) times daily. Use in each nostril as directed   Yes Historical Provider, MD  benzonatate (TESSALON) 100 MG capsule Take 100 mg by mouth 3 (three) times daily as needed for cough.   Yes Historical Provider, MD  diltiazem (CARDIZEM CD) 120 MG 24 hr capsule Take 1 capsule (120 mg total) by mouth daily. 08/24/15  Yes Adrian Saran, MD  docusate sodium (COLACE) 100 MG capsule Take 1 capsule (100 mg total) by mouth 2 (two) times daily. 05/31/15  Yes Kimberly A Stegmayer, PA-C  escitalopram (LEXAPRO) 5 MG tablet Take 1 tablet (5 mg total) by mouth daily. Patient taking differently: Take 10 mg by mouth daily.  05/31/15  Yes Kimberly A Stegmayer, PA-C  ferrous sulfate 325 (65 FE) MG tablet Take 325 mg by mouth 2 (two) times daily.   Yes Historical Provider, MD  fexofenadine (ALLEGRA) 60 MG tablet Take 60 mg by mouth 2 (two) times daily.   Yes Historical Provider, MD  furosemide (LASIX) 20 MG tablet 40 mg in am and 20 mg at 1400 08/24/15  Yes Sital Mody, MD  gabapentin (NEURONTIN) 300 MG capsule Take 300 mg by mouth 3 (three) times daily.   Yes Historical Provider, MD  insulin aspart (NOVOLOG) 100 UNIT/ML injection Inject 0-15 Units into the skin 3 (three) times daily with meals. 06/11/15  Yes Danella Penton, MD  insulin detemir (LEVEMIR) 100 UNIT/ML injection Inject 0.42 mLs (42 Units total) into the skin at  bedtime. Patient taking differently: Inject 57 Units into the skin daily.  06/10/15  Yes Danella Penton, MD  ipratropium-albuterol (DUONEB) 0.5-2.5 (3) MG/3ML SOLN Take 3 mLs by nebulization every 6 (six) hours as needed (for shortness of breath).   Yes Historical Provider, MD  isosorbide mononitrate (IMDUR) 60 MG 24 hr tablet Take 60 mg by mouth daily.   Yes Historical Provider, MD  magnesium oxide (MAG-OX) 400 MG tablet Take 400 mg by mouth daily.   Yes Historical Provider, MD  metoprolol tartrate (LOPRESSOR) 25 MG tablet Take 1 tablet (25 mg total) by mouth 3 (three) times daily. 06/10/15  Yes Danella Penton, MD  Multiple Vitamin (MULTIVITAMIN WITH MINERALS) TABS tablet Take 1 tablet by mouth daily.   Yes Historical Provider, MD  oxyCODONE-acetaminophen (PERCOCET/ROXICET) 5-325 MG per tablet One to two tabs every four hours as needed for pain Patient taking differently: Take 1-2 tablets by mouth every 4 (four) hours as needed for severe pain.  05/31/15  Yes Kimberly A Stegmayer, PA-C  pantoprazole (PROTONIX) 40 MG tablet Take 1 tablet (40 mg total) by mouth 2 (two) times daily. 06/10/15  Yes Loraine Leriche  Sherlene Shams, MD  polyethylene glycol (MIRALAX / GLYCOLAX) packet Take 17 g by mouth at bedtime.    Yes Historical Provider, MD  predniSONE (DELTASONE) 10 MG tablet Take 1 tablet (10 mg total) by mouth daily with breakfast. 08/24/15  Yes Sital Mody, MD  senna-docusate (SENOKOT-S) 8.6-50 MG tablet Take 1 tablet by mouth 2 (two) times daily.   Yes Historical Provider, MD  temazepam (RESTORIL) 15 MG capsule Take 1 capsule (15 mg total) by mouth at bedtime. 06/10/15  Yes Danella Penton, MD  tiotropium (SPIRIVA) 18 MCG inhalation capsule Place 1 capsule (18 mcg total) into inhaler and inhale daily. 08/24/15  Yes Adrian Saran, MD      Review of Systems  Constitutional: Positive for fatigue. Negative for appetite change.  HENT: Positive for congestion and voice change (hoarse). Negative for postnasal drip and sore  throat.   Eyes: Positive for visual disturbance (blurry vision). Negative for pain.  Respiratory: Positive for cough (better) and shortness of breath (with minimal exertion). Negative for chest tightness.   Cardiovascular: Positive for leg swelling (left leg). Negative for chest pain and palpitations.  Gastrointestinal: Negative for abdominal pain and abdominal distention.  Endocrine: Negative.   Genitourinary: Negative.   Musculoskeletal: Positive for neck stiffness. Negative for back pain and arthralgias.  Skin: Negative.   Allergic/Immunologic: Negative.   Neurological: Positive for weakness and light-headedness. Negative for dizziness and headaches.  Hematological: Negative for adenopathy. Bruises/bleeds easily.  Psychiatric/Behavioral: Negative for sleep disturbance (sleeping on 1 pillow with head of bed elevated with oxygen) and dysphoric mood. The patient is nervous/anxious (when short of breath).        Objective:   Physical Exam  Constitutional: She is oriented to person, place, and time. She appears well-developed and well-nourished.  HENT:  Head: Normocephalic and atraumatic.  Eyes: Conjunctivae are normal. Pupils are equal, round, and reactive to light.  Neck: Normal range of motion. Neck supple.  Cardiovascular: Normal rate.  An irregular rhythm present.  Pulmonary/Chest: Effort normal. She has wheezes in the right lower field, the left upper field and the left lower field. She has no rales.  Abdominal: Soft. She exhibits no distension. There is no tenderness.  Musculoskeletal: She exhibits edema (left lower leg). She exhibits no tenderness.       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  Neurological: She is alert and oriented to person, place, and time.  Skin: Skin is warm and dry.  Psychiatric: She has a normal mood and affect. Her behavior is normal. Thought content normal.  Nursing note and vitals reviewed.   BP 105/50 mmHg  Pulse  71  Resp 18  Ht  (1.499 m)  Wt 205 lb (92.987 kg)  BMI 41.38 kg/m2  SpO2 97%       Assessment & Plan:  1: Chronic heart failure with preserved ejection fraction- Patient presents with shortness of breath with minimal exertion, fatigue and edema in her left lower leg. She did arrive in a wheelchair but she also has a right AKA that was done September 2016. She says that she's getting [facility] she [facility]. Discussed the importance of calling for an overnight weight gain of >2 pounds or a weekly weight gain of >5 pounds. She does not add salt to her food but her daughter is concerned about her getting too much sodium in the foods that she eats. She says that she gets served bacon, sausage, grilled cheese  etc. Order written for patient to be on a 2000mg  sodium diet. She is currently receiving physical therapy.  2: HTN- Blood pressure looks good today. 3: Atrial fibrillation- Currently rate controlled and taking metoprolol, diltiazem and eliquis. Does bruise easily and has quite a few bruises on the dorsum on both hands.  4: COPD- Does wear her oxygen at 2L around the clock along with taking prednisone and using her inhalers. Slight wheezing heard today.   Return here in 1 month or sooner for any questions/problems before then.

## 2015-09-11 NOTE — Patient Instructions (Signed)
Continue weighing daily and call for an overnight weight gain of > 2 pounds or a weekly weight gain of >5 pounds. 

## 2015-09-29 ENCOUNTER — Ambulatory Visit
Admission: RE | Admit: 2015-09-29 | Discharge: 2015-09-29 | Disposition: A | Discharge status: Home / Self Care | Payer: Medicare Other | Source: Ambulatory Visit | Attending: Internal Medicine | Admitting: Internal Medicine

## 2015-09-29 ENCOUNTER — Other Ambulatory Visit: Payer: Self-pay | Admitting: Internal Medicine

## 2015-09-29 DIAGNOSIS — I517 Cardiomegaly: Secondary | ICD-10-CM | POA: Insufficient documentation

## 2015-09-29 DIAGNOSIS — R0602 Shortness of breath: Secondary | ICD-10-CM

## 2015-09-29 DIAGNOSIS — J90 Pleural effusion, not elsewhere classified: Secondary | ICD-10-CM

## 2015-10-09 ENCOUNTER — Ambulatory Visit: Payer: Medicare Other | Admitting: Family

## 2016-03-02 ENCOUNTER — Other Ambulatory Visit: Payer: Self-pay | Admitting: Vascular Surgery

## 2016-03-09 ENCOUNTER — Ambulatory Visit
Admission: RE | Admit: 2016-03-09 | Discharge: 2016-03-09 | Disposition: A | Discharge status: Nursing Home (Includes ICF) | Payer: Medicare Other | Source: Ambulatory Visit | Attending: Vascular Surgery | Admitting: Vascular Surgery

## 2016-03-09 ENCOUNTER — Encounter: Payer: Self-pay | Admitting: *Deleted

## 2016-03-09 ENCOUNTER — Encounter
Admission: RE | Disposition: A | Discharge status: Nursing Home (Includes ICF) | Payer: Self-pay | Source: Ambulatory Visit | Attending: Vascular Surgery

## 2016-03-09 ENCOUNTER — Other Ambulatory Visit
Admission: RE | Admit: 2016-03-09 | Discharge: 2016-03-09 | Disposition: A | Discharge status: Home / Self Care | Payer: Medicare Other | Source: Ambulatory Visit | Attending: Vascular Surgery | Admitting: Vascular Surgery

## 2016-03-09 DIAGNOSIS — E7800 Pure hypercholesterolemia, unspecified: Secondary | ICD-10-CM | POA: Diagnosis not present

## 2016-03-09 DIAGNOSIS — I110 Hypertensive heart disease with heart failure: Secondary | ICD-10-CM | POA: Insufficient documentation

## 2016-03-09 DIAGNOSIS — I509 Heart failure, unspecified: Secondary | ICD-10-CM | POA: Insufficient documentation

## 2016-03-09 DIAGNOSIS — Z823 Family history of stroke: Secondary | ICD-10-CM | POA: Diagnosis not present

## 2016-03-09 DIAGNOSIS — Z885 Allergy status to narcotic agent status: Secondary | ICD-10-CM | POA: Insufficient documentation

## 2016-03-09 DIAGNOSIS — M7989 Other specified soft tissue disorders: Secondary | ICD-10-CM | POA: Diagnosis not present

## 2016-03-09 DIAGNOSIS — J449 Chronic obstructive pulmonary disease, unspecified: Secondary | ICD-10-CM | POA: Insufficient documentation

## 2016-03-09 DIAGNOSIS — I739 Peripheral vascular disease, unspecified: Secondary | ICD-10-CM | POA: Diagnosis not present

## 2016-03-09 DIAGNOSIS — Z794 Long term (current) use of insulin: Secondary | ICD-10-CM | POA: Insufficient documentation

## 2016-03-09 DIAGNOSIS — I639 Cerebral infarction, unspecified: Secondary | ICD-10-CM | POA: Diagnosis not present

## 2016-03-09 DIAGNOSIS — Z8249 Family history of ischemic heart disease and other diseases of the circulatory system: Secondary | ICD-10-CM | POA: Insufficient documentation

## 2016-03-09 DIAGNOSIS — Z841 Family history of disorders of kidney and ureter: Secondary | ICD-10-CM | POA: Insufficient documentation

## 2016-03-09 DIAGNOSIS — Z6836 Body mass index (BMI) 36.0-36.9, adult: Secondary | ICD-10-CM | POA: Insufficient documentation

## 2016-03-09 DIAGNOSIS — Z79899 Other long term (current) drug therapy: Secondary | ICD-10-CM | POA: Insufficient documentation

## 2016-03-09 DIAGNOSIS — I872 Venous insufficiency (chronic) (peripheral): Secondary | ICD-10-CM | POA: Diagnosis not present

## 2016-03-09 DIAGNOSIS — Z833 Family history of diabetes mellitus: Secondary | ICD-10-CM | POA: Diagnosis not present

## 2016-03-09 DIAGNOSIS — I213 ST elevation (STEMI) myocardial infarction of unspecified site: Secondary | ICD-10-CM | POA: Diagnosis not present

## 2016-03-09 DIAGNOSIS — I70222 Atherosclerosis of native arteries of extremities with rest pain, left leg: Secondary | ICD-10-CM | POA: Diagnosis not present

## 2016-03-09 DIAGNOSIS — M79609 Pain in unspecified limb: Secondary | ICD-10-CM | POA: Diagnosis not present

## 2016-03-09 DIAGNOSIS — Z7982 Long term (current) use of aspirin: Secondary | ICD-10-CM | POA: Insufficient documentation

## 2016-03-09 DIAGNOSIS — E119 Type 2 diabetes mellitus without complications: Secondary | ICD-10-CM | POA: Insufficient documentation

## 2016-03-09 DIAGNOSIS — Z89619 Acquired absence of unspecified leg above knee: Secondary | ICD-10-CM | POA: Insufficient documentation

## 2016-03-09 HISTORY — PX: PERIPHERAL VASCULAR CATHETERIZATION: SHX172C

## 2016-03-09 LAB — CREATININE, SERUM
Creatinine, Ser: 1.86 mg/dL — ABNORMAL HIGH (ref 0.44–1.00)
GFR calc Af Amer: 27 mL/min — ABNORMAL LOW (ref >60)
GFR calc non Af Amer: 23 mL/min — ABNORMAL LOW (ref >60)

## 2016-03-09 LAB — GLUCOSE, CAPILLARY: GLUCOSE-CAPILLARY: 295 mg/dL — AB (ref 65–99)

## 2016-03-09 LAB — BUN: BUN: 65 mg/dL — AB (ref 6–20)

## 2016-03-09 SURGERY — LOWER EXTREMITY ANGIOGRAPHY
Anesthesia: Moderate Sedation | Site: Leg Lower | Laterality: Left

## 2016-03-09 MED ORDER — HEPARIN (PORCINE) IN NACL 2-0.9 UNIT/ML-% IJ SOLN
INTRAMUSCULAR | Status: DC | PRN
  Administered 2016-03-09: 11:00:00

## 2016-03-09 MED ORDER — LIDOCAINE HCL (PF) 1 % IJ SOLN
INTRAMUSCULAR | Status: AC
Start: 2016-03-09 — End: 2016-03-09
  Filled 2016-03-09: qty 30

## 2016-03-09 MED ORDER — DEXTROSE 5 % IV SOLN
1.5000 g | INTRAVENOUS | Status: AC
  Administered 2016-03-09: 1.5 g via INTRAVENOUS

## 2016-03-09 MED ORDER — ONDANSETRON HCL 4 MG/2ML IJ SOLN: 4.0000 mg | Freq: Four times a day (QID) | INTRAMUSCULAR | Status: DC | PRN

## 2016-03-09 MED ORDER — MIDAZOLAM HCL 5 MG/5ML IJ SOLN
INTRAMUSCULAR | Status: AC
  Filled 2016-03-09: qty 5

## 2016-03-09 MED ORDER — FENTANYL CITRATE (PF) 100 MCG/2ML IJ SOLN
INTRAMUSCULAR | Status: AC
  Filled 2016-03-09: qty 2

## 2016-03-09 MED ORDER — ACETAMINOPHEN 325 MG RE SUPP
325.0000 mg | RECTAL | Status: DC | PRN
  Filled 2016-03-09: qty 2

## 2016-03-09 MED ORDER — ALUM & MAG HYDROXIDE-SIMETH 200-200-20 MG/5ML PO SUSP: 15.0000 mL | ORAL | Status: DC | PRN

## 2016-03-09 MED ORDER — PANTOPRAZOLE SODIUM 40 MG PO TBEC: 40.0000 mg | DELAYED_RELEASE_TABLET | Freq: Every day | ORAL | Status: DC

## 2016-03-09 MED ORDER — LIDOCAINE HCL (PF) 1 % IJ SOLN
INTRAMUSCULAR | Status: DC | PRN
Start: 2016-03-09 — End: 2016-03-09
  Administered 2016-03-09: 20 mL via INTRADERMAL

## 2016-03-09 MED ORDER — METHYLPREDNISOLONE SODIUM SUCC 125 MG IJ SOLR: 125.0000 mg | INTRAMUSCULAR | Status: DC | PRN

## 2016-03-09 MED ORDER — ACETAMINOPHEN 325 MG PO TABS: 325.0000 mg | ORAL_TABLET | ORAL | Status: DC | PRN

## 2016-03-09 MED ORDER — FAMOTIDINE 20 MG PO TABS: 40.0000 mg | ORAL_TABLET | ORAL | Status: DC | PRN

## 2016-03-09 MED ORDER — HEPARIN SODIUM (PORCINE) 1000 UNIT/ML IJ SOLN
INTRAMUSCULAR | Status: AC
  Filled 2016-03-09: qty 1

## 2016-03-09 MED ORDER — SODIUM CHLORIDE 0.9 % IV SOLN
INTRAVENOUS | Status: DC
  Administered 2016-03-09: 09:00:00 via INTRAVENOUS

## 2016-03-09 MED ORDER — OXYCODONE HCL 5 MG PO TABS: 5.0000 mg | ORAL_TABLET | ORAL | Status: DC | PRN

## 2016-03-09 MED ORDER — CLOPIDOGREL BISULFATE 75 MG PO TABS
ORAL_TABLET | ORAL | Status: AC
  Administered 2016-03-09: 300 mg via ORAL
  Filled 2016-03-09: qty 4

## 2016-03-09 MED ORDER — MIDAZOLAM HCL 2 MG/2ML IJ SOLN
INTRAMUSCULAR | Status: DC | PRN
  Administered 2016-03-09 (×5): 1 mg via INTRAVENOUS

## 2016-03-09 MED ORDER — FENTANYL CITRATE (PF) 100 MCG/2ML IJ SOLN
INTRAMUSCULAR | Status: DC | PRN
  Administered 2016-03-09 (×2): 25 ug via INTRAVENOUS
  Administered 2016-03-09: 50 ug via INTRAVENOUS

## 2016-03-09 MED ORDER — CLOPIDOGREL BISULFATE 75 MG PO TABS
75.0000 mg | ORAL_TABLET | Freq: Every day | ORAL | Status: AC
Start: 2016-03-10 — End: ?

## 2016-03-09 MED ORDER — HEPARIN SODIUM (PORCINE) 1000 UNIT/ML IJ SOLN
INTRAMUSCULAR | Status: DC | PRN
  Administered 2016-03-09: 5000 [IU] via INTRAVENOUS

## 2016-03-09 MED ORDER — CLOPIDOGREL BISULFATE 75 MG PO TABS
300.0000 mg | ORAL_TABLET | ORAL | Status: AC
  Administered 2016-03-09: 300 mg via ORAL

## 2016-03-09 SURGICAL SUPPLY — 22 items
BALLN LUTONIX 4X150X130 (BALLOONS) ×4
BALLN LUTONIX 5X150X130 (BALLOONS) ×8
BALLN ULTRVRSE 3X150X150 (BALLOONS) ×4
BALLOON LUTONIX 4X150X130 (BALLOONS) ×2 IMPLANT
BALLOON LUTONIX 5X150X130 (BALLOONS) ×4 IMPLANT
BALLOON ULTRVRSE 3X150X150 (BALLOONS) ×2 IMPLANT
CATH CXI SUPP ANG 4FR 135 (MICROCATHETER) ×2 IMPLANT
CATH CXI SUPP ANG 4FR 135CM (MICROCATHETER) ×4
CATH PIG 70CM (CATHETERS) ×4 IMPLANT
DEVICE PRESTO INFLATION (MISCELLANEOUS) ×4 IMPLANT
DEVICE STARCLOSE SE CLOSURE (Vascular Products) ×4 IMPLANT
DEVICE TORQUE (MISCELLANEOUS) ×4 IMPLANT
GLIDEWIRE ANGLED SS 035X260CM (WIRE) ×4 IMPLANT
PACK ANGIOGRAPHY (CUSTOM PROCEDURE TRAY) ×4 IMPLANT
SET INTRO CAPELLA COAXIAL (SET/KITS/TRAYS/PACK) ×4 IMPLANT
SHEATH ANL2 6FRX45 HC (SHEATH) ×4 IMPLANT
SHEATH BRITE TIP 5FRX11 (SHEATH) ×4 IMPLANT
SHIELD RADPAD SCOOP 12X17 (MISCELLANEOUS) ×4 IMPLANT
SYR MEDRAD MARK V 150ML (SYRINGE) ×4 IMPLANT
TUBING CONTRAST HIGH PRESS 72 (TUBING) ×4 IMPLANT
WIRE G V18X300CM (WIRE) ×4 IMPLANT
WIRE J 3MM .035X145CM (WIRE) ×4 IMPLANT

## 2016-03-09 NOTE — Progress Notes (Signed)
[facility] and spoke to BrewsterJakesha. Informed her of patients follow up appt, post procedure care, plavix prescription to start tomorrow. Information also given to patients family.

## 2016-03-09 NOTE — Progress Notes (Signed)
Patient is alert and oriented. Reporting no pain. Groin site WNL. Pulses at baseline for patient. Tolerated PO intake before discharge. Reviewed discharge instructions with patient, son, and called report to nurse at facility. Discharged to home with son.

## 2016-03-09 NOTE — H&P (Signed)
[facility] History & Physical Update  The patient was interviewed and re-examined.  The patient's previous History and Physical has been reviewed and is unchanged.  There is no change in the plan of care. We plan to proceed with the scheduled procedure.  Muzammil Bruins, Latina CraverGregory G, MD  03/09/2016, 10:54 AM

## 2016-03-09 NOTE — Op Note (Signed)
[facility] Percutaneous Study/Intervention Procedural Note   Date of Surgery: 03/09/2016  Surgeon:  Katha Cabal, MD.  Pre-operative Diagnosis: Atherosclerotic occlusive disease bilateral lower extremities with rest pain left and right above-knee amputation  Post-operative diagnosis: Same  Procedure(s) Performed: 1. Introduction catheter into left lower extremity 3rd order catheter placement  2. Contrast injection left lower extremity for distal runoff   3. Percutaneous transluminal angioplasty to 5 mm left superficial femoral artery and popliteal with Lutonix             4.  Percutaneous transluminal angioplasty to 3 mm left peroneal artery 5. Star close closure right common femoral arteriotomy  Anesthesia: Conscious sedation was administered under my direct supervision. IV Versed plus fentanyl were utilized. Continuous ECG, pulse oximetry and blood pressure was monitored throughout the entire procedure. Versed and fentanyl were utilized.  Conscious sedation was for a total of 70 minutes.  Sheath: 6 [place] Ansell right common femoral artery  Contrast: 65 cc  Fluoroscopy Time: 9.4 minutes  Indications: Danella Sensing presents with worsening rest pain of the left lower extremity. She is status post right above-knee amputation secondary to non-reconstructable atherosclerotic occlusive disease. She is very concerned about her left leg and therefore wishes to undergo arterial reconstruction. The risks and benefits are reviewed all questions answered patient agrees to proceed.  Procedure: LEILANNI HALVORSON is a 82 y.o. y.o. female who was identified and appropriate procedural time out was performed. The patient was then placed supine on the table and prepped and draped in the usual sterile fashion.   Ultrasound was placed in the sterile sleeve and the right groin was evaluated the right common femoral  artery was echolucent and pulsatile indicating patency.  Image was recorded for the permanent record and under real-time visualization a microneedle was inserted into the common femoral artery microwire followed by a micro-sheath.  A J-wire was then advanced through the micro-sheath and a  5 [place] sheath was then inserted over a J-wire. J-wire was then advanced and a 5 French pigtail catheter was positioned at the level of T12. AP projection of the aorta was then obtained. Pigtail catheter was repositioned to above the bifurcation and a RAO view of the pelvis was obtained.  Subsequently a pigtail catheter with the stiff angle Glidewire was used to cross the aortic bifurcation the catheter wire were advanced down into the left distal external iliac artery. Oblique view of the femoral bifurcation was then obtained and subsequently the wire was reintroduced and the pigtail catheter negotiated into the SFA representing third order catheter placement. Distal runoff was then performed.  5000 units of heparin was then given and allowed to circulate and a 6 [place] Ansell sheath was advanced up and over the bifurcation and positioned in the femoral artery  KMP  catheter and stiff angle Glidewire were then negotiated down into the distal popliteal.  Distal runoff was then completed by hand injection through the catheter. The V 18 wire was then reintroduced through the catheter and negotiated into the peroneal.  A 3 x 15 Ultraverse balloon was used to angioplasty the perineal and tibioperoneal trunk area inflation was to 12 atm for 2 minutes. Follow-up imaging demonstrated an excellent result with less than 5% residual stenosis.  Next, a 4 x 15 Lutonix balloon was used to angioplasty the popliteal throughout its entire length. The balloon inflation was to 10 atm for 2 minutes. This balloon was then serially positioned more proximally and angioplasty  of the SFA was done to 10-12 atm for 2 minutes. Following this, a 5 x  15 Lutonix balloon was then used to angioplasty the SFA from near the origin distally. 2 5 x 15 balloons were required. Inflations were to 10 atm for 2 minutes. Follow-up imaging demonstrated an excellent result and therefore stenting was not indicated.. Distal runoff was then reassessed.  After review of these images the sheath is pulled into the right external iliac oblique of the common femoral is obtained and a Star close device deployed. There no immediate complications.   Findings: The abdominal aorta is opacified with a bolus injection contrast. Renal arteries are patent. The aorta itself has diffuse disease but no hemodynamically significant lesions. The common and external iliac arteries are widely patent bilaterally.  The left common femoral is widely patent as is the profunda femoris.  The SFA does indeed have a multiple significant stenosis throughout its entire length as well as the popliteal..  The distal popliteal demonstrates increasing disease and the trifurcation is heavily diseased with occlusion of the anterior tibial and posterior tibial. The peroneal demonstrates a subtotal occlusion in its proximal portion.    Following angioplasty in the peroneal this  tibial now is in-line flow and looks quite nice. Angioplasty of the SFA  And popliteal yields an excellent result with less than 10% residual stenosis.    Summary: Successful recanalization left lower cavity for limb salvage    Disposition: Patient was taken to the recovery room in stable condition having tolerated the procedure well.  Osama Coleson, Dolores Lory 03/09/2016,12:15 PM

## 2016-03-10 ENCOUNTER — Encounter: Payer: Self-pay | Admitting: Vascular Surgery

## 2016-06-04 ENCOUNTER — Encounter: Payer: Self-pay | Admitting: *Deleted

## 2016-06-15 ENCOUNTER — Ambulatory Visit (INDEPENDENT_AMBULATORY_CARE_PROVIDER_SITE_OTHER): Payer: Medicare Other | Admitting: Vascular Surgery

## 2016-06-15 DIAGNOSIS — M7989 Other specified soft tissue disorders: Secondary | ICD-10-CM | POA: Diagnosis not present

## 2016-06-15 NOTE — Progress Notes (Signed)
History of Present Illness  There is no documented history at this time  Assessments & Plan   Unspecified Diagnosis    Additional instructions  Subjective:  Patient presents with venous ulcer of the Left lower extremity.    Procedure:  3 layer unna wrap was placed Left lower extremity.   Plan:   Follow up in one week.

## 2016-06-22 ENCOUNTER — Encounter (INDEPENDENT_AMBULATORY_CARE_PROVIDER_SITE_OTHER): Payer: Self-pay | Admitting: Vascular Surgery

## 2016-06-22 ENCOUNTER — Ambulatory Visit (INDEPENDENT_AMBULATORY_CARE_PROVIDER_SITE_OTHER): Payer: Medicare Other | Admitting: Vascular Surgery

## 2016-06-22 VITALS — BP 107/69 | HR 85 | Resp 16

## 2016-06-22 DIAGNOSIS — M7989 Other specified soft tissue disorders: Secondary | ICD-10-CM

## 2016-06-22 NOTE — Progress Notes (Signed)
History of Present Illness  There is no documented history at this time  Assessments & Plan   There are no diagnoses linked to this encounter.    Additional instructions  Subjective:  Patient presents with venous ulcer of the Left lower extremity.    Procedure:  3 layer unna wrap was placed Left lower extremity.   Plan:   Follow up in one week.  

## 2016-06-29 ENCOUNTER — Ambulatory Visit (INDEPENDENT_AMBULATORY_CARE_PROVIDER_SITE_OTHER): Payer: Medicare Other | Admitting: Vascular Surgery

## 2016-06-29 ENCOUNTER — Encounter (INDEPENDENT_AMBULATORY_CARE_PROVIDER_SITE_OTHER): Payer: Self-pay

## 2016-06-29 VITALS — BP 135/83 | HR 78 | Resp 16

## 2016-06-29 DIAGNOSIS — M7989 Other specified soft tissue disorders: Secondary | ICD-10-CM

## 2016-06-29 NOTE — Progress Notes (Signed)
History of Present Illness  There is no documented history at this time  Assessments & Plan   There are no diagnoses linked to this encounter.    Additional instructions  Subjective:  Patient presents with venous ulcer of the Left lower extremity.    Procedure:  3 layer unna wrap was placed Left lower extremity.   Plan:   Follow up in one week.  

## 2016-07-05 ENCOUNTER — Encounter (INDEPENDENT_AMBULATORY_CARE_PROVIDER_SITE_OTHER): Payer: Medicare Other

## 2016-07-05 ENCOUNTER — Ambulatory Visit (INDEPENDENT_AMBULATORY_CARE_PROVIDER_SITE_OTHER): Payer: Medicare Other | Admitting: Vascular Surgery

## 2016-07-07 ENCOUNTER — Other Ambulatory Visit (INDEPENDENT_AMBULATORY_CARE_PROVIDER_SITE_OTHER): Payer: Self-pay | Admitting: Vascular Surgery

## 2016-07-07 DIAGNOSIS — M7989 Other specified soft tissue disorders: Secondary | ICD-10-CM

## 2016-07-08 ENCOUNTER — Ambulatory Visit (INDEPENDENT_AMBULATORY_CARE_PROVIDER_SITE_OTHER): Payer: Medicare Other | Admitting: Vascular Surgery

## 2016-07-08 ENCOUNTER — Ambulatory Visit (INDEPENDENT_AMBULATORY_CARE_PROVIDER_SITE_OTHER): Payer: Medicare Other

## 2016-07-08 DIAGNOSIS — M7989 Other specified soft tissue disorders: Secondary | ICD-10-CM

## 2016-07-13 ENCOUNTER — Telehealth (INDEPENDENT_AMBULATORY_CARE_PROVIDER_SITE_OTHER): Payer: Self-pay

## 2016-07-13 NOTE — Telephone Encounter (Signed)
Patient daughter [name](monica) called about the results for Ms Burtis Junesdavis ultrasound,so I related to her that I would let GS know to call her

## 2016-07-16 ENCOUNTER — Telehealth (INDEPENDENT_AMBULATORY_CARE_PROVIDER_SITE_OTHER): Payer: Self-pay | Admitting: Vascular Surgery

## 2016-07-16 NOTE — Telephone Encounter (Signed)
Called the patient's daughter and let her know that we will contact her and let her know the outcome of the patient's ultrasound and [employer] will contact her regarding the lymph pump when and/if she is approved through her insurance. I did let her know that I maybe the person calling her about the u/s. See note below.

## 2016-07-16 NOTE — Telephone Encounter (Signed)
Daughter stopped by today because she states she has not heard anything in the 8days since the US. She states that her mother is very concerned and would like to hear back. Would like to know if steps are being taken for the lymph pump and what the outcome of the venous US was.

## 2016-07-26 NOTE — Telephone Encounter (Signed)
I would plan to discuss this with the patient at follow up

## 2016-08-26 ENCOUNTER — Encounter (INDEPENDENT_AMBULATORY_CARE_PROVIDER_SITE_OTHER): Payer: Self-pay | Admitting: Vascular Surgery

## 2016-08-26 DIAGNOSIS — I890 Lymphedema, not elsewhere classified: Secondary | ICD-10-CM | POA: Insufficient documentation

## 2016-08-26 DIAGNOSIS — M7989 Other specified soft tissue disorders: Secondary | ICD-10-CM

## 2016-08-26 NOTE — Progress Notes (Signed)
The patient was last seen in the office on 06/08/2016 at which time her lymphedema was under por control and she had weeping wounds.  Unna wrap was placed.  Following this she was in the office on 07/08/2016 for a venus duplex which was negative for DVT no superficial reflux was identified.  Dr Lady GaryFath has cleared her for a lymph pump from a cardiology stand point.  At this point in time she has not had reasonable results from compression and elevation / conservative therapy.  Recommend:  No surgery or intervention at this point in time.    I have reviewed my previous discussion with the patient regarding swelling and why it causes symptoms.  Patient will continue wearing graduated compression stockings class 1 (20-30 mmHg) on a daily basis. The patient will  beginning wearing the stockings first thing in the morning and removing them in the evening. The patient is instructed specifically not to sleep in the stockings.    In addition, behavioral modification including several periods of elevation of the lower extremities during the day will be continued.  This was reviewed with the patient during the initial visit.  The patient will also continue routine exercise/physical therapy, especially walking as tolerated on a daily basis as was discussed during the initial visit.    Since graduated compression therapy and behavioral modification has not yielded adequate control of the lymphedema I believe that a lymph pump should be added to improve the control of the patient's lymphedema.  Additionally, a lymph pump is warranted because it will reduce the risk of cellulitis and ulceration in the future.  Patient should follow-up in six months

## 2016-08-27 ENCOUNTER — Telehealth (INDEPENDENT_AMBULATORY_CARE_PROVIDER_SITE_OTHER): Payer: Self-pay

## 2016-08-27 NOTE — Telephone Encounter (Signed)
I called the patient's daughter to let her know that the request for a lymphedema pump had been sent into [employer] and that if contacted by [employer] I would inform her of the contact. I did let her know that the patient's insurance would work with [employer] to get her approved for the pump.

## 2016-10-07 ENCOUNTER — Ambulatory Visit
Admission: RE | Admit: 2016-10-07 | Discharge: 2016-10-07 | Disposition: A | Discharge status: Home / Self Care | Payer: Medicare Other | Source: Ambulatory Visit | Attending: Internal Medicine | Admitting: Internal Medicine

## 2016-10-07 ENCOUNTER — Other Ambulatory Visit: Payer: Self-pay | Admitting: Internal Medicine

## 2016-10-07 DIAGNOSIS — I700 Atherosclerosis of aorta: Secondary | ICD-10-CM | POA: Insufficient documentation

## 2016-10-07 DIAGNOSIS — J449 Chronic obstructive pulmonary disease, unspecified: Secondary | ICD-10-CM

## 2016-11-15 ENCOUNTER — Encounter (INDEPENDENT_AMBULATORY_CARE_PROVIDER_SITE_OTHER): Payer: Self-pay | Admitting: Vascular Surgery

## 2016-11-15 ENCOUNTER — Ambulatory Visit (INDEPENDENT_AMBULATORY_CARE_PROVIDER_SITE_OTHER): Payer: Medicare Other | Admitting: Vascular Surgery

## 2016-11-15 VITALS — BP 114/72 | HR 79 | Resp 16 | Wt 200.0 lb

## 2016-11-15 DIAGNOSIS — J410 Simple chronic bronchitis: Secondary | ICD-10-CM | POA: Diagnosis not present

## 2016-11-15 DIAGNOSIS — M79605 Pain in left leg: Secondary | ICD-10-CM | POA: Diagnosis not present

## 2016-11-15 DIAGNOSIS — I70219 Atherosclerosis of native arteries of extremities with intermittent claudication, unspecified extremity: Secondary | ICD-10-CM | POA: Insufficient documentation

## 2016-11-15 DIAGNOSIS — N183 Chronic kidney disease, stage 3 unspecified: Secondary | ICD-10-CM

## 2016-11-15 DIAGNOSIS — I10 Essential (primary) hypertension: Secondary | ICD-10-CM

## 2016-11-15 DIAGNOSIS — I70212 Atherosclerosis of native arteries of extremities with intermittent claudication, left leg: Secondary | ICD-10-CM | POA: Diagnosis not present

## 2016-11-15 DIAGNOSIS — Z89611 Acquired absence of right leg above knee: Secondary | ICD-10-CM | POA: Diagnosis not present

## 2016-11-15 DIAGNOSIS — M79609 Pain in unspecified limb: Secondary | ICD-10-CM | POA: Insufficient documentation

## 2016-11-15 NOTE — Progress Notes (Signed)
MRN : [id number]  [name] is a 83 y.o. (1925-03-12) female who presents with chief complaint of  Chief Complaint  Patient presents with  . Re-evaluation    Left leg numbeness  .  History of Present Illness: The patient returns to the office for followup. There has been a significant deterioration in the left lower extremity symptoms.  The patient notes interval shortening of their claudication distance and development of rest pain symptoms. No new ulcers or wounds have occurred since the last visit.  There have been no significant changes to the patient's overall health care.  The patient denies amaurosis fugax or recent TIA symptoms. There are no recent neurological changes noted. The patient denies history of DVT, PE or superficial thrombophlebitis. The patient denies recent episodes of angina or shortness of breath.    Current Meds  Medication Sig  . acetaminophen (TYLENOL) 325 MG tablet Take 2 tablets (650 mg total) by mouth every 6 (six) hours as needed for mild pain (or Fever >/= 101).  Marland Kitchen allopurinol (ZYLOPRIM) 100 MG tablet Take 100 mg by mouth daily.   Marland Kitchen ALPRAZolam (XANAX) 0.25 MG tablet Take 1 tablet (0.25 mg total) by mouth 3 (three) times daily. (Patient taking differently: Take 0.25 mg by mouth 2 (two) times daily. )  . apixaban (ELIQUIS) 2.5 MG TABS tablet Take 1 tablet (2.5 mg total) by mouth 2 (two) times daily.  Marland Kitchen aspirin EC 81 MG tablet Take 81 mg by mouth daily.  Marland Kitchen atorvastatin (LIPITOR) 20 MG tablet Take 20 mg by mouth at bedtime.  . clopidogrel (PLAVIX) 75 MG tablet Take 1 tablet (75 mg total) by mouth daily.  Marland Kitchen Dextromethorphan-Guaifenesin (ROBITUSSIN DM) 10-100 MG/5ML liquid Take 15 mLs by mouth every 4 (four) hours as needed (cough).  Marland Kitchen diltiazem (CARDIZEM CD) 120 MG 24 hr capsule Take 1 capsule (120 mg total) by mouth daily.  Marland Kitchen docusate sodium (COLACE) 100 MG capsule Take 1 capsule (100 mg total) by mouth 2 (two) times daily.  Marland Kitchen escitalopram (LEXAPRO)  5 MG tablet Take 1 tablet (5 mg total) by mouth daily.  . ferrous sulfate 325 (65 FE) MG tablet Take 325 mg by mouth 2 (two) times daily.  . fexofenadine (ALLEGRA) 60 MG tablet Take 60 mg by mouth 2 (two) times daily.  . fluticasone (FLONASE) 50 MCG/ACT nasal spray Place 2 sprays into both nostrils daily.  . furosemide (LASIX) 20 MG tablet 40 mg in am and 20 mg at 1400 (Patient taking differently: Take 60 mg by mouth daily. )  . gabapentin (NEURONTIN) 300 MG capsule Take 600 mg by mouth at bedtime.   Marland Kitchen HYDROcodone-acetaminophen (NORCO/VICODIN) 5-325 MG tablet Take 1 tablet by mouth every 4 (four) hours as needed for moderate pain.  Marland Kitchen insulin glargine (LANTUS) 100 UNIT/ML injection Inject 50 Units into the skin at bedtime.  . insulin lispro (HUMALOG) 100 UNIT/ML injection Inject 2-15 Units into the skin 3 (three) times daily before meals. If 121-150=2 units, 151-200=3 units, 201-250=5 units, 251-300=8 units, 301-350=11 units, 351-400=15 units, 401 + call MD if BS is greater that 400  . isosorbide mononitrate (IMDUR) 60 MG 24 hr tablet Take 60 mg by mouth daily.  . magnesium oxide (MAG-OX) 400 MG tablet Take 400 mg by mouth daily.  . metolazone (ZAROXOLYN) 2.5 MG tablet Take 2.5 mg by mouth daily.  . metoprolol tartrate (LOPRESSOR) 25 MG tablet Take 1 tablet (25 mg total) by mouth 3 (three) times daily.  . Multiple Vitamin (  MULTIVITAMIN WITH MINERALS) TABS tablet Take 1 tablet by mouth daily.  Marland Kitchen oxyCODONE-acetaminophen (PERCOCET/ROXICET) 5-325 MG per tablet One to two tabs every four hours as needed for pain  . pantoprazole (PROTONIX) 40 MG tablet Take 1 tablet (40 mg total) by mouth 2 (two) times daily.  . polyethylene glycol (MIRALAX / GLYCOLAX) packet Take 17 g by mouth at bedtime.   . potassium chloride SA (K-DUR,KLOR-CON) 20 MEQ tablet Take 20 mEq by mouth 2 (two) times daily.  Marland Kitchen senna-docusate (SENOKOT-S) 8.6-50 MG tablet Take 1 tablet by mouth 2 (two) times daily.  Marland Kitchen tiotropium (SPIRIVA) 18  MCG inhalation capsule Place 1 capsule (18 mcg total) into inhaler and inhale daily.  Marland Kitchen triamcinolone cream (KENALOG) 0.1 % Apply 1 application topically 2 (two) times daily.    Past Medical History:  Diagnosis Date  . Atrial fibrillation (HCC)   . Blood transfusion without reported diagnosis   . Cancer (HCC)   . CHF (congestive heart failure) (HCC)   . Coronary artery disease   . Diabetes mellitus without complication (HCC)   . Hypertension   . Peripheral vascular disease (HCC)   . Shortness of breath dyspnea     Past Surgical History:  Procedure Laterality Date  . above the knee amputation     right leg  . AMPUTATION Right 06/06/2015   Procedure: AMPUTATION ABOVE KNEE;  Surgeon: Annice Needy, MD;  Location: [facility] ORS;  Service: Vascular;  Laterality: Right;  . BACK SURGERY    . CORONARY ARTERY BYPASS GRAFT    . PERIPHERAL VASCULAR CATHETERIZATION Right 05/27/2015   Procedure: Lower Extremity Angiography;  Surgeon: Renford Dills, MD;  Location: [facility];  Service: Cardiovascular;  Laterality: Right;  . PERIPHERAL VASCULAR CATHETERIZATION  05/27/2015   Procedure: Lower Extremity Intervention;  Surgeon: Renford Dills, MD;  Location: [facility];  Service: Cardiovascular;;  . PERIPHERAL VASCULAR CATHETERIZATION Left 03/09/2016   Procedure: Lower Extremity Angiography;  Surgeon: Renford Dills, MD;  Location: [facility];  Service: Cardiovascular;  Laterality: Left;  . PERIPHERAL VASCULAR CATHETERIZATION  03/09/2016   Procedure: Peripheral Vascular Intervention;  Surgeon: Renford Dills, MD;  Location: [facility];  Service: Cardiovascular;;  . VASCULAR SURGERY      Social History Social History  Substance Use Topics  . Smoking status: Never Smoker  . Smokeless tobacco: Never Used  . Alcohol use No    Family History Family History  Problem Relation Age of Onset  . Diabetes Mother   . Diabetes Father   No family history of  bleeding/clotting disorders, porphyria or autoimmune disease   Allergies  Allergen Reactions  . Dilaudid [Hydromorphone Hcl] Nausea And Vomiting  . Morphine And Related Other (See Comments)    Reaction:  Lightheadedness  Patient's daughter says she can take morphine.     REVIEW OF SYSTEMS (Negative unless checked)  Constitutional: [] Weight loss  [] Fever  [] Chills Cardiac: [] Chest pain   [] Chest pressure   [] Palpitations   [] Shortness of breath when laying flat   [] Shortness of breath with exertion. Vascular:  [] Pain in legs with walking   [x] Pain in legs at rest  [] History of DVT   [] Phlebitis   [] Swelling in legs   [] Varicose veins   [] Non-healing ulcers Pulmonary:   [] Uses home oxygen   [] Productive cough   [] Hemoptysis   [] Wheeze  [] COPD   [] Asthma Neurologic:  [] Dizziness   [] Seizures   [] History of stroke   [] History  of TIA  [] Aphasia   [] Vissual changes   [] Weakness or numbness in arm   [] Weakness or numbness in leg Musculoskeletal:   [] Joint swelling   [] Joint pain   [] Low back pain Hematologic:  [] Easy bruising  [] Easy bleeding   [] Hypercoagulable state   [] Anemic Gastrointestinal:  [] Diarrhea   [] Vomiting  [] Gastroesophageal reflux/heartburn   [] Difficulty swallowing. Genitourinary:  [] Chronic kidney disease   [] Difficult urination  [] Frequent urination   [] Blood in urine Skin:  [] Rashes   [] Ulcers  Psychological:  [] History of anxiety   []  History of major depression.  Physical Examination  Vitals:   11/15/16 0836  BP: 114/72  Pulse: 79  Resp: 16  Weight: 200 lb (90.7 kg)   Body mass index is 40.4 kg/m. Gen: WD/WN, NAD Head: Section/AT, No temporalis wasting.  Ear/Nose/Throat: Hearing grossly intact, nares w/o erythema or drainage, poor dentition Eyes: PER, EOMI, sclera nonicteric.  Neck: Supple, no masses.  No bruit or JVD.  Pulmonary:  Good air movement, clear to auscultation bilaterally, no use of accessory muscles.  Cardiac: RRR, normal S1, S2, no  Murmurs. Vascular: 3+ edema of the left leg, right AKA well healed Vessel Right Left  Radial Palpable Palpable  Ulnar Palpable Palpable  Brachial Palpable Palpable  Carotid Palpable Palpable  Femoral Palpable Palpable  Popliteal AKA Not Palpable  PT AKA Not Palpable  DP AKA Not Palpable   Gastrointestinal: soft, non-distended. No guarding/no peritoneal signs.  Musculoskeletal: M/S 5/5 upper but 3/5 lower.  presents in a wheelchair No deformity or atrophy.  Neurologic: CN 2-12 intact. Pain and light touch intact in extremities.  Symmetrical.  Speech is fluent. Motor exam as listed above. Psychiatric: Judgment intact, Mood & affect appropriate for pt's clinical situation. Dermatologic: No rashes or ulcers noted.  No changes consistent with cellulitis. Lymph : No Cervical lymphadenopathy, no lichenification or skin changes of chronic lymphedema.  CBC Lab Results  Component Value Date   WBC 6.2 08/24/2015   HGB 11.1 (L) 08/24/2015   HCT 34.1 (L) 08/24/2015   MCV 89.9 08/24/2015   PLT 131 (L) 08/24/2015    BMET    Component Value Date/Time   NA 141 08/24/2015 0433   K 4.4 08/24/2015 0433   CL 90 (L) 08/24/2015 0433   CO2 43 (H) 08/24/2015 0433   GLUCOSE 122 (H) 08/24/2015 0433   BUN 65 (H) 03/09/2016 0825   CREATININE 1.86 (H) 03/09/2016 0825   CALCIUM 8.7 (L) 08/24/2015 0433   GFRNONAA 23 (L) 03/09/2016 0825   GFRAA 27 (L) 03/09/2016 0825   CrCl cannot be calculated (Patient's most recent lab result is older than the maximum 21 days allowed.).  COAG Lab Results  Component Value Date   INR 1.24 06/05/2015   INR 1.09 05/26/2015    Radiology No results found.  Assessment/Plan 1. Pain of left lower extremity Recommend:  The patient has evidence of severe atherosclerotic changes of both lower extremities with rest pain that is associated with preulcerative changes and impending tissue loss of the foot.  This represents a limb threatening ischemia and places the  patient at the risk for limb loss.  Patient should undergo angiography of the lower extremities with the hope for intervention for limb salvage.  The risks and benefits as well as the alternative therapies was discussed in detail with the patient.  All questions were answered.  Patient agrees to proceed with angiography.  The patient will follow up with me in the office after the procedure.  2. Atherosclerosis of native artery of left lower extremity with intermittent claudication (HCC) See #1  3. History of above knee amputation, right (HCC) Continue with prosthesis as tolerated  4. Chronic kidney disease (CKD), stage III (moderate) minimize contrast  5. Simple chronic bronchitis (HCC) Continue pulmonary medications and aerosols as already ordered, these medications have been reviewed and there are no changes at this time.  6. Essential hypertension Continue antihypertensive medications as already ordered, these medications have been reviewed and there are no changes at this time.  Levora DredgeGregory Schnier, MD  11/15/2016 8:55 AM

## 2016-11-16 ENCOUNTER — Ambulatory Visit (INDEPENDENT_AMBULATORY_CARE_PROVIDER_SITE_OTHER): Payer: Medicare Other | Admitting: Vascular Surgery

## 2016-11-16 ENCOUNTER — Other Ambulatory Visit (INDEPENDENT_AMBULATORY_CARE_PROVIDER_SITE_OTHER): Payer: Self-pay | Admitting: Vascular Surgery

## 2016-11-16 ENCOUNTER — Ambulatory Visit (INDEPENDENT_AMBULATORY_CARE_PROVIDER_SITE_OTHER): Payer: Medicare Other

## 2016-11-16 ENCOUNTER — Encounter (INDEPENDENT_AMBULATORY_CARE_PROVIDER_SITE_OTHER): Payer: Self-pay | Admitting: Vascular Surgery

## 2016-11-16 VITALS — BP 116/62 | HR 86 | Resp 16

## 2016-11-16 DIAGNOSIS — E785 Hyperlipidemia, unspecified: Secondary | ICD-10-CM | POA: Diagnosis not present

## 2016-11-16 DIAGNOSIS — M79605 Pain in left leg: Secondary | ICD-10-CM

## 2016-11-16 DIAGNOSIS — E118 Type 2 diabetes mellitus with unspecified complications: Secondary | ICD-10-CM

## 2016-11-16 DIAGNOSIS — I70212 Atherosclerosis of native arteries of extremities with intermittent claudication, left leg: Secondary | ICD-10-CM | POA: Diagnosis not present

## 2016-11-16 DIAGNOSIS — I998 Other disorder of circulatory system: Secondary | ICD-10-CM

## 2016-11-18 ENCOUNTER — Encounter (INDEPENDENT_AMBULATORY_CARE_PROVIDER_SITE_OTHER): Payer: Self-pay

## 2016-11-18 ENCOUNTER — Other Ambulatory Visit (INDEPENDENT_AMBULATORY_CARE_PROVIDER_SITE_OTHER): Payer: Self-pay | Admitting: Vascular Surgery

## 2016-11-18 NOTE — Progress Notes (Signed)
Subjective:    Patient ID: [name], female    DOB: 1925/03/01, 83 y.o.   MRN: [id number] Chief Complaint  Patient presents with  . Follow-up   Patient presents with family to review vascular studies. The patient has been experiencing worsening left lower pain of the last few weeks. The patient endorses a history of what sounds like rest pain. The patent is unable to sleep in a laying position at night as she experiences left lower extremity pain. The patient has been sleeping in a sitting position in a chair. She denies any ulcer formation. She denies any fever, nausea or vomiting. The patient has a strong medical history of PAD with at least two left lower extremity angiograms and a right AKA. The patient underwent an ABI which showed Right ABI: AKA and left suggesting severe left lower extremity disease with PPG waveforms not detected (on 03/01/16, Right ABI: AKA and Left 1.3). A left lower extremity arterial duplex is notable for 75-99% stenosis of the left proximal SFA, >50% stenosis of the mid/distal SFA, occlusion of the PTA and ATA.    Review of Systems  Constitutional: Negative.   HENT: Negative.   Eyes: Negative.   Respiratory: Negative.   Cardiovascular: Negative.        Left Lower Extremity Pain  Gastrointestinal: Negative.   Endocrine: Negative.   Genitourinary: Negative.   Musculoskeletal: Negative.   Skin: Negative.   Allergic/Immunologic: Negative.   Neurological: Negative.   Hematological: Negative.   Psychiatric/Behavioral: Negative.       Objective:   Physical Exam Gen: WD/WN, NAD Head: [name]/AT, No temporalis wasting.  Ear/Nose/Throat: Hearing grossly intact, nares w/o erythema or drainage, poor dentition Eyes: PER, EOMI, sclera nonicteric.  Neck: Supple, no masses.  No bruit or JVD.  Pulmonary:  Good air movement, clear to auscultation bilaterally, no use of accessory muscles.  Cardiac: RRR, normal S1, S2, no Murmurs. Vascular: 3+ edema of the left leg,  right AKA well healed Vessel Right Left  Radial Palpable Palpable  Ulnar Palpable Palpable  Brachial Palpable Palpable  Carotid Palpable Palpable  Femoral Palpable Palpable  Popliteal AKA Not Palpable  PT AKA Not Palpable  DP AKA Not Palpable   Gastrointestinal: soft, non-distended. No guarding/no peritoneal signs.  Musculoskeletal: M/S 5/5 upper but 3/5 lower.  presents in a wheelchair No deformity or atrophy.  Neurologic: CN 2-12 intact. Pain and light touch intact in extremities.  Symmetrical.  Speech is fluent. Motor exam as listed above. Psychiatric: Judgment intact, Mood & affect appropriate for pt's clinical situation. Dermatologic: No rashes or ulcers noted.  No changes consistent with cellulitis. Lymph : No Cervical lymphadenopathy, no lichenification or skin changes of chronic lymphedema.  BP 116/62   Pulse 86   Resp 16   Past Medical History:  Diagnosis Date  . Atrial fibrillation (HCC)   . Blood transfusion without reported diagnosis   . Cancer (HCC)   . CHF (congestive heart failure) (HCC)   . Coronary artery disease   . Diabetes mellitus without complication (HCC)   . Hypertension   . Peripheral vascular disease (HCC)   . Shortness of breath dyspnea    Social History   Social History  . Marital status: Widowed    Spouse name: N/A  . Number of children: N/A  . Years of education: N/A   Occupational History  . Not on file.   Social History Main Topics  . Smoking status: Never Smoker  . Smokeless tobacco: Never Used  .  Alcohol use No  . Drug use: No  . Sexual activity: Not on file   Other Topics Concern  . Not on file   Social History Narrative  . No narrative on file   Past Surgical History:  Procedure Laterality Date  . above the knee amputation     right leg  . AMPUTATION Right 06/06/2015   Procedure: AMPUTATION ABOVE KNEE;  Surgeon: Annice Needy, MD;  Location: [facility] ORS;  Service: Vascular;  Laterality: Right;  . BACK SURGERY    .  CORONARY ARTERY BYPASS GRAFT    . PERIPHERAL VASCULAR CATHETERIZATION Right 05/27/2015   Procedure: Lower Extremity Angiography;  Surgeon: Renford Dills, MD;  Location: [facility];  Service: Cardiovascular;  Laterality: Right;  . PERIPHERAL VASCULAR CATHETERIZATION  05/27/2015   Procedure: Lower Extremity Intervention;  Surgeon: Renford Dills, MD;  Location: [facility];  Service: Cardiovascular;;  . PERIPHERAL VASCULAR CATHETERIZATION Left 03/09/2016   Procedure: Lower Extremity Angiography;  Surgeon: Renford Dills, MD;  Location: [facility];  Service: Cardiovascular;  Laterality: Left;  . PERIPHERAL VASCULAR CATHETERIZATION  03/09/2016   Procedure: Peripheral Vascular Intervention;  Surgeon: Renford Dills, MD;  Location: [facility];  Service: Cardiovascular;;  . VASCULAR SURGERY     Family History  Problem Relation Age of Onset  . Diabetes Mother   . Diabetes Father    Allergies  Allergen Reactions  . Dilaudid [Hydromorphone Hcl] Nausea And Vomiting  . Morphine And Related Other (See Comments)    Reaction:  Lightheadedness  Patient's daughter says she can take morphine.      Assessment & Plan:  Patient presents with family to review vascular studies. The patient has been experiencing worsening left lower pain of the last few weeks. The patient endorses a history of what sounds like rest pain. The patent is unable to sleep in a laying position at night as she experiences left lower extremity pain. The patient has been sleeping in a sitting position in a chair. She denies any ulcer formation. She denies any fever, nausea or vomiting. The patient has a strong medical history of PAD with at least two left lower extremity angiograms and a right AKA. The patient underwent an ABI which showed Right ABI: AKA and left suggesting severe left lower extremity disease with PPG waveforms not detected (on 03/01/16, Right ABI: AKA and Left 1.3). A left  lower extremity arterial duplex is notable for 75-99% stenosis of the left proximal SFA, >50% stenosis of the mid/distal SFA, occlusion of the PTA and ATA.   1. Type 2 diabetes mellitus with complication, unspecified long term insulin use status (HCC) - Stable Encouraged good control as its slows the progression of atherosclerotic disease  2. Hyperlipidemia, unspecified hyperlipidemia type - Stable Encouraged good control as its slows the progression of atherosclerotic disease  3. Ischemia of lower extremity - Worsening Patient presents with symptoms consistent with rest pain. PE has worsened since last visit. Pedal pulses are not present. No ulceration noted. ABI and duplex show progression of the patiens arterial disease when compared to the previous exam on 04/12/16. Recommend LLE angiogram with possible intervention in an attempt to revascularize the leg.  Procedure, risks and benefits explained to patient. All questions answered. Patient and family wish to proceed.  Current Outpatient Prescriptions on File Prior to Visit  Medication Sig Dispense Refill  . acetaminophen (TYLENOL) 325 MG tablet Take 2 tablets (650 mg total) by mouth  every 6 (six) hours as needed for mild pain (or Fever >/= 101).    Marland Kitchen allopurinol (ZYLOPRIM) 100 MG tablet Take 100 mg by mouth daily.     Marland Kitchen ALPRAZolam (XANAX) 0.25 MG tablet Take 1 tablet (0.25 mg total) by mouth 3 (three) times daily. (Patient taking differently: Take 0.25 mg by mouth 2 (two) times daily. ) 30 tablet 0  . apixaban (ELIQUIS) 2.5 MG TABS tablet Take 1 tablet (2.5 mg total) by mouth 2 (two) times daily. 60 tablet 0  . aspirin EC 81 MG tablet Take 81 mg by mouth daily.    Marland Kitchen atorvastatin (LIPITOR) 20 MG tablet Take 20 mg by mouth at bedtime.    . clopidogrel (PLAVIX) 75 MG tablet Take 1 tablet (75 mg total) by mouth daily. 30 tablet 4  . Dextromethorphan-Guaifenesin (ROBITUSSIN DM) 10-100 MG/5ML liquid Take 15 mLs by mouth every 4 (four) hours as  needed (cough).    Marland Kitchen diltiazem (CARDIZEM CD) 120 MG 24 hr capsule Take 1 capsule (120 mg total) by mouth daily. 30 capsule 0  . docusate sodium (COLACE) 100 MG capsule Take 1 capsule (100 mg total) by mouth 2 (two) times daily. 10 capsule 0  . escitalopram (LEXAPRO) 5 MG tablet Take 1 tablet (5 mg total) by mouth daily.    . ferrous sulfate 325 (65 FE) MG tablet Take 325 mg by mouth 2 (two) times daily.    . fexofenadine (ALLEGRA) 60 MG tablet Take 60 mg by mouth 2 (two) times daily.    . fluticasone (FLONASE) 50 MCG/ACT nasal spray Place 2 sprays into both nostrils daily.    . furosemide (LASIX) 20 MG tablet 40 mg in am and 20 mg at 1400 (Patient taking differently: Take 60 mg by mouth daily. ) 30 tablet 0  . gabapentin (NEURONTIN) 300 MG capsule Take 600 mg by mouth at bedtime.     Marland Kitchen HYDROcodone-acetaminophen (NORCO/VICODIN) 5-325 MG tablet Take 1 tablet by mouth every 4 (four) hours as needed for moderate pain.    Marland Kitchen insulin glargine (LANTUS) 100 UNIT/ML injection Inject 50 Units into the skin at bedtime.    . insulin lispro (HUMALOG) 100 UNIT/ML injection Inject 2-15 Units into the skin 3 (three) times daily before meals. If 121-150=2 units, 151-200=3 units, 201-250=5 units, 251-300=8 units, 301-350=11 units, 351-400=15 units, 401 + call MD if BS is greater that 400    . isosorbide mononitrate (IMDUR) 60 MG 24 hr tablet Take 60 mg by mouth daily.    . magnesium oxide (MAG-OX) 400 MG tablet Take 400 mg by mouth daily.    . metolazone (ZAROXOLYN) 2.5 MG tablet Take 2.5 mg by mouth daily.    . metoprolol tartrate (LOPRESSOR) 25 MG tablet Take 1 tablet (25 mg total) by mouth 3 (three) times daily. 1 tablet 1  . Multiple Vitamin (MULTIVITAMIN WITH MINERALS) TABS tablet Take 1 tablet by mouth daily.    Marland Kitchen oxyCODONE-acetaminophen (PERCOCET/ROXICET) 5-325 MG per tablet One to two tabs every four hours as needed for pain 30 tablet 0  . pantoprazole (PROTONIX) 40 MG tablet Take 1 tablet (40 mg total) by  mouth 2 (two) times daily. 1 tablet 1  . polyethylene glycol (MIRALAX / GLYCOLAX) packet Take 17 g by mouth at bedtime.     . potassium chloride SA (K-DUR,KLOR-CON) 20 MEQ tablet Take 20 mEq by mouth 2 (two) times daily.    Marland Kitchen senna-docusate (SENOKOT-S) 8.6-50 MG tablet Take 1 tablet by mouth 2 (two) times daily.    Marland Kitchen  tiotropium (SPIRIVA) 18 MCG inhalation capsule Place 1 capsule (18 mcg total) into inhaler and inhale daily. 30 capsule 12  . triamcinolone cream (KENALOG) 0.1 % Apply 1 application topically 2 (two) times daily.     No current facility-administered medications on file prior to visit.    There are no Patient Instructions on file for this visit. Return if symptoms worsen or fail to improve.  Johnye Kist A Layliana Devins, PA-C

## 2016-11-19 ENCOUNTER — Encounter (INDEPENDENT_AMBULATORY_CARE_PROVIDER_SITE_OTHER): Payer: Self-pay

## 2016-11-23 ENCOUNTER — Other Ambulatory Visit: Payer: Medicare Other

## 2016-11-23 MED ORDER — CEFAZOLIN IN D5W 1 GM/50ML IV SOLN
1.0000 g | Freq: Once | INTRAVENOUS | Status: AC
  Administered 2016-11-24: 1 g via INTRAVENOUS

## 2016-11-23 MED ORDER — FAMOTIDINE 20 MG PO TABS: 40.0000 mg | ORAL_TABLET | ORAL | Status: DC | PRN

## 2016-11-23 MED ORDER — METHYLPREDNISOLONE SODIUM SUCC 125 MG IJ SOLR: 125.0000 mg | INTRAMUSCULAR | Status: DC | PRN

## 2016-11-24 ENCOUNTER — Ambulatory Visit
Admission: RE | Admit: 2016-11-24 | Discharge: 2016-11-24 | Disposition: A | Discharge status: Home / Self Care | Payer: Medicare Other | Source: Ambulatory Visit | Attending: Vascular Surgery | Admitting: Vascular Surgery

## 2016-11-24 ENCOUNTER — Encounter
Admission: RE | Disposition: A | Discharge status: Home / Self Care | Payer: Self-pay | Source: Ambulatory Visit | Attending: Vascular Surgery

## 2016-11-24 DIAGNOSIS — Z7982 Long term (current) use of aspirin: Secondary | ICD-10-CM | POA: Insufficient documentation

## 2016-11-24 DIAGNOSIS — I110 Hypertensive heart disease with heart failure: Secondary | ICD-10-CM | POA: Insufficient documentation

## 2016-11-24 DIAGNOSIS — I70222 Atherosclerosis of native arteries of extremities with rest pain, left leg: Secondary | ICD-10-CM | POA: Insufficient documentation

## 2016-11-24 DIAGNOSIS — I2510 Atherosclerotic heart disease of native coronary artery without angina pectoris: Secondary | ICD-10-CM | POA: Insufficient documentation

## 2016-11-24 DIAGNOSIS — E785 Hyperlipidemia, unspecified: Secondary | ICD-10-CM | POA: Insufficient documentation

## 2016-11-24 DIAGNOSIS — Z7902 Long term (current) use of antithrombotics/antiplatelets: Secondary | ICD-10-CM | POA: Diagnosis not present

## 2016-11-24 DIAGNOSIS — Z951 Presence of aortocoronary bypass graft: Secondary | ICD-10-CM | POA: Diagnosis not present

## 2016-11-24 DIAGNOSIS — Z794 Long term (current) use of insulin: Secondary | ICD-10-CM | POA: Insufficient documentation

## 2016-11-24 DIAGNOSIS — I509 Heart failure, unspecified: Secondary | ICD-10-CM | POA: Insufficient documentation

## 2016-11-24 DIAGNOSIS — Z859 Personal history of malignant neoplasm, unspecified: Secondary | ICD-10-CM | POA: Insufficient documentation

## 2016-11-24 DIAGNOSIS — I4891 Unspecified atrial fibrillation: Secondary | ICD-10-CM | POA: Diagnosis not present

## 2016-11-24 DIAGNOSIS — Z89611 Acquired absence of right leg above knee: Secondary | ICD-10-CM | POA: Diagnosis not present

## 2016-11-24 DIAGNOSIS — E119 Type 2 diabetes mellitus without complications: Secondary | ICD-10-CM | POA: Diagnosis not present

## 2016-11-24 DIAGNOSIS — Z833 Family history of diabetes mellitus: Secondary | ICD-10-CM | POA: Insufficient documentation

## 2016-11-24 DIAGNOSIS — Z885 Allergy status to narcotic agent status: Secondary | ICD-10-CM | POA: Diagnosis not present

## 2016-11-24 HISTORY — PX: LOWER EXTREMITY INTERVENTION: CATH118252

## 2016-11-24 HISTORY — PX: LOWER EXTREMITY ANGIOGRAPHY: CATH118251

## 2016-11-24 LAB — BASIC METABOLIC PANEL
Anion gap: 10 (ref 5–15)
BUN: 60 mg/dL — AB (ref 6–20)
CO2: 36 mmol/L — AB (ref 22–32)
CREATININE: 1.83 mg/dL — AB (ref 0.44–1.00)
Calcium: 9.4 mg/dL (ref 8.9–10.3)
Chloride: 94 mmol/L — ABNORMAL LOW (ref 101–111)
GFR calc Af Amer: 27 mL/min — ABNORMAL LOW (ref >60)
GFR calc non Af Amer: 23 mL/min — ABNORMAL LOW (ref >60)
Glucose, Bld: 117 mg/dL — ABNORMAL HIGH (ref 65–99)
Potassium: 4.1 mmol/L (ref 3.5–5.1)
Sodium: 140 mmol/L (ref 135–145)

## 2016-11-24 SURGERY — LOWER EXTREMITY ANGIOGRAPHY
Anesthesia: Moderate Sedation | Laterality: Left

## 2016-11-24 MED ORDER — GUAIFENESIN-DM 100-10 MG/5ML PO SYRP: 15.0000 mL | ORAL_SOLUTION | ORAL | Status: DC | PRN

## 2016-11-24 MED ORDER — IPRATROPIUM-ALBUTEROL 0.5-2.5 (3) MG/3ML IN SOLN
RESPIRATORY_TRACT | Status: AC
  Filled 2016-11-24: qty 3

## 2016-11-24 MED ORDER — ALUM & MAG HYDROXIDE-SIMETH 200-200-20 MG/5ML PO SUSP: 15.0000 mL | ORAL | Status: DC | PRN

## 2016-11-24 MED ORDER — SODIUM CHLORIDE 0.9 % IV SOLN: INTRAVENOUS | Status: AC

## 2016-11-24 MED ORDER — SODIUM CHLORIDE 0.9 % IJ SOLN
INTRAMUSCULAR | Status: AC
  Filled 2016-11-24: qty 50

## 2016-11-24 MED ORDER — ACETAMINOPHEN 325 MG PO TABS: 325.0000 mg | ORAL_TABLET | ORAL | Status: DC | PRN

## 2016-11-24 MED ORDER — METOPROLOL TARTRATE 5 MG/5ML IV SOLN: 2.0000 mg | INTRAVENOUS | Status: DC | PRN

## 2016-11-24 MED ORDER — NITROGLYCERIN 1 MG/10 ML FOR IR/CATH LAB
INTRA_ARTERIAL | Status: DC | PRN
  Administered 2016-11-24: 500 ug

## 2016-11-24 MED ORDER — SODIUM CHLORIDE 0.9 % IV SOLN: 1.0000 mL/kg/h | INTRAVENOUS | Status: DC

## 2016-11-24 MED ORDER — HYDRALAZINE HCL 20 MG/ML IJ SOLN: 5.0000 mg | INTRAMUSCULAR | Status: DC | PRN

## 2016-11-24 MED ORDER — FENTANYL CITRATE (PF) 100 MCG/2ML IJ SOLN
INTRAMUSCULAR | Status: DC | PRN
  Administered 2016-11-24 (×2): 25 ug via INTRAVENOUS
  Administered 2016-11-24 (×2): 50 ug via INTRAVENOUS

## 2016-11-24 MED ORDER — HEPARIN SODIUM (PORCINE) 1000 UNIT/ML IJ SOLN
INTRAMUSCULAR | Status: AC
  Filled 2016-11-24: qty 1

## 2016-11-24 MED ORDER — FENTANYL CITRATE (PF) 100 MCG/2ML IJ SOLN
INTRAMUSCULAR | Status: AC
  Filled 2016-11-24: qty 2

## 2016-11-24 MED ORDER — IOPAMIDOL (ISOVUE-300) INJECTION 61%
INTRAVENOUS | Status: DC | PRN
  Administered 2016-11-24: 50 mL via INTRA_ARTERIAL

## 2016-11-24 MED ORDER — LABETALOL HCL 5 MG/ML IV SOLN: 10.0000 mg | INTRAVENOUS | Status: DC | PRN

## 2016-11-24 MED ORDER — OXYCODONE HCL 5 MG PO TABS
5.0000 mg | ORAL_TABLET | ORAL | Status: DC | PRN
  Filled 2016-11-24: qty 2

## 2016-11-24 MED ORDER — ONDANSETRON HCL 4 MG/2ML IJ SOLN
4.0000 mg | Freq: Four times a day (QID) | INTRAMUSCULAR | Status: DC | PRN
Start: 2016-11-24 — End: 2016-11-24

## 2016-11-24 MED ORDER — MORPHINE SULFATE (PF) 4 MG/ML IV SOLN: 2.0000 mg | INTRAVENOUS | Status: DC | PRN

## 2016-11-24 MED ORDER — ALBUTEROL SULFATE (2.5 MG/3ML) 0.083% IN NEBU
2.5000 mg | INHALATION_SOLUTION | Freq: Once | RESPIRATORY_TRACT | Status: AC
  Administered 2016-11-24: 2.5 mg via RESPIRATORY_TRACT
  Filled 2016-11-24: qty 3

## 2016-11-24 MED ORDER — HEPARIN SODIUM (PORCINE) 1000 UNIT/ML IJ SOLN
INTRAMUSCULAR | Status: DC | PRN
  Administered 2016-11-24: 5000 [IU] via INTRAVENOUS

## 2016-11-24 MED ORDER — LIDOCAINE HCL (PF) 1 % IJ SOLN
INTRAMUSCULAR | Status: AC
  Filled 2016-11-24: qty 10

## 2016-11-24 MED ORDER — SODIUM CHLORIDE 0.9 % IV SOLN: 500.0000 mL | Freq: Once | INTRAVENOUS | Status: DC | PRN

## 2016-11-24 MED ORDER — NITROGLYCERIN 5 MG/ML IV SOLN
INTRAVENOUS | Status: AC
  Filled 2016-11-24: qty 10

## 2016-11-24 MED ORDER — MIDAZOLAM HCL 5 MG/5ML IJ SOLN
INTRAMUSCULAR | Status: AC
  Filled 2016-11-24: qty 5

## 2016-11-24 MED ORDER — MIDAZOLAM HCL 2 MG/2ML IJ SOLN
INTRAMUSCULAR | Status: DC | PRN
  Administered 2016-11-24: 1 mg via INTRAVENOUS
  Administered 2016-11-24 (×2): 0.5 mg via INTRAVENOUS
  Administered 2016-11-24: 1 mg via INTRAVENOUS
  Administered 2016-11-24: 2 mg via INTRAVENOUS

## 2016-11-24 MED ORDER — ONDANSETRON HCL 4 MG/2ML IJ SOLN: 4.0000 mg | Freq: Four times a day (QID) | INTRAMUSCULAR | Status: DC | PRN

## 2016-11-24 MED ORDER — PHENOL 1.4 % MT LIQD: 1.0000 | OROMUCOSAL | Status: DC | PRN

## 2016-11-24 MED ORDER — APIXABAN 2.5 MG PO TABS
2.5000 mg | ORAL_TABLET | Freq: Once | ORAL | Status: AC
  Administered 2016-11-24: 2.5 mg via ORAL
  Filled 2016-11-24: qty 1

## 2016-11-24 MED ORDER — ACETAMINOPHEN 325 MG RE SUPP
325.0000 mg | RECTAL | Status: DC | PRN
  Filled 2016-11-24: qty 2

## 2016-11-24 MED ORDER — SODIUM CHLORIDE 0.9 % IV SOLN
INTRAVENOUS | Status: DC
  Administered 2016-11-24: 11:00:00 via INTRAVENOUS

## 2016-11-24 SURGICAL SUPPLY — 28 items
BALLN LUTONIX 5X150X130 (BALLOONS) ×8
BALLN LUTONIX DCB 4X100X130 (BALLOONS) ×4
BALLN LUTONIX DCB 4X40X130 (BALLOONS) ×4
BALLN LUTONIX DCB 5X80X130 (BALLOONS) ×4
BALLN ULTRVRSE 3X100X130C (BALLOONS) ×4
BALLOON LUTONIX 5X150X130 (BALLOONS) ×4 IMPLANT
BALLOON LUTONIX DCB 4X100X130 (BALLOONS) ×2 IMPLANT
BALLOON LUTONIX DCB 4X40X130 (BALLOONS) ×2 IMPLANT
BALLOON LUTONIX DCB 5X80X130 (BALLOONS) ×2 IMPLANT
BALLOON ULTRVRSE 3X100X130C (BALLOONS) ×2 IMPLANT
CANNULA 5F STIFF (CANNULA) ×4 IMPLANT
CATH PIG 70CM (CATHETERS) ×4 IMPLANT
CATH STS 5FR 125CM (CATHETERS) ×4 IMPLANT
COVER PROBE U/S 5X48 (MISCELLANEOUS) ×4 IMPLANT
DEVICE PRESTO INFLATION (MISCELLANEOUS) ×4 IMPLANT
DEVICE STARCLOSE SE CLOSURE (Vascular Products) ×4 IMPLANT
DEVICE TORQUE (MISCELLANEOUS) ×4 IMPLANT
GLIDEWIRE ANGLED SS 035X260CM (WIRE) ×4 IMPLANT
NEEDLE ENTRY 21GA 7CM ECHOTIP (NEEDLE) ×4 IMPLANT
PACK ANGIOGRAPHY (CUSTOM PROCEDURE TRAY) ×4 IMPLANT
SHEATH ANL2 6FRX45 HC (SHEATH) ×4 IMPLANT
SHEATH BRITE TIP 5FRX11 (SHEATH) ×4 IMPLANT
SHIELD RADPAD SCOOP 12X17 (MISCELLANEOUS) ×4 IMPLANT
SYR MEDRAD MARK V 150ML (SYRINGE) ×4 IMPLANT
TOWEL OR 17X26 4PK STRL BLUE (TOWEL DISPOSABLE) ×4 IMPLANT
TUBING CONTRAST HIGH PRESS 72 (TUBING) ×4 IMPLANT
WIRE J 3MM .035X145CM (WIRE) ×4 IMPLANT
WIRE MAGIC TORQUE 260C (WIRE) ×4 IMPLANT

## 2016-11-24 NOTE — Op Note (Signed)
[facility] History & Physical Update  The patient was interviewed and re-examined.  The patient's previous History and Physical has been reviewed and is unchanged.  There is no change in the plan of care. We plan to proceed with the scheduled procedure.  Levora DredgeGregory Tanishka Drolet, MD  11/24/2016, 12:16 PM

## 2016-11-24 NOTE — Op Note (Signed)
[facility] Percutaneous Study/Intervention Procedural Note   Date of Surgery: 11/24/2016  Surgeon: Hortencia Pilar  Pre-operative Diagnosis: Atherosclerotic occlusive disease bilateral lower extremities with rest pain of the left lower extremity  Post-operative diagnosis: Same  Procedure(s) Performed: 1. Introduction catheter into left lower extremity 3rd order catheter placement  2. Contrast injection left lower extremity for distal runoff  3. Percutaneous transluminal angioplasty with Lutonix drug-eluting balloons 5 mm in diameter to the superficial femoral and popliteal arteries 4. Percutaneous transluminal angioplasty of the left peroneal artery to 3 mm  5. Star close closure right common femoral arteriotomy  Anesthesia: Conscious sedation was administered under my direct supervision. IV Versed plus fentanyl were utilized. Continuous ECG, pulse oximetry and blood pressure was monitored throughout the entire procedure.  Conscious sedation was for a total of 1 hour 17 minutes.  Sheath: 6 [place] Ansell right common femoral artery  Contrast: 50 cc  Fluoroscopy Time: 8.9 minutes  Indications: [name] presents with increasing pain of the left lower extremity. She has a history of intervention in the past for similar indications and has done well from this previous treatment. Noninvasive studies demonstrated deterioration of her arterial circulation This suggests the patient is having limb threatening ischemia. The risks and benefits are reviewed all questions answered patient agrees to proceed.  Procedure:[name] is a 83 y.o. y.o. female who was identified and appropriate procedural time out was performed. The patient was then placed supine on the table and prepped and draped in the usual sterile fashion.   Ultrasound was placed in the sterile sleeve and the right groin was  evaluated the right common femoral artery was echolucent and pulsatile indicating patency. Image was recorded for the permanent record and under real-time visualization a microneedle was inserted into the common femoral artery microwire followed by a micro-sheath. A J-wire was then advanced through the micro-sheath and a 5 [place] sheath was then inserted over a J-wire. J-wire was then advanced and a 5 French pigtail catheter was positioned at the level of T12.  AP projection of the aorta was then obtained. Pigtail catheter was repositioned to above the bifurcation and a RAO view of the pelvis was obtained. Subsequently a pigtail catheter with the stiff angle Glidewire was used to cross the aortic bifurcation the catheter wire were advanced down into the left distal external iliac artery. Oblique view of the femoral bifurcation was then obtained and subsequently the wire was reintroduced and the pigtail catheter negotiated into the SFA representing third order catheter placement. Distal runoff was then performed.  5000 units of heparin was then given and allowed to circulate and a 6 [place] Ansell sheath was advanced up and over the bifurcation and positioned in the femoral artery  Straight catheter and stiff angle Glidewire were then negotiated down into the distal popliteal. Catheter was then advanced. Hand injection contrast demonstrated the tibial anatomy in detail.    A 3 mm x 10 cm all traverse balloon was then used to treat the proximal peroneal and the tibioperoneal trunk. Inflation was to 14 atm for 2 minutes. Follow-up imaging demonstrated an excellent result with less than 5% residual stenosis no evidence of dissection. Next the popliteal and SFA was addressed.  Multiple Lutonix balloons were used to angioplasty the popliteal and SFA across their entire length. Each inflation was for 2 minutes at 14 atm. the popliteal was angioplastied with a 4 x 10 cm Lutonix balloon and the SFA was dilated  with  5 mm Lutonix balloons. Follow-up imaging demonstrated good patency with preservation of the distal runoff.  Distal runoff was then reassessed and noted to be widely patent.   After review of these images the sheath is pulled into the right external iliac oblique of the common femoral is obtained and a Star close device deployed. There no immediate Complications.  Findings: The abdominal aorta is opacified with a bolus injection contrast. The aorta itself has diffuse disease but no hemodynamically significant lesions. The common and external iliac arteries are widely patent bilaterally.  The left common femoral is widely patent as is the profunda femoris.  The SFA does indeed have a significant stenosis throughout its entire length with several areas that are string signs this is also true within the popliteal.  The distal popliteal demonstrates increasing disease and the trifurcation is heavily diseased with occlusion of the anterior tibial and posterior tibial arteries at their origins and throughout the majority of their course. The peroneal demonstrates a subtotal occlusion in its proximal portion.    Following angioplasty peroneal is now widely patent with in-line flow and looks quite nice. Angioplasty of the SFA and the popliteal yields an excellent result with less than 25% residual stenosis.  Summary: Successful recanalization left lower extremity for limb salvage   Disposition: Patient was taken to the recovery room in stable condition having tolerated the procedure well.  [name] 11/24/2016,1:49 PM

## 2016-11-24 NOTE — Progress Notes (Signed)
Report called to Marisue IvanLiz at [facility].

## 2016-11-25 ENCOUNTER — Encounter: Payer: Self-pay | Admitting: Vascular Surgery

## 2016-12-17 ENCOUNTER — Other Ambulatory Visit (INDEPENDENT_AMBULATORY_CARE_PROVIDER_SITE_OTHER): Payer: Self-pay | Admitting: Vascular Surgery

## 2016-12-17 DIAGNOSIS — I739 Peripheral vascular disease, unspecified: Secondary | ICD-10-CM

## 2016-12-20 ENCOUNTER — Encounter (INDEPENDENT_AMBULATORY_CARE_PROVIDER_SITE_OTHER): Payer: Self-pay | Admitting: Vascular Surgery

## 2016-12-20 ENCOUNTER — Ambulatory Visit (INDEPENDENT_AMBULATORY_CARE_PROVIDER_SITE_OTHER): Payer: Medicare Other

## 2016-12-20 ENCOUNTER — Ambulatory Visit (INDEPENDENT_AMBULATORY_CARE_PROVIDER_SITE_OTHER): Payer: Medicare Other | Admitting: Vascular Surgery

## 2016-12-20 VITALS — BP 124/64 | HR 81 | Resp 16 | Ht <= 58 in | Wt 200.0 lb

## 2016-12-20 DIAGNOSIS — J410 Simple chronic bronchitis: Secondary | ICD-10-CM | POA: Diagnosis not present

## 2016-12-20 DIAGNOSIS — I2510 Atherosclerotic heart disease of native coronary artery without angina pectoris: Secondary | ICD-10-CM

## 2016-12-20 DIAGNOSIS — I70212 Atherosclerosis of native arteries of extremities with intermittent claudication, left leg: Secondary | ICD-10-CM | POA: Diagnosis not present

## 2016-12-20 DIAGNOSIS — I739 Peripheral vascular disease, unspecified: Secondary | ICD-10-CM | POA: Diagnosis not present

## 2016-12-20 DIAGNOSIS — E118 Type 2 diabetes mellitus with unspecified complications: Secondary | ICD-10-CM | POA: Diagnosis not present

## 2016-12-20 NOTE — Progress Notes (Signed)
MRN : [id number]  [name] is a 83 y.o. (December 13, 1924) female who presents with chief complaint of  Chief Complaint  Patient presents with  . Routine Post Op    3 weeks post op  .  History of Present Illness: The patient returns to the office for followup and review status post angiogram with intervention on 11/24/2016. The patient notes improvement in the lower extremity symptoms. Her left leg rest pain has completely resolved.  No new ulcers or wounds have occurred since the last visit.  She continues to c/o phantom pain in her AKA stump.  There have been no significant changes to the patient's overall health care.  The patient denies amaurosis fugax or recent TIA symptoms. There are no recent neurological changes noted. The patient denies history of DVT, PE or superficial thrombophlebitis. The patient denies recent episodes of angina or shortness of breath.   ABI's Rt=AKA and Lt=1.10  (previous ABI's Rt=AKA and Lt=0.35)   Current Meds  Medication Sig  . acetaminophen (TYLENOL) 325 MG tablet Take 2 tablets (650 mg total) by mouth every 6 (six) hours as needed for mild pain (or Fever >/= 101).  Marland Kitchen allopurinol (ZYLOPRIM) 100 MG tablet Take 100 mg by mouth daily.   Marland Kitchen ALPRAZolam (XANAX) 0.5 MG tablet Take 0.5 mg by mouth at bedtime.  Marland Kitchen apixaban (ELIQUIS) 2.5 MG TABS tablet Take 1 tablet (2.5 mg total) by mouth 2 (two) times daily.  Marland Kitchen aspirin EC 81 MG tablet Take 81 mg by mouth daily.  Marland Kitchen atorvastatin (LIPITOR) 20 MG tablet Take 20 mg by mouth every evening.   . benzonatate (TESSALON) 100 MG capsule Take 100 mg by mouth 3 (three) times daily as needed for cough.  . clopidogrel (PLAVIX) 75 MG tablet Take 1 tablet (75 mg total) by mouth daily.  Marland Kitchen Dextromethorphan-Guaifenesin (ROBITUSSIN DM) 10-100 MG/5ML liquid Take 15 mLs by mouth every 4 (four) hours as needed (cough).  Marland Kitchen diltiazem (CARDIZEM CD) 120 MG 24 hr capsule Take 1 capsule (120 mg total) by mouth daily. (Patient taking  differently: Take 120 mg by mouth every evening. )  . docusate sodium (COLACE) 100 MG capsule Take 1 capsule (100 mg total) by mouth 2 (two) times daily.  . ferrous sulfate 325 (65 FE) MG tablet Take 325 mg by mouth 2 (two) times daily.  . fluticasone (FLONASE) 50 MCG/ACT nasal spray Place 2 sprays into both nostrils daily.  . furosemide (LASIX) 40 MG tablet Take 40-80 mg by mouth See admin instructions. Pt to take 80 mg in the morning, and an additional 40 mg during the day  . gabapentin (NEURONTIN) 300 MG capsule Take 300-600 mg by mouth See admin instructions. Pt to take 300 mg twice daily as needed for pain management, and scheduled 600 mg at bedtime  . glipiZIDE (GLUCOTROL) 5 MG tablet Take 5 mg by mouth 2 (two) times daily before a meal.  . HYDROcodone-acetaminophen (NORCO/VICODIN) 5-325 MG tablet Take 1 tablet by mouth See admin instructions. Pt is scheduled to have 1 tablet 2 times daily, and 1 tablet every 4 hours as needed for pain. NOT TO EXCEED 3000 MG OF APAP DAILY FROM ALL SOURCES  . insulin glargine (LANTUS) 100 UNIT/ML injection Inject 62 Units into the skin at bedtime.   . insulin lispro (HUMALOG) 100 UNIT/ML injection Inject 2-15 Units into the skin 3 (three) times daily before meals. If 121-150=2 units, 151-200=3 units, 201-250=5 units, 251-300=8 units, 301-350=11 units, 351-400=15 units, 401 + call  MD if BS is greater that 400   . isosorbide mononitrate (IMDUR) 60 MG 24 hr tablet Take 60 mg by mouth daily.  . magnesium oxide (MAG-OX) 400 MG tablet Take 400 mg by mouth daily.  . metolazone (ZAROXOLYN) 2.5 MG tablet Take 2.5 mg by mouth daily.  . metoprolol tartrate (LOPRESSOR) 25 MG tablet Take 1 tablet (25 mg total) by mouth 3 (three) times daily.  . Multiple Vitamin (MULTIVITAMIN WITH MINERALS) TABS tablet Take 1 tablet by mouth daily.  . ondansetron (ZOFRAN) 4 MG tablet Take 4 mg by mouth every 6 (six) hours as needed for nausea or vomiting.  . ONE TOUCH ULTRA TEST test strip     . pantoprazole (PROTONIX) 40 MG tablet Take 1 tablet (40 mg total) by mouth 2 (two) times daily.  . polyethylene glycol (MIRALAX / GLYCOLAX) packet Take 17 g by mouth every evening.   . potassium chloride SA (K-DUR,KLOR-CON) 20 MEQ tablet Take 20 mEq by mouth 2 (two) times daily.  Marland Kitchen senna-docusate (SENOKOT-S) 8.6-50 MG tablet Take 1 tablet by mouth 2 (two) times daily.  Marland Kitchen spironolactone (ALDACTONE) 25 MG tablet Take 25 mg by mouth daily.    Past Medical History:  Diagnosis Date  . Atrial fibrillation (HCC)   . Blood transfusion without reported diagnosis   . Cancer (HCC)   . CHF (congestive heart failure) (HCC)   . Coronary artery disease   . Diabetes mellitus without complication (HCC)   . Hypertension   . Peripheral vascular disease (HCC)   . Shortness of breath dyspnea     Past Surgical History:  Procedure Laterality Date  . above the knee amputation     right leg  . AMPUTATION Right 06/06/2015   Procedure: AMPUTATION ABOVE KNEE;  Surgeon: Annice Needy, MD;  Location: [facility];  Service: Vascular;  Laterality: Right;  . BACK SURGERY    . CORONARY ARTERY BYPASS GRAFT    . LOWER EXTREMITY ANGIOGRAPHY Left 11/24/2016   Procedure: Lower Extremity Angiography;  Surgeon: Renford Dills, MD;  Location: [facility];  Service: Cardiovascular;  Laterality: Left;  . LOWER EXTREMITY INTERVENTION  11/24/2016   Procedure: Lower Extremity Intervention;  Surgeon: Renford Dills, MD;  Location: [facility];  Service: Cardiovascular;;  . PERIPHERAL VASCULAR CATHETERIZATION Right 05/27/2015   Procedure: Lower Extremity Angiography;  Surgeon: Renford Dills, MD;  Location: [facility];  Service: Cardiovascular;  Laterality: Right;  . PERIPHERAL VASCULAR CATHETERIZATION  05/27/2015   Procedure: Lower Extremity Intervention;  Surgeon: Renford Dills, MD;  Location: [facility];  Service: Cardiovascular;;  . PERIPHERAL VASCULAR CATHETERIZATION Left  03/09/2016   Procedure: Lower Extremity Angiography;  Surgeon: Renford Dills, MD;  Location: [facility];  Service: Cardiovascular;  Laterality: Left;  . PERIPHERAL VASCULAR CATHETERIZATION  03/09/2016   Procedure: Peripheral Vascular Intervention;  Surgeon: Renford Dills, MD;  Location: [facility];  Service: Cardiovascular;;  . VASCULAR SURGERY      Social History Social History  Substance Use Topics  . Smoking status: Never Smoker  . Smokeless tobacco: Never Used  . Alcohol use No    Family History Family History  Problem Relation Age of Onset  . Diabetes Mother   . Diabetes Father   No family history of bleeding/clotting disorders, porphyria or autoimmune disease   Allergies  Allergen Reactions  . Dilaudid [Hydromorphone Hcl] Nausea And Vomiting  . Morphine And Related Other (See Comments)  Reaction:  Lightheadedness  Patient's daughter says she can take morphine.  . Other Other (See Comments)    Per MAR from [employer] of  - Pt has No Known Allergies     REVIEW OF SYSTEMS (Negative unless checked)  Constitutional: Weight loss  Fever  Chills Cardiac: Chest pain   Chest pressure   Palpitations   Shortness of breath when laying flat   Shortness of breath with exertion. Vascular:  Pain in legs with walking   Pain in legs at rest  History of DVT   Phlebitis   Swelling in leg   Varicose veins   Non-healing ulcers Pulmonary:   Uses home oxygen   Productive cough   Hemoptysis   Wheeze  COPD   Asthma Neurologic:  Dizziness   Seizures   History of stroke   History of TIA  Aphasia   Vissual changes   Weakness or numbness in arm   Weakness or numbness in leg Musculoskeletal:   Joint swelling   Joint pain   Low back pain Hematologic:  Easy bruising  Easy bleeding   Hypercoagulable state   Anemic Gastrointestinal:  Diarrhea   Vomiting  Gastroesophageal  reflux/heartburn   Difficulty swallowing. Genitourinary:  Chronic kidney disease   Difficult urination  Frequent urination   Blood in urine Skin:  Rashes   Ulcers  Psychological:  History of anxiety    History of major depression.  Physical Examination  Vitals:   12/20/16 1147  BP: 124/64  Pulse: 81  Resp: 16  Weight: 90.7 kg (200 lb)  Height:  (1.473 m)   Body mass index is 41.8 kg/m. Gen: WD/WN, NAD Head: /AT, No temporalis wasting.  Ear/Nose/Throat: Hearing grossly intact, nares w/o erythema or drainage, poor dentition Eyes: PER, EOMI, sclera nonicteric.  Neck: Supple, no masses.  No bruit or JVD.  Pulmonary:  Good air movement, clear to auscultation bilaterally, no use of accessory muscles.  Cardiac: RRR, normal S1, S2, no Murmurs. Vascular: severe venous changes of the left leg; left foot is pink and warm with brisk cap refill.  2+ edema left leg Vessel Right Left  Radial Palpable Palpable  Ulnar Palpable Palpable  Brachial Palpable Palpable  Carotid Palpable Palpable  Femoral Palpable Palpable  Popliteal AKA Palpable  PT AKA Trace Palpable  DP AKA Not Palpable   Gastrointestinal: soft, non-distended. No guarding/no peritoneal signs.  Musculoskeletal: M/S 5/5 throughout.  No deformity or atrophy.  Neurologic: CN 2-12 intact. Pain and light touch intact in extremities.  Symmetrical.  Speech is fluent. Motor exam as listed above. Psychiatric: Judgment intact, Mood & affect appropriate for pt's clinical situation. Dermatologic: venous rashes left leg no ulcers noted.  No changes consistent with cellulitis. Lymph : No Cervical lymphadenopathy, no lichenification or skin changes of chronic lymphedema.  CBC Lab Results  Component Value Date   WBC 6.2 08/24/2015   HGB 11.1 (L) 08/24/2015   HCT 34.1 (L) 08/24/2015   MCV 89.9 08/24/2015   PLT 131 (L) 08/24/2015    BMET    Component Value Date/Time   NA 140 11/24/2016 1030   K 4.1  11/24/2016 1030   CL 94 (L) 11/24/2016 1030   CO2 36 (H) 11/24/2016 1030   GLUCOSE 117 (H) 11/24/2016 1030   BUN 60 (H) 11/24/2016 1030   CREATININE 1.83 (H) 11/24/2016 1030   CALCIUM 9.4 11/24/2016 1030   GFRNONAA 23 (L) 11/24/2016 1030   GFRAA 27 (L) 11/24/2016 1030   CrCl cannot be  calculated (Patient's most recent lab result is older than the maximum 21 days allowed.).  COAG Lab Results  Component Value Date   INR 1.24 06/05/2015   INR 1.09 05/26/2015    Radiology No results found.  Assessment/Plan 1. Atherosclerosis of native artery of left lower extremity with intermittent claudication (HCC) Recommend:  The patient is status post successful angiogram with intervention.  The patient reports that the claudication symptoms and leg pain is essentially gone.   The patient denies lifestyle limiting changes at this point in time.  No further invasive studies, angiography or surgery at this time The patient should continue walking and begin a more formal exercise program.  The patient should continue antiplatelet therapy and aggressive treatment of the lipid abnormalities  Smoking cessation was again discussed  The patient should continue wearing graduated compression socks 10-15 mmHg strength to control the mild edema.  Patient should undergo noninvasive studies as ordered. The patient will follow up with me after the studies.   - VAS [place] LOWER EXTREMITY ARTERIAL DUPLEX; Future - VAS [place] ABI WITH/WO TBI; Future  2. Arteriosclerosis of coronary artery Continue cardiac and antihypertensive medications as already ordered and reviewed, no changes at this time.  Continue statin as ordered and reviewed, no changes at this time  Nitrates PRN for chest pain  3. COPD Continue pulmonary medications and aerosols as already ordered, these medications have been reviewed and there are no changes at this time.    4. Type 2 diabetes mellitus with complication, unspecified long term  insulin use status (HCC) Continue hypoglycemic medications as already ordered, these medications have been reviewed and there are no changes at this time.  Hgb A1C to be monitored as already arranged by primary service     Levora Dredge, MD  12/20/2016 3:30 PM

## 2017-01-04 ENCOUNTER — Ambulatory Visit (INDEPENDENT_AMBULATORY_CARE_PROVIDER_SITE_OTHER): Payer: Medicare Other | Admitting: Vascular Surgery

## 2017-01-04 ENCOUNTER — Encounter (INDEPENDENT_AMBULATORY_CARE_PROVIDER_SITE_OTHER): Payer: Medicare Other

## 2017-02-10 ENCOUNTER — Encounter (INDEPENDENT_AMBULATORY_CARE_PROVIDER_SITE_OTHER): Payer: Self-pay | Admitting: Vascular Surgery

## 2017-02-10 ENCOUNTER — Ambulatory Visit (INDEPENDENT_AMBULATORY_CARE_PROVIDER_SITE_OTHER): Payer: Medicare Other | Admitting: Vascular Surgery

## 2017-02-10 VITALS — BP 129/69 | HR 91 | Resp 16 | Wt 200.0 lb

## 2017-02-10 DIAGNOSIS — E785 Hyperlipidemia, unspecified: Secondary | ICD-10-CM | POA: Diagnosis not present

## 2017-02-10 DIAGNOSIS — I739 Peripheral vascular disease, unspecified: Secondary | ICD-10-CM

## 2017-02-10 DIAGNOSIS — E118 Type 2 diabetes mellitus with unspecified complications: Secondary | ICD-10-CM | POA: Diagnosis not present

## 2017-02-10 DIAGNOSIS — M7989 Other specified soft tissue disorders: Secondary | ICD-10-CM | POA: Diagnosis not present

## 2017-02-10 DIAGNOSIS — M79605 Pain in left leg: Secondary | ICD-10-CM

## 2017-02-10 DIAGNOSIS — I890 Lymphedema, not elsewhere classified: Secondary | ICD-10-CM | POA: Diagnosis not present

## 2017-02-10 NOTE — Progress Notes (Signed)
MRN : [id number]  [name] is a 83 y.o. (29-Apr-1925) female who presents with chief complaint of  Chief Complaint  Patient presents with  . Leg Swelling  .  History of Present Illness: The patient returns to the office sooner than expected for followup evaluation regarding leg swelling and increased pain whixh worsened about 3 weeks ago and is coincident with a bronchitis.  The swelling has persisted and the pain associated with swelling continues. There have not been any interval development of a ulcerations or wounds.  Since the previous visit the patient has been wearing graduated compression stockings and has noted little if any improvement in the lymphedema.   The patient also states elevation during the day and exercise is being done too.  However, she was recently treated for a severe bronchitis and was not able to elevate her leg because of her difficulty breathing.  Current Meds  Medication Sig  . acetaminophen (TYLENOL) 325 MG tablet Take 2 tablets (650 mg total) by mouth every 6 (six) hours as needed for mild pain (or Fever >/= 101).  Marland Kitchen allopurinol (ZYLOPRIM) 100 MG tablet Take 100 mg by mouth daily.   Marland Kitchen ALPRAZolam (XANAX) 0.5 MG tablet Take 0.5 mg by mouth at bedtime.  Marland Kitchen apixaban (ELIQUIS) 2.5 MG TABS tablet Take 1 tablet (2.5 mg total) by mouth 2 (two) times daily.  Marland Kitchen aspirin EC 81 MG tablet Take 81 mg by mouth daily.  Marland Kitchen atorvastatin (LIPITOR) 20 MG tablet Take 20 mg by mouth every evening.   . benzonatate (TESSALON) 100 MG capsule Take 100 mg by mouth 3 (three) times daily as needed for cough.  . clopidogrel (PLAVIX) 75 MG tablet Take 1 tablet (75 mg total) by mouth daily.  Marland Kitchen Dextromethorphan-Guaifenesin (ROBITUSSIN DM) 10-100 MG/5ML liquid Take 15 mLs by mouth every 4 (four) hours as needed (cough).  Marland Kitchen diltiazem (CARDIZEM CD) 120 MG 24 hr capsule Take 1 capsule (120 mg total) by mouth daily. (Patient taking differently: Take 120 mg by mouth every evening. )  .  docusate sodium (COLACE) 100 MG capsule Take 1 capsule (100 mg total) by mouth 2 (two) times daily.  . ferrous sulfate 325 (65 FE) MG tablet Take 325 mg by mouth 2 (two) times daily.  . fluticasone (FLONASE) 50 MCG/ACT nasal spray Place 2 sprays into both nostrils daily.  . furosemide (LASIX) 40 MG tablet Take 40-80 mg by mouth See admin instructions. Pt to take 80 mg in the morning, and an additional 40 mg during the day  . gabapentin (NEURONTIN) 300 MG capsule Take 300-600 mg by mouth See admin instructions. Pt to take 300 mg twice daily as needed for pain management, and scheduled 600 mg at bedtime  . glipiZIDE (GLUCOTROL) 5 MG tablet Take 5 mg by mouth 2 (two) times daily before a meal.  . HYDROcodone-acetaminophen (NORCO/VICODIN) 5-325 MG tablet Take 1 tablet by mouth See admin instructions. Pt is scheduled to have 1 tablet 2 times daily, and 1 tablet every 4 hours as needed for pain. NOT TO EXCEED 3000 MG OF APAP DAILY FROM ALL SOURCES  . insulin glargine (LANTUS) 100 UNIT/ML injection Inject 62 Units into the skin at bedtime.   . insulin lispro (HUMALOG) 100 UNIT/ML injection Inject 2-15 Units into the skin 3 (three) times daily before meals. If 121-150=2 units, 151-200=3 units, 201-250=5 units, 251-300=8 units, 301-350=11 units, 351-400=15 units, 401 + call MD if BS is greater that 400   . isosorbide mononitrate (IMDUR)  60 MG 24 hr tablet Take 60 mg by mouth daily.  . magnesium oxide (MAG-OX) 400 MG tablet Take 400 mg by mouth daily.  . metolazone (ZAROXOLYN) 2.5 MG tablet Take 2.5 mg by mouth daily.  . metoprolol tartrate (LOPRESSOR) 25 MG tablet Take 1 tablet (25 mg total) by mouth 3 (three) times daily.  . Multiple Vitamin (MULTIVITAMIN WITH MINERALS) TABS tablet Take 1 tablet by mouth daily.  . ondansetron (ZOFRAN) 4 MG tablet Take 4 mg by mouth every 6 (six) hours as needed for nausea or vomiting.  . ONE TOUCH ULTRA TEST test strip   . pantoprazole (PROTONIX) 40 MG tablet Take 1 tablet  (40 mg total) by mouth 2 (two) times daily.  . polyethylene glycol (MIRALAX / GLYCOLAX) packet Take 17 g by mouth every evening.   . potassium chloride SA (K-DUR,KLOR-CON) 20 MEQ tablet Take 20 mEq by mouth 2 (two) times daily.  Marland Kitchen senna-docusate (SENOKOT-S) 8.6-50 MG tablet Take 1 tablet by mouth 2 (two) times daily.  Marland Kitchen spironolactone (ALDACTONE) 25 MG tablet Take 25 mg by mouth daily.    Past Medical History:  Diagnosis Date  . Atrial fibrillation (HCC)   . Blood transfusion without reported diagnosis   . Cancer (HCC)   . CHF (congestive heart failure) (HCC)   . Coronary artery disease   . Diabetes mellitus without complication (HCC)   . Hypertension   . Peripheral vascular disease (HCC)   . Shortness of breath dyspnea     Past Surgical History:  Procedure Laterality Date  . above the knee amputation     right leg  . AMPUTATION Right 06/06/2015   Procedure: AMPUTATION ABOVE KNEE;  Surgeon: Annice Needy, MD;  Location: [facility];  Service: Vascular;  Laterality: Right;  . BACK SURGERY    . CORONARY ARTERY BYPASS GRAFT    . LOWER EXTREMITY ANGIOGRAPHY Left 11/24/2016   Procedure: Lower Extremity Angiography;  Surgeon: Renford Dills, MD;  Location: [facility];  Service: Cardiovascular;  Laterality: Left;  . LOWER EXTREMITY INTERVENTION  11/24/2016   Procedure: Lower Extremity Intervention;  Surgeon: Renford Dills, MD;  Location: [facility];  Service: Cardiovascular;;  . PERIPHERAL VASCULAR CATHETERIZATION Right 05/27/2015   Procedure: Lower Extremity Angiography;  Surgeon: Renford Dills, MD;  Location: [facility];  Service: Cardiovascular;  Laterality: Right;  . PERIPHERAL VASCULAR CATHETERIZATION  05/27/2015   Procedure: Lower Extremity Intervention;  Surgeon: Renford Dills, MD;  Location: [facility];  Service: Cardiovascular;;  . PERIPHERAL VASCULAR CATHETERIZATION Left 03/09/2016   Procedure: Lower Extremity Angiography;  Surgeon:  Renford Dills, MD;  Location: [facility];  Service: Cardiovascular;  Laterality: Left;  . PERIPHERAL VASCULAR CATHETERIZATION  03/09/2016   Procedure: Peripheral Vascular Intervention;  Surgeon: Renford Dills, MD;  Location: [facility];  Service: Cardiovascular;;  . VASCULAR SURGERY      Social History Social History  Substance Use Topics  . Smoking status: Never Smoker  . Smokeless tobacco: Never Used  . Alcohol use No    Family History Family History  Problem Relation Age of Onset  . Diabetes Mother   . Diabetes Father     Allergies  Allergen Reactions  . Dilaudid [Hydromorphone Hcl] Nausea And Vomiting  . Morphine And Related Other (See Comments)    Reaction:  Lightheadedness  Patient's daughter says she can take morphine.  . Other Other (See Comments)    Per  MAR from [employer] [facility] [place] - Pt has No Known Allergies     REVIEW OF SYSTEMS (Negative unless checked)  Constitutional: [] Weight loss  [] Fever  [] Chills Cardiac: [] Chest pain   [] Chest pressure   [] Palpitations   [x] Shortness of breath when laying flat   [x] Shortness of breath with exertion. Vascular:  [] Pain in legs with walking   [x] Pain in legs at rest  [] History of DVT   [] Phlebitis   [x] Swelling in legs   [] Varicose veins   [] Non-healing ulcers Pulmonary:   [x] Uses home oxygen   [] Productive cough   [] Hemoptysis   [] Wheeze  [x] COPD   [x] Asthma Neurologic:  [] Dizziness   [] Seizures   [] History of stroke   [] History of TIA  [] Aphasia   [] Vissual changes   [] Weakness or numbness in arm   [] Weakness or numbness in leg Musculoskeletal:   [] Joint swelling   [] Joint pain   [] Low back pain Hematologic:  [] Easy bruising  [] Easy bleeding   [] Hypercoagulable state   [] Anemic Gastrointestinal:  [] Diarrhea   [] Vomiting  [] Gastroesophageal reflux/heartburn   [] Difficulty swallowing. Genitourinary:  [] Chronic kidney disease   [] Difficult urination  [] Frequent urination   [] Blood in  urine Skin:  [] Rashes   [] Ulcers  Psychological:  [] History of anxiety   []  History of major depression.  Physical Examination  Vitals:   02/10/17 1107  BP: 129/69  Pulse: 91  Resp: 16  Weight: 90.7 kg (200 lb)   Body mass index is 41.8 kg/m. Gen: WD/WN, NAD Head: [place]/AT, No temporalis wasting.  Ear/Nose/Throat: Hearing grossly intact, nares w/o erythema or drainage Eyes: PER, EOMI, sclera nonicteric.  Neck: Supple, no large masses.   Pulmonary:  Good air movement, no audible wheezing bilaterally, no use of accessory muscles.  Cardiac: RRR, no JVD Vascular:  4+ edema of the left leg toes remain pink with 1-2 sec cap refill no ulcers severe venous stasis changes.  Right AKA Vessel Right Left  Radial Palpable Palpable  Ulnar Palpable Palpable  Brachial Palpable Palpable  Carotid Palpable Palpable  Femoral Palpable Palpable  Popliteal AKA Not Palpable  PT AKA Not Palpable  DP AKA Not Palpable  Gastrointestinal: Non-distended. No guarding/no peritoneal signs.  Musculoskeletal: M/S 5/5 throughout.  Right AKA.  Neurologic: CN 2-12 intact. Symmetrical.  Speech is fluent. Motor exam as listed above. Psychiatric: Judgment intact, Mood & affect appropriate for pt's clinical situation. Dermatologic: No rashes or ulcers noted.  No changes consistent with cellulitis. Lymph : No lichenification or skin changes of chronic lymphedema.  CBC Lab Results  Component Value Date   WBC 6.2 08/24/2015   HGB 11.1 (L) 08/24/2015   HCT 34.1 (L) 08/24/2015   MCV 89.9 08/24/2015   PLT 131 (L) 08/24/2015    BMET    Component Value Date/Time   NA 140 11/24/2016 1030   K 4.1 11/24/2016 1030   CL 94 (L) 11/24/2016 1030   CO2 36 (H) 11/24/2016 1030   GLUCOSE 117 (H) 11/24/2016 1030   BUN 60 (H) 11/24/2016 1030   CREATININE 1.83 (H) 11/24/2016 1030   CALCIUM 9.4 11/24/2016 1030   GFRNONAA 23 (L) 11/24/2016 1030   GFRAA 27 (L) 11/24/2016 1030   CrCl cannot be calculated (Patient's most  recent lab result is older than the maximum 21 days allowed.).  COAG Lab Results  Component Value Date   INR 1.24 06/05/2015   INR 1.09 05/26/2015    Radiology No results found.  Assessment/Plan 1. Pain of left lower extremity Recommend:  Patient should undergo ABI's and venous duplex of the left lower extremity ASAP because there has been a significant deterioration in the patient's lower extremity symptoms.  The patient states they are having increased pain and a marked decrease in the distance that they can walk.  The risks and benefits as well as the alternatives were discussed in detail with the patient.  All questions were answered.  Patient agrees to proceed and understands this could be a prelude to angiography and intervention.  The patient will follow up with me in the office to review the studies.   - VAS US ABI WITH/WO TBI; Future - VAS US LOWER EXTREMITY VENOUS (DVT); Future  2. Swelling of limb See #1 will get venous duplex - VAS US LOWER EXTREMITY VENOUS (DVT); Future  3. Peripheral blood vessel disorder (HCC) See #1 with recheck ABI's  After the intervention I April she was 1.00  - VAS US ABI WITH/WO TBI; Future  4. Lymphedema Needs compression and lymph pump  5. Hyperlipidemia, unspecified hyperlipidemia type Continue statin as ordered and reviewed, no changes at this time   6. Type 2 diabetes mellitus with complication, unspecified whether long term insulin use (HCC) Continue hypoglycemic medications as already ordered, these medications have been reviewed and there are no changes at this time.  Hgb A1C to be monitored as already arranged by primary service   Levora DredgeGregory Schnier, MD  02/10/2017 11:17 AM

## 2017-02-11 ENCOUNTER — Other Ambulatory Visit (INDEPENDENT_AMBULATORY_CARE_PROVIDER_SITE_OTHER): Payer: Medicare Other

## 2017-02-11 DIAGNOSIS — M7989 Other specified soft tissue disorders: Secondary | ICD-10-CM | POA: Diagnosis not present

## 2017-02-11 DIAGNOSIS — I739 Peripheral vascular disease, unspecified: Secondary | ICD-10-CM

## 2017-02-11 DIAGNOSIS — M79605 Pain in left leg: Secondary | ICD-10-CM

## 2017-02-14 ENCOUNTER — Telehealth (INDEPENDENT_AMBULATORY_CARE_PROVIDER_SITE_OTHER): Payer: Self-pay | Admitting: Vascular Surgery

## 2017-02-14 NOTE — Telephone Encounter (Signed)
Spoke with patient's daughter. No DVT found in left lower extremity. ABI was conducted however unable to obtain reliable left ankle brachial index due to suprasystolic anchor ankle pressure which is most likely result of medial calcification. The left toe brachial index is normal. There is no change in left tibial artery waveforms when compared to previous exam. Patient has an appointment next month for an arterial duplex which her daughter will keep.

## 2017-02-24 ENCOUNTER — Ambulatory Visit (INDEPENDENT_AMBULATORY_CARE_PROVIDER_SITE_OTHER): Payer: Medicare Other | Admitting: Vascular Surgery

## 2017-02-24 ENCOUNTER — Encounter (INDEPENDENT_AMBULATORY_CARE_PROVIDER_SITE_OTHER): Payer: Self-pay

## 2017-03-25 ENCOUNTER — Other Ambulatory Visit (INDEPENDENT_AMBULATORY_CARE_PROVIDER_SITE_OTHER): Payer: Self-pay | Admitting: Vascular Surgery

## 2017-03-25 DIAGNOSIS — M79662 Pain in left lower leg: Secondary | ICD-10-CM

## 2017-03-25 DIAGNOSIS — M7989 Other specified soft tissue disorders: Principal | ICD-10-CM

## 2017-03-28 ENCOUNTER — Ambulatory Visit (INDEPENDENT_AMBULATORY_CARE_PROVIDER_SITE_OTHER): Payer: Medicare Other | Admitting: Vascular Surgery

## 2017-03-28 ENCOUNTER — Encounter (INDEPENDENT_AMBULATORY_CARE_PROVIDER_SITE_OTHER): Payer: Self-pay | Admitting: Vascular Surgery

## 2017-03-28 ENCOUNTER — Ambulatory Visit (INDEPENDENT_AMBULATORY_CARE_PROVIDER_SITE_OTHER): Payer: Medicare Other

## 2017-03-28 VITALS — BP 124/76 | HR 84 | Ht <= 58 in

## 2017-03-28 DIAGNOSIS — I70212 Atherosclerosis of native arteries of extremities with intermittent claudication, left leg: Secondary | ICD-10-CM

## 2017-03-28 DIAGNOSIS — E118 Type 2 diabetes mellitus with unspecified complications: Secondary | ICD-10-CM | POA: Diagnosis not present

## 2017-03-28 DIAGNOSIS — B372 Candidiasis of skin and nail: Secondary | ICD-10-CM

## 2017-03-28 DIAGNOSIS — M79605 Pain in left leg: Secondary | ICD-10-CM | POA: Diagnosis not present

## 2017-03-28 DIAGNOSIS — I890 Lymphedema, not elsewhere classified: Secondary | ICD-10-CM | POA: Diagnosis not present

## 2017-03-28 DIAGNOSIS — M7989 Other specified soft tissue disorders: Secondary | ICD-10-CM | POA: Diagnosis not present

## 2017-03-28 DIAGNOSIS — I10 Essential (primary) hypertension: Secondary | ICD-10-CM

## 2017-03-28 NOTE — Progress Notes (Signed)
MRN : [id number]  [name] is a 83 y.o. (20-Jun-1925) female who presents with chief complaint of  Chief Complaint  Patient presents with  . Follow-up  .  History of Present Illness: The patient returns to the office for followup and review status post angiogram with intervention.   Procedure(s) Performed 11/24/2016: 1. Introduction catheter into left lower extremity 3rd order catheter placement  2. Contrast injection left lower extremity for distal runoff  3. Percutaneous transluminal angioplasty with Lutonix drug-eluting balloons 5 mm in diameter to the superficial femoral and popliteal arteries 4. Percutaneous transluminal angioplasty of the left peroneal artery to 3 mm  5. Star close closure right common femoral arteriotomy  The patient notes improvement in the lower extremity symptoms. No interval shortening of the patient's claudication distance or rest pain symptoms. Previous wounds have now healed.  No new ulcers or wounds have occurred since the last visit.  There have been no significant changes to the patient's overall health care.  The patient denies amaurosis fugax or recent TIA symptoms. There are no recent neurological changes noted. The patient denies history of DVT, PE or superficial thrombophlebitis. The patient denies recent episodes of angina or shortness of breath.   ABI's Rt=AKA and Lt=1.04  (previous ABI's Rt=AKA and Lt=0.90) Duplex US of the left lower extremity arterial system shows patent SFA stent    Current Meds  Medication Sig  . acetaminophen (TYLENOL) 325 MG tablet Take 2 tablets (650 mg total) by mouth every 6 (six) hours as needed for mild pain (or Fever >/= 101).  Marland Kitchen. allopurinol (ZYLOPRIM) 100 MG tablet Take 100 mg by mouth daily.   Marland Kitchen. ALPRAZolam (XANAX) 0.5 MG tablet Take 0.5 mg by mouth at bedtime.  Marland Kitchen. apixaban (ELIQUIS) 2.5 MG TABS tablet Take 1 tablet (2.5 mg total) by mouth  2 (two) times daily.  Marland Kitchen. aspirin EC 81 MG tablet Take 81 mg by mouth daily.  Marland Kitchen. atorvastatin (LIPITOR) 20 MG tablet Take 20 mg by mouth every evening.   . benzonatate (TESSALON) 100 MG capsule Take 100 mg by mouth 3 (three) times daily as needed for cough.  . clopidogrel (PLAVIX) 75 MG tablet Take 1 tablet (75 mg total) by mouth daily.  Marland Kitchen. Dextromethorphan-Guaifenesin (ROBITUSSIN DM) 10-100 MG/5ML liquid Take 15 mLs by mouth every 4 (four) hours as needed (cough).  Marland Kitchen. diltiazem (CARDIZEM CD) 120 MG 24 hr capsule Take 1 capsule (120 mg total) by mouth daily. (Patient taking differently: Take 120 mg by mouth every evening. )  . docusate sodium (COLACE) 100 MG capsule Take 1 capsule (100 mg total) by mouth 2 (two) times daily.  . ferrous sulfate 325 (65 FE) MG tablet Take 325 mg by mouth 2 (two) times daily.  . fluticasone (FLONASE) 50 MCG/ACT nasal spray Place 2 sprays into both nostrils daily.  . furosemide (LASIX) 40 MG tablet Take 40-80 mg by mouth See admin instructions. Pt to take 80 mg in the morning, and an additional 40 mg during the day  . gabapentin (NEURONTIN) 300 MG capsule Take 300-600 mg by mouth See admin instructions. Pt to take 300 mg twice daily as needed for pain management, and scheduled 600 mg at bedtime  . glipiZIDE (GLUCOTROL) 5 MG tablet Take 5 mg by mouth 2 (two) times daily before a meal.  . HYDROcodone-acetaminophen (NORCO/VICODIN) 5-325 MG tablet Take 1 tablet by mouth See admin instructions. Pt is scheduled to have 1 tablet 2 times daily, and 1 tablet every 4  hours as needed for pain. NOT TO EXCEED 3000 MG OF APAP DAILY FROM ALL SOURCES  . insulin glargine (LANTUS) 100 UNIT/ML injection Inject 62 Units into the skin at bedtime.   . insulin lispro (HUMALOG) 100 UNIT/ML injection Inject 2-15 Units into the skin 3 (three) times daily before meals. If 121-150=2 units, 151-200=3 units, 201-250=5 units, 251-300=8 units, 301-350=11 units, 351-400=15 units, 401 + call MD if BS is  greater that 400   . isosorbide mononitrate (IMDUR) 60 MG 24 hr tablet Take 60 mg by mouth daily.  . magnesium oxide (MAG-OX) 400 MG tablet Take 400 mg by mouth daily.  . metolazone (ZAROXOLYN) 2.5 MG tablet Take 2.5 mg by mouth daily.  . metoprolol tartrate (LOPRESSOR) 25 MG tablet Take 1 tablet (25 mg total) by mouth 3 (three) times daily.  . Multiple Vitamin (MULTIVITAMIN WITH MINERALS) TABS tablet Take 1 tablet by mouth daily.  . ondansetron (ZOFRAN) 4 MG tablet Take 4 mg by mouth every 6 (six) hours as needed for nausea or vomiting.  . ONE TOUCH ULTRA TEST test strip   . pantoprazole (PROTONIX) 40 MG tablet Take 1 tablet (40 mg total) by mouth 2 (two) times daily.  . polyethylene glycol (MIRALAX / GLYCOLAX) packet Take 17 g by mouth every evening.   . potassium chloride SA (K-DUR,KLOR-CON) 20 MEQ tablet Take 20 mEq by mouth 2 (two) times daily.  Marland Kitchen senna-docusate (SENOKOT-S) 8.6-50 MG tablet Take 1 tablet by mouth 2 (two) times daily.  Marland Kitchen spironolactone (ALDACTONE) 25 MG tablet Take 25 mg by mouth daily.    Past Medical History:  Diagnosis Date  . Atrial fibrillation (HCC)   . Blood transfusion without reported diagnosis   . Cancer (HCC)   . CHF (congestive heart failure) (HCC)   . Coronary artery disease   . Diabetes mellitus without complication (HCC)   . Hypertension   . Peripheral vascular disease (HCC)   . Shortness of breath dyspnea     Past Surgical History:  Procedure Laterality Date  . above the knee amputation     right leg  . AMPUTATION Right 06/06/2015   Procedure: AMPUTATION ABOVE KNEE;  Surgeon: Annice Needy, MD;  Location: [facility];  Service: Vascular;  Laterality: Right;  . BACK SURGERY    . CORONARY ARTERY BYPASS GRAFT    . LOWER EXTREMITY ANGIOGRAPHY Left 11/24/2016   Procedure: Lower Extremity Angiography;  Surgeon: Renford Dills, MD;  Location: [facility];  Service: Cardiovascular;  Laterality: Left;  . LOWER EXTREMITY INTERVENTION  11/24/2016    Procedure: Lower Extremity Intervention;  Surgeon: Renford Dills, MD;  Location: [facility];  Service: Cardiovascular;;  . PERIPHERAL VASCULAR CATHETERIZATION Right 05/27/2015   Procedure: Lower Extremity Angiography;  Surgeon: Renford Dills, MD;  Location: [facility];  Service: Cardiovascular;  Laterality: Right;  . PERIPHERAL VASCULAR CATHETERIZATION  05/27/2015   Procedure: Lower Extremity Intervention;  Surgeon: Renford Dills, MD;  Location: [facility];  Service: Cardiovascular;;  . PERIPHERAL VASCULAR CATHETERIZATION Left 03/09/2016   Procedure: Lower Extremity Angiography;  Surgeon: Renford Dills, MD;  Location: [facility];  Service: Cardiovascular;  Laterality: Left;  . PERIPHERAL VASCULAR CATHETERIZATION  03/09/2016   Procedure: Peripheral Vascular Intervention;  Surgeon: Renford Dills, MD;  Location: [facility];  Service: Cardiovascular;;  . VASCULAR SURGERY      Social History Social History  Substance Use Topics  . Smoking status: Never  Smoker  . Smokeless tobacco: Never Used  . Alcohol use No    Family History Family History  Problem Relation Age of Onset  . Diabetes Mother   . Diabetes Father     Allergies  Allergen Reactions  . Dilaudid [Hydromorphone Hcl] Nausea And Vomiting  . Morphine And Related Other (See Comments)    Reaction:  Lightheadedness  Patient's daughter says she can take morphine.  . Other Other (See Comments)    Per MAR from [facility] [place] - Pt has No Known Allergies     REVIEW OF SYSTEMS (Negative unless checked)  Constitutional: [] Weight loss  [] Fever  [] Chills Cardiac: [] Chest pain   [] Chest pressure   [] Palpitations   [] Shortness of breath when laying flat   [] Shortness of breath with exertion. Vascular:  [] Pain in legs with walking   [] Pain in legs at rest  [] History of DVT   [] Phlebitis   [x] Swelling in legs   [] Varicose veins   [] Non-healing ulcers Pulmonary:    [] Uses home oxygen   [] Productive cough   [] Hemoptysis   [] Wheeze  [] COPD   [] Asthma Neurologic:  [] Dizziness   [] Seizures   [] History of stroke   [] History of TIA  [] Aphasia   [] Vissual changes   [] Weakness or numbness in arm   [] Weakness or numbness in leg Musculoskeletal:   [] Joint swelling   [] Joint pain   [] Low back pain Hematologic:  [] Easy bruising  [] Easy bleeding   [] Hypercoagulable state   [] Anemic Gastrointestinal:  [] Diarrhea   [] Vomiting  [] Gastroesophageal reflux/heartburn   [] Difficulty swallowing. Genitourinary:  [] Chronic kidney disease   [] Difficult urination  [] Frequent urination   [] Blood in urine Skin:  [] Rashes   [] Ulcers  Psychological:  [] History of anxiety   []  History of major depression.  Physical Examination  Vitals:   03/28/17 1434  BP: 124/76  Pulse: 84  Height: 4\' 10"  (1.473 m)   There is no height or weight on file to calculate BMI. Gen: WD/WN, NAD presents in a wheelchair Head: [place]/AT, No temporalis wasting.  Ear/Nose/Throat: Hearing grossly intact, nares w/o erythema or drainage Eyes: PER, EOMI, sclera nonicteric.  Neck: Supple, no large masses.   Pulmonary:  Good air movement, no audible wheezing bilaterally, no use of accessory muscles.  Cardiac: RRR, no JVD Vascular: severe venous stasis dermatitis left leg.  Brisk cap refill left foot is warm Vessel Right Left  Radial Palpable Palpable  Ulnar Palpable Palpable  Brachial Palpable Palpable  Carotid Palpable Palpable  Femoral Palpable Palpable  Popliteal AKA Not Palpable  PT AKA Not Palpable  DP AKA Not Palpable  Gastrointestinal: Non-distended. No guarding/no peritoneal signs.  Musculoskeletal: M/S 5/5 throughout upper extremities  4/5 left lower extremity.  No deformity or atrophy.  Neurologic: CN 2-12 intact. Symmetrical.  Speech is fluent. Motor exam as listed above. Psychiatric: Judgment intact, Mood & affect appropriate for pt's clinical situation. Dermatologic: Yeast noted underneath  both breasts in the creases no ulcers noted.  No changes consistent with cellulitis. Lymph : No lichenification or skin changes of chronic lymphedema.  CBC Lab Results  Component Value Date   WBC 6.2 08/24/2015   HGB 11.1 (L) 08/24/2015   HCT 34.1 (L) 08/24/2015   MCV 89.9 08/24/2015   PLT 131 (L) 08/24/2015    BMET    Component Value Date/Time   NA 140 11/24/2016 1030   K 4.1 11/24/2016 1030   CL 94 (L) 11/24/2016 1030   CO2 36 (H) 11/24/2016 1030  GLUCOSE 117 (H) 11/24/2016 1030   BUN 60 (H) 11/24/2016 1030   CREATININE 1.83 (H) 11/24/2016 1030   CALCIUM 9.4 11/24/2016 1030   GFRNONAA 23 (L) 11/24/2016 1030   GFRAA 27 (L) 11/24/2016 1030   CrCl cannot be calculated (Patient's most recent lab result is older than the maximum 21 days allowed.).  COAG Lab Results  Component Value Date   INR 1.24 06/05/2015   INR 1.09 05/26/2015    Radiology No results found.  Assessment/Plan 1. Atherosclerosis of native artery of left lower extremity with intermittent claudication (HCC)  Recommend:  The patient has evidence of atherosclerosis of the left lower extremity with claudication.  The patient does not voice lifestyle limiting changes at this point in time.  Noninvasive studies suggest clinically significant improvement.  No invasive studies, angiography or surgery at this time The patient should continue walking and begin a more formal exercise program.  The patient should continue antiplatelet therapy and aggressive treatment of the lipid abnormalities  No changes in the patient's medications at this time  The patient should continue wearing graduated compression socks 10-15 mmHg strength to control the mild edema.    A total of 30 minutes was spent with this patient and greater than 50% was spent in counseling and coordination of care with the patient. Both her vascular issues arterial and venous as well as her yeast infection were addressed.  Discussion included the  treatment options for vascular disease including indications for surgery and intervention.  Also discussed is the appropriate timing of treatment.  In addition medical therapy was discussed.  - VAS [place] LOWER EXTREMITY ARTERIAL DUPLEX; Future - VAS [place] ABI WITH/WO TBI; Future  2. Pain of left lower extremity Rest pain is resolved  3. Lymphedema Continue compression  4. Swelling of limb See #3  5. Type 2 diabetes mellitus with complication, unspecified whether long term insulin use (HCC) Continue hypoglycemic medications as already ordered, these medications have been reviewed and there are no changes at this time.  Hgb A1C to be monitored as already arranged by primary service   6. Essential hypertension Continue antihypertensive medications as already ordered, these medications have been reviewed and there are no changes at this time.   7. Yeast dermatitis Recommend using Desitin ointment for now until rash is healed then switching back to Nystatin powder   Levora Dredge, MD  03/28/2017 2:47 PM

## 2017-03-30 DIAGNOSIS — B372 Candidiasis of skin and nail: Secondary | ICD-10-CM | POA: Insufficient documentation

## 2017-04-11 ENCOUNTER — Emergency Department
Admission: EM | Admit: 2017-04-11 | Discharge: 2017-04-12 | Disposition: A | Discharge status: Home / Self Care | Payer: Medicare Other | Attending: Emergency Medicine | Admitting: Emergency Medicine

## 2017-04-11 ENCOUNTER — Emergency Department: Payer: Medicare Other

## 2017-04-11 ENCOUNTER — Encounter: Payer: Self-pay | Admitting: *Deleted

## 2017-04-11 DIAGNOSIS — R110 Nausea: Secondary | ICD-10-CM | POA: Insufficient documentation

## 2017-04-11 DIAGNOSIS — E1122 Type 2 diabetes mellitus with diabetic chronic kidney disease: Secondary | ICD-10-CM | POA: Diagnosis not present

## 2017-04-11 DIAGNOSIS — I70213 Atherosclerosis of native arteries of extremities with intermittent claudication, bilateral legs: Secondary | ICD-10-CM | POA: Diagnosis not present

## 2017-04-11 DIAGNOSIS — I5032 Chronic diastolic (congestive) heart failure: Secondary | ICD-10-CM | POA: Insufficient documentation

## 2017-04-11 DIAGNOSIS — Z89611 Acquired absence of right leg above knee: Secondary | ICD-10-CM | POA: Insufficient documentation

## 2017-04-11 DIAGNOSIS — J449 Chronic obstructive pulmonary disease, unspecified: Secondary | ICD-10-CM | POA: Insufficient documentation

## 2017-04-11 DIAGNOSIS — Z7984 Long term (current) use of oral hypoglycemic drugs: Secondary | ICD-10-CM | POA: Insufficient documentation

## 2017-04-11 DIAGNOSIS — Z79899 Other long term (current) drug therapy: Secondary | ICD-10-CM | POA: Insufficient documentation

## 2017-04-11 DIAGNOSIS — R232 Flushing: Secondary | ICD-10-CM | POA: Insufficient documentation

## 2017-04-11 DIAGNOSIS — N183 Chronic kidney disease, stage 3 (moderate): Secondary | ICD-10-CM | POA: Insufficient documentation

## 2017-04-11 DIAGNOSIS — Z794 Long term (current) use of insulin: Secondary | ICD-10-CM | POA: Diagnosis not present

## 2017-04-11 DIAGNOSIS — I130 Hypertensive heart and chronic kidney disease with heart failure and stage 1 through stage 4 chronic kidney disease, or unspecified chronic kidney disease: Secondary | ICD-10-CM | POA: Diagnosis not present

## 2017-04-11 DIAGNOSIS — N189 Chronic kidney disease, unspecified: Secondary | ICD-10-CM

## 2017-04-11 DIAGNOSIS — T7840XA Allergy, unspecified, initial encounter: Secondary | ICD-10-CM | POA: Insufficient documentation

## 2017-04-11 DIAGNOSIS — Z7982 Long term (current) use of aspirin: Secondary | ICD-10-CM | POA: Insufficient documentation

## 2017-04-11 LAB — CBC WITH DIFFERENTIAL/PLATELET
BASOS ABS: 0 10*3/uL (ref 0–0.1)
Basophils Relative: 0 %
Eosinophils Absolute: 0.1 10*3/uL (ref 0–0.7)
Eosinophils Relative: 2 %
HEMATOCRIT: 39.8 % (ref 35.0–47.0)
Hemoglobin: 13.4 g/dL (ref 12.0–16.0)
LYMPHS PCT: 17 %
Lymphs Abs: 1.2 10*3/uL (ref 1.0–3.6)
MCH: 30.3 pg (ref 26.0–34.0)
MCHC: 33.7 g/dL (ref 32.0–36.0)
MCV: 89.8 fL (ref 80.0–100.0)
MONO ABS: 0.7 10*3/uL (ref 0.2–0.9)
Monocytes Relative: 10 %
NEUTROS ABS: 5.1 10*3/uL (ref 1.4–6.5)
Neutrophils Relative %: 71 %
Platelets: 217 10*3/uL (ref 150–440)
RBC: 4.43 MIL/uL (ref 3.80–5.20)
RDW: 14.5 % (ref 11.5–14.5)
WBC: 7.1 10*3/uL (ref 3.6–11.0)

## 2017-04-11 LAB — COMPREHENSIVE METABOLIC PANEL
ALBUMIN: 3.8 g/dL (ref 3.5–5.0)
ALT: 12 U/L — ABNORMAL LOW (ref 14–54)
ANION GAP: 13 (ref 5–15)
AST: 23 U/L (ref 15–41)
Alkaline Phosphatase: 82 U/L (ref 38–126)
BILIRUBIN TOTAL: 0.6 mg/dL (ref 0.3–1.2)
BUN: 53 mg/dL — ABNORMAL HIGH (ref 6–20)
CO2: 33 mmol/L — AB (ref 22–32)
Calcium: 9.1 mg/dL (ref 8.9–10.3)
Chloride: 90 mmol/L — ABNORMAL LOW (ref 101–111)
Creatinine, Ser: 1.77 mg/dL — ABNORMAL HIGH (ref 0.44–1.00)
GFR calc Af Amer: 28 mL/min — ABNORMAL LOW (ref >60)
GFR calc non Af Amer: 24 mL/min — ABNORMAL LOW (ref >60)
GLUCOSE: 277 mg/dL — AB (ref 65–99)
POTASSIUM: 4.7 mmol/L (ref 3.5–5.1)
SODIUM: 136 mmol/L (ref 135–145)
TOTAL PROTEIN: 7.1 g/dL (ref 6.5–8.1)

## 2017-04-11 LAB — TROPONIN I: Troponin I: 0.05 ng/mL (ref <0.03)

## 2017-04-11 MED ORDER — PREDNISONE 20 MG PO TABS
40.0000 mg | ORAL_TABLET | Freq: Once | ORAL | Status: AC
  Administered 2017-04-11: 40 mg via ORAL
  Filled 2017-04-11: qty 2

## 2017-04-11 MED ORDER — ASPIRIN 81 MG PO CHEW
324.0000 mg | CHEWABLE_TABLET | Freq: Once | ORAL | Status: AC
  Administered 2017-04-11: 324 mg via ORAL
  Filled 2017-04-11: qty 4

## 2017-04-11 MED ORDER — DIPHENHYDRAMINE HCL 25 MG PO CAPS
25.0000 mg | ORAL_CAPSULE | Freq: Once | ORAL | Status: AC
  Administered 2017-04-11: 25 mg via ORAL
  Filled 2017-04-11: qty 1

## 2017-04-11 MED ORDER — METHYLPREDNISOLONE 4 MG PO TBPK: ORAL_TABLET | ORAL | 0 refills | Status: AC

## 2017-04-11 NOTE — Discharge Instructions (Signed)
You were evaluated for facial flushing and burning, and as we discussed I suspect this probably is due to allergic reaction from the penicillin. Please discontinue the penicillin. You may start the new antibiotic clindamycin as prescribed by your doctor. Next line for the allergic reaction, you may take over-the-counter Benadryl 25 mg every 4-6 hours as needed for itching or burning. You also being prescribed steroid Dosepak taper. Use as directed on labeling until completion.  Return to the emergency room immediately for any worsening symptoms including skin rash, trouble breathing, chest pain, dizziness or passing out, or any other symptoms concerning to you.

## 2017-04-11 NOTE — ED Provider Notes (Signed)
[facility] Emergency Department Provider Note ____________________________________________   I have reviewed the triage vital signs and the triage nursing note.  HISTORY  Chief Complaint Allergic Reaction   Historian Patient  HPI [name] is a 83 y.o. female came in by EMS, from assisted living, was diagnosed with abscess tooth and started on penicillin by mouth on Wednesday. On Thursday she started having some flushing, and on Friday and Saturday she felt like her face was red and some mild shortness of breath and occasional nausea. Her dose was decreased and her last dose of penicillin was on Sunday.  No fevers. Positive for nausea without vomiting. No diarrhea. No skin rash other than flushing of the face.  She wears oxygen only to sleep at night.  Patient states that her face is just so hard she needs a "rag. She just feels very uncomfortable which is why she was brought to the emergency department tonight.    Past Medical History:  Diagnosis Date  . Atrial fibrillation (HCC)   . Blood transfusion without reported diagnosis   . CHF (congestive heart failure) (HCC)   . Coronary artery disease   . Diabetes mellitus without complication (HCC)   . Hypertension   . Peripheral vascular disease (HCC)   . Shortness of breath dyspnea     Patient Active Problem List   Diagnosis Date Noted  . Yeast dermatitis 03/30/2017  . Pain in limb 11/15/2016  . Atherosclerosis of native arteries of extremity with intermittent claudication (HCC) 11/15/2016  . History of above knee amputation, right (HCC) 11/15/2016  . Lymphedema 08/26/2016  . Swelling of limb 06/15/2016  . Chronic diastolic heart failure (HCC) 09/11/2015  . Chronic obstructive pulmonary disease (COPD) (HCC) 09/11/2015  . Atrial fibrillation with RVR (HCC) 08/18/2015  . Ischemia of lower extremity 05/26/2015  . Aortic heart valve narrowing 05/26/2015  . Arthritis urica 05/26/2015  . H/O  angina pectoris 05/26/2015  . Peripheral blood vessel disorder (HCC) 05/26/2015  . Arteriosclerosis of coronary artery 01/25/2014  . Chronic kidney disease (CKD), stage III (moderate) 01/25/2014  . HLD (hyperlipidemia) 01/25/2014  . BP (high blood pressure) 01/25/2014  . Arthritis, degenerative 01/25/2014  . Hypertensive pulmonary vascular disease (HCC) 01/25/2014  . Diabetes mellitus, type 2 (HCC) 01/25/2014  . Herpes zona 07/14/2009    Past Surgical History:  Procedure Laterality Date  . above the knee amputation     right leg  . AMPUTATION Right 06/06/2015   Procedure: AMPUTATION ABOVE KNEE;  Surgeon: Annice Needy, MD;  Location: [facility] ORS;  Service: Vascular;  Laterality: Right;  . BACK SURGERY    . CORONARY ARTERY BYPASS GRAFT    . LOWER EXTREMITY ANGIOGRAPHY Left 11/24/2016   Procedure: Lower Extremity Angiography;  Surgeon: Renford Dills, MD;  Location: [facility] INVASIVE CV LAB;  Service: Cardiovascular;  Laterality: Left;  . LOWER EXTREMITY INTERVENTION  11/24/2016   Procedure: Lower Extremity Intervention;  Surgeon: Renford Dills, MD;  Location: [facility] INVASIVE CV LAB;  Service: Cardiovascular;;  . PERIPHERAL VASCULAR CATHETERIZATION Right 05/27/2015   Procedure: Lower Extremity Angiography;  Surgeon: Renford Dills, MD;  Location: [facility] INVASIVE CV LAB;  Service: Cardiovascular;  Laterality: Right;  . PERIPHERAL VASCULAR CATHETERIZATION  05/27/2015   Procedure: Lower Extremity Intervention;  Surgeon: Renford Dills, MD;  Location: [facility] INVASIVE CV LAB;  Service: Cardiovascular;;  . PERIPHERAL VASCULAR CATHETERIZATION Left 03/09/2016   Procedure: Lower Extremity Angiography;  Surgeon: Renford Dills, MD;  Location: [facility] INVASIVE  CV LAB;  Service: Cardiovascular;  Laterality: Left;  . PERIPHERAL VASCULAR CATHETERIZATION  03/09/2016   Procedure: Peripheral Vascular Intervention;  Surgeon: Renford Dills, MD;  Location: [facility] INVASIVE CV LAB;  Service: Cardiovascular;;  .  VASCULAR SURGERY      Prior to Admission medications   Medication Sig Start Date End Date Taking? Authorizing Provider  acetaminophen (TYLENOL) 325 MG tablet Take 2 tablets (650 mg total) by mouth every 6 (six) hours as needed for mild pain (or Fever >/= 101). 05/31/15   Stegmayer, Ranae Plumber, PA-C  allopurinol (ZYLOPRIM) 100 MG tablet Take 100 mg by mouth daily.     [provider]  ALPRAZolam Prudy Feeler) 0.5 MG tablet Take 0.5 mg by mouth at bedtime.    [provider]  apixaban (ELIQUIS) 2.5 MG TABS tablet Take 1 tablet (2.5 mg total) by mouth 2 (two) times daily. 08/24/15   Adrian Saran, MD  aspirin EC 81 MG tablet Take 81 mg by mouth daily.    [provider]  atorvastatin (LIPITOR) 20 MG tablet Take 20 mg by mouth every evening.     [provider]  benzonatate (TESSALON) 100 MG capsule Take 100 mg by mouth 3 (three) times daily as needed for cough.    [provider]  clopidogrel (PLAVIX) 75 MG tablet Take 1 tablet (75 mg total) by mouth daily. 03/10/16   Schnier, Latina Craver, MD  Dextromethorphan-Guaifenesin (ROBITUSSIN DM) 10-100 MG/5ML liquid Take 15 mLs by mouth every 4 (four) hours as needed (cough).    [provider]  diltiazem (CARDIZEM CD) 120 MG 24 hr capsule Take 1 capsule (120 mg total) by mouth daily. Patient taking differently: Take 120 mg by mouth every evening.  08/24/15   Adrian Saran, MD  docusate sodium (COLACE) 100 MG capsule Take 1 capsule (100 mg total) by mouth 2 (two) times daily. 05/31/15   Stegmayer, Cala Bradford A, PA-C  ferrous sulfate 325 (65 FE) MG tablet Take 325 mg by mouth 2 (two) times daily.    [provider]  fluticasone (FLONASE) 50 MCG/ACT nasal spray Place 2 sprays into both nostrils daily.    [provider]  furosemide (LASIX) 40 MG tablet Take 40-80 mg by mouth See admin instructions. Pt to take 80 mg in the morning, and an additional 40 mg during the day    [provider]   gabapentin (NEURONTIN) 300 MG capsule Take 300-600 mg by mouth See admin instructions. Pt to take 300 mg twice daily as needed for pain management, and scheduled 600 mg at bedtime    [provider]  glipiZIDE (GLUCOTROL) 5 MG tablet Take 5 mg by mouth 2 (two) times daily before a meal.    [provider]  HYDROcodone-acetaminophen (NORCO/VICODIN) 5-325 MG tablet Take 1 tablet by mouth See admin instructions. Pt is scheduled to have 1 tablet 2 times daily, and 1 tablet every 4 hours as needed for pain. NOT TO EXCEED 3000 MG OF APAP DAILY FROM ALL SOURCES    [provider]  insulin glargine (LANTUS) 100 UNIT/ML injection Inject 62 Units into the skin at bedtime.     [provider]  insulin lispro (HUMALOG) 100 UNIT/ML injection Inject 2-15 Units into the skin 3 (three) times daily before meals. If 121-150=2 units, 151-200=3 units, 201-250=5 units, 251-300=8 units, 301-350=11 units, 351-400=15 units, 401 + call MD if BS is greater that 400     [provider]  isosorbide mononitrate (IMDUR) 60 MG 24  hr tablet Take 60 mg by mouth daily.    [provider]  magnesium oxide (MAG-OX) 400 MG tablet Take 400 mg by mouth daily.    [provider]  methylPREDNISolone (MEDROL DOSEPAK) 4 MG TBPK tablet Take as directed on label. 04/11/17   Governor RooksLord, Prarthana Parlin, MD  metolazone (ZAROXOLYN) 2.5 MG tablet Take 2.5 mg by mouth daily.    [provider]  metoprolol tartrate (LOPRESSOR) 25 MG tablet Take 1 tablet (25 mg total) by mouth 3 (three) times daily. 06/10/15   Danella PentonMiller, Mark F, MD  Multiple Vitamin (MULTIVITAMIN WITH MINERALS) TABS tablet Take 1 tablet by mouth daily.    [provider]  ondansetron (ZOFRAN) 4 MG tablet Take 4 mg by mouth every 6 (six) hours as needed for nausea or vomiting.    [provider]  ONE TOUCH ULTRA TEST test strip  10/11/16   [provider]  pantoprazole (PROTONIX) 40 MG tablet Take 1 tablet  (40 mg total) by mouth 2 (two) times daily. 06/10/15   Danella PentonMiller, Mark F, MD  polyethylene glycol [facility](MIRALAX / Ethelene HalGLYCOLAX) packet Take 17 g by mouth every evening.     [provider]  potassium chloride SA (K-DUR,KLOR-CON) 20 MEQ tablet Take 20 mEq by mouth 2 (two) times daily.    [provider]  senna-docusate (SENOKOT-S) 8.6-50 MG tablet Take 1 tablet by mouth 2 (two) times daily.    [provider]  spironolactone (ALDACTONE) 25 MG tablet Take 25 mg by mouth daily.    [provider]    Allergies  Allergen Reactions  . Dilaudid [Hydromorphone Hcl] Nausea And Vomiting  . Morphine And Related Other (See Comments)    Reaction:  Lightheadedness  Patient's daughter says she can take morphine.  . Other Other (See Comments)    [facility] MAR from [facility] [place] - Pt has No Known Allergies  . Penicillins Other (See Comments)    Nausea and flushed face    Family History  Problem Relation Age of Onset  . Diabetes Mother   . Diabetes Father     Social History Social History  Substance Use Topics  . Smoking status: Never Smoker  . Smokeless tobacco: Never Used  . Alcohol use No    Review of Systems  Constitutional: Negative for fever. Eyes: Negative for visual changes. ENT: Negative for sore throat. Cardiovascular: Negative for chest pain. Respiratory: Positive for mild shortness of breath. Gastrointestinal: Negative for abdominal pain, vomiting and diarrhea. Genitourinary: Negative for dysuria. Musculoskeletal: Negative for back pain. Skin: Negative for rash. Neurological: Negative for headache.  ____________________________________________   PHYSICAL EXAM:  VITAL SIGNS: ED Triage Vitals  Enc Vitals Group     BP 04/11/17 2126 (!) 167/82     Pulse Rate 04/11/17 2126 82     Resp 04/11/17 2126 18     Temp 04/11/17 2126 97.8 F (36.6 C)     Temp Source 04/11/17 2126 Oral     SpO2 04/11/17 2126 96 %     Weight 04/11/17 2128 200 lb  (90.7 kg)     Height 04/11/17 2128 4\' 10"  (1.473 m)     Head Circumference --      Peak Flow --      Pain Score 04/11/17 2125 0     Pain Loc --      Pain Edu? --      Excl. in GC? --      Constitutional: Alert and oriented. Well appearing and  in no distress. HEENT   Head: Normocephalic and atraumatic.      Eyes: Conjunctivae are normal. Pupils equal and round.       Ears:         Nose: No congestion/rhinnorhea.   Mouth/Throat: Mucous membranes are moist.   Neck: No stridor. Cardiovascular/Chest: Normal rate, regular rhythm.  No murmurs, rubs, or gallops. Respiratory: Normal respiratory effort without tachypnea nor retractions. Mild end expiratory wheeze. Gastrointestinal: Soft. No distention, no guarding, no rebound. Nontender.  Obese.  Genitourinary/rectal:Deferred Musculoskeletal: Nontender with normal range of motion in all extremities. No joint effusions.  No lower extremity tenderness.  No edema. Neurologic:  Normal speech and language. No gross or focal neurologic deficits are appreciated. Skin:  Skin is warm, dry and intact. Cheeks are flushed. She has a small skin scratch that is oozing at her right chest wall where she scratched a mole. No hives visible. No extremities rash. Psychiatric: Mood and affect are normal. Speech and behavior are normal. Patient exhibits appropriate insight and judgment.   ____________________________________________  LABS (pertinent positives/negatives)  Labs Reviewed  COMPREHENSIVE METABOLIC PANEL - Abnormal; Notable for the following:       Result Value   Chloride 90 (*)    CO2 33 (*)    Glucose, Bld 277 (*)    BUN 53 (*)    Creatinine, Ser 1.77 (*)    ALT 12 (*)    GFR calc non Af Amer 24 (*)    GFR calc Af Amer 28 (*)    All other components within normal limits  TROPONIN I - Abnormal; Notable for the following:    Troponin I 0.05 (*)    All other components within normal limits  CBC WITH DIFFERENTIAL/PLATELET  TROPONIN  I    ____________________________________________    EKG I, Governor Rooksebecca Lisanne Ponce, MD, the attending physician have personally viewed and interpreted all ECGs.  79 bpm. Irregular, but appears to be sinus with PACs. Right bundle branch block. Normal axis. Anterolateral and diffuse T-wave inversion and mild ST segment depression. In comparison with EKG from December 26 patient has a new right bundle-branch block, but T-wave inversions anterolaterally are similar. ____________________________________________  RADIOLOGY All Xrays were viewed by me. Imaging interpreted by Radiologist.  Chest x-ray portable:  IMPRESSION: 1. No acute abnormality. 2. Stable mild cardiomegaly and mild chronic interstitial lung disease. 3. Bilateral shoulder degenerative changes with evidence of chronic large bilateral rotator cuff tears. __________________________________________  PROCEDURES  Procedure(s) performed: None  Critical Care performed: None  ____________________________________________   ED COURSE / ASSESSMENT AND PLAN  Pertinent labs & imaging results that were available during my care of the patient were reviewed by me and considered in my medical decision making (see chart for details).   History this is here with flushed cheeks and stating that she is just very anxious and feels like her face is burning. There are no obvious hives, but is erythematous. There is no evidence for cellulitis. I am suspicious that she is having allergic reaction to penicillin. I am going to try Benadryl and a dose of prednisone.  She has a mild end expiratory wheeze. She's not hypoxic although EMS brought her wearing oxygen which she only wears to sleep. 94% room air.   EKG shows diffuse ST segment and T-wave abnormalities, although somewhat similar to prior EKG. She does have a new right bundle branch block.  Patient is not complaining of chest pain or specific shortness of breath other than a mild wheeze.  Her  troponin 0.05. She does have chronic kidney disease, so it's unclear in the setting of this with the troponin means. I will go ahead and give her aspirin. Patient will have repeat troponin.  Patient care to be transferred to Dr. Manson Passey at shift change 11 PM. Pending repeat troponin. Disposition per repeat troponin. If negative, the discharge with my prepared discharge instructions.    CONSULTATIONS:   None Patient / Family / Caregiver informed of clinical course, medical decision-making process, and agree with plan.   I discussed return precautions, follow-up instructions, and discharge instructions with patient and/or family.  Discharge Instructions : You were evaluated for facial flushing and burning, and as we discussed I suspect this probably is due to allergic reaction from the penicillin. Please discontinue the penicillin. You may start the new antibiotic clindamycin as prescribed by your doctor. Next line for the allergic reaction, you may take over-the-counter Benadryl 25 mg every 4-6 hours as needed for itching or burning. You also being prescribed steroid Dosepak taper. Use as directed on labeling until completion.  Return to the emergency room immediately for any worsening symptoms including skin rash, trouble breathing, chest pain, dizziness or passing out, or any other symptoms concerning to you.  ___________________________________________   FINAL CLINICAL IMPRESSION(S) / ED DIAGNOSES   Final diagnoses:  Allergic reaction, initial encounter  Chronic renal failure, unspecified CKD stage              Note: This dictation was prepared with Dragon dictation. Any transcriptional errors that result from this process are unintentional    Governor Rooks, MD 04/11/17 2257

## 2017-04-11 NOTE — ED Triage Notes (Signed)
Per EMS report, patient was put on Penicillin on 7/25 by dentist for a dental abscess. Patient c/o flushed face on Thursday. Penicillin refused Penicillin two days later due to the flushed face, but was given one dose on Sunday and then c/o nausea and flushing.

## 2017-04-11 NOTE — ED Notes (Signed)
Dr Shaune PollackLord notified of pt's elevated Troponin (0.05 ng/mL) at this time.

## 2017-04-12 LAB — TROPONIN I: Troponin I: 0.06 ng/mL (ref <0.03)

## 2017-04-16 ENCOUNTER — Encounter: Payer: Self-pay | Admitting: Emergency Medicine

## 2017-04-16 ENCOUNTER — Emergency Department: Payer: Medicare Other

## 2017-04-16 ENCOUNTER — Emergency Department
Admission: EM | Admit: 2017-04-16 | Discharge: 2017-04-16 | Disposition: A | Discharge status: Home / Self Care | Payer: Medicare Other | Attending: Emergency Medicine | Admitting: Emergency Medicine

## 2017-04-16 DIAGNOSIS — E119 Type 2 diabetes mellitus without complications: Secondary | ICD-10-CM | POA: Insufficient documentation

## 2017-04-16 DIAGNOSIS — R1011 Right upper quadrant pain: Secondary | ICD-10-CM | POA: Diagnosis not present

## 2017-04-16 DIAGNOSIS — Z79899 Other long term (current) drug therapy: Secondary | ICD-10-CM | POA: Insufficient documentation

## 2017-04-16 DIAGNOSIS — Z794 Long term (current) use of insulin: Secondary | ICD-10-CM | POA: Diagnosis not present

## 2017-04-16 DIAGNOSIS — J449 Chronic obstructive pulmonary disease, unspecified: Secondary | ICD-10-CM | POA: Insufficient documentation

## 2017-04-16 DIAGNOSIS — I5042 Chronic combined systolic (congestive) and diastolic (congestive) heart failure: Secondary | ICD-10-CM | POA: Diagnosis not present

## 2017-04-16 DIAGNOSIS — R079 Chest pain, unspecified: Secondary | ICD-10-CM | POA: Diagnosis present

## 2017-04-16 DIAGNOSIS — N2889 Other specified disorders of kidney and ureter: Secondary | ICD-10-CM

## 2017-04-16 DIAGNOSIS — R109 Unspecified abdominal pain: Secondary | ICD-10-CM

## 2017-04-16 DIAGNOSIS — R1013 Epigastric pain: Secondary | ICD-10-CM | POA: Diagnosis not present

## 2017-04-16 LAB — URINALYSIS, COMPLETE (UACMP) WITH MICROSCOPIC
BACTERIA UA: NONE SEEN
Bilirubin Urine: NEGATIVE
Glucose, UA: NEGATIVE mg/dL
Ketones, ur: NEGATIVE mg/dL
NITRITE: NEGATIVE
PH: 7 (ref 5.0–8.0)
Protein, ur: NEGATIVE mg/dL
SPECIFIC GRAVITY, URINE: 1.008 (ref 1.005–1.030)

## 2017-04-16 LAB — CBC WITH DIFFERENTIAL/PLATELET
Basophils Absolute: 0.1 10*3/uL (ref 0–0.1)
Basophils Relative: 1 %
EOS PCT: 1 %
Eosinophils Absolute: 0.1 10*3/uL (ref 0–0.7)
HEMATOCRIT: 45.5 % (ref 35.0–47.0)
Hemoglobin: 15.2 g/dL (ref 12.0–16.0)
LYMPHS PCT: 17 %
Lymphs Abs: 2.2 10*3/uL (ref 1.0–3.6)
MCH: 30.3 pg (ref 26.0–34.0)
MCHC: 33.5 g/dL (ref 32.0–36.0)
MCV: 90.6 fL (ref 80.0–100.0)
MONO ABS: 1.1 10*3/uL — AB (ref 0.2–0.9)
MONOS PCT: 8 %
NEUTROS ABS: 9.8 10*3/uL — AB (ref 1.4–6.5)
Neutrophils Relative %: 73 %
PLATELETS: 257 10*3/uL (ref 150–440)
RBC: 5.02 MIL/uL (ref 3.80–5.20)
RDW: 14.5 % (ref 11.5–14.5)
WBC: 13.3 10*3/uL — ABNORMAL HIGH (ref 3.6–11.0)

## 2017-04-16 LAB — COMPREHENSIVE METABOLIC PANEL
ALT: 13 U/L — ABNORMAL LOW (ref 14–54)
ANION GAP: 14 (ref 5–15)
AST: 20 U/L (ref 15–41)
Albumin: 4 g/dL (ref 3.5–5.0)
Alkaline Phosphatase: 83 U/L (ref 38–126)
BILIRUBIN TOTAL: 0.6 mg/dL (ref 0.3–1.2)
BUN: 65 mg/dL — AB (ref 6–20)
CHLORIDE: 89 mmol/L — AB (ref 101–111)
CO2: 32 mmol/L (ref 22–32)
Calcium: 9.5 mg/dL (ref 8.9–10.3)
Creatinine, Ser: 1.8 mg/dL — ABNORMAL HIGH (ref 0.44–1.00)
GFR, EST AFRICAN AMERICAN: 27 mL/min — AB (ref >60)
GFR, EST NON AFRICAN AMERICAN: 24 mL/min — AB (ref >60)
Glucose, Bld: 147 mg/dL — ABNORMAL HIGH (ref 65–99)
POTASSIUM: 3.8 mmol/L (ref 3.5–5.1)
Sodium: 135 mmol/L (ref 135–145)
TOTAL PROTEIN: 7.4 g/dL (ref 6.5–8.1)

## 2017-04-16 LAB — TROPONIN I
TROPONIN I: 0.1 ng/mL — AB (ref <0.03)
TROPONIN I: 0.11 ng/mL — AB (ref <0.03)

## 2017-04-16 LAB — LIPASE, BLOOD: LIPASE: 17 U/L (ref 11–51)

## 2017-04-16 MED ORDER — GABAPENTIN 300 MG PO CAPS: 300.0000 mg | ORAL_CAPSULE | Freq: Two times a day (BID) | ORAL | Status: DC | PRN

## 2017-04-16 MED ORDER — GABAPENTIN 600 MG PO TABS
ORAL_TABLET | ORAL | Status: AC
  Administered 2017-04-16: 600 mg
  Filled 2017-04-16: qty 1

## 2017-04-16 MED ORDER — GABAPENTIN 300 MG PO CAPS: 600.0000 mg | ORAL_CAPSULE | ORAL | Status: DC

## 2017-04-16 MED ORDER — GABAPENTIN 300 MG PO CAPS: 300.0000 mg | ORAL_CAPSULE | ORAL | Status: DC

## 2017-04-16 MED ORDER — OMEPRAZOLE 40 MG PO CPDR: 40.0000 mg | DELAYED_RELEASE_CAPSULE | Freq: Every day | ORAL | 0 refills | Status: AC

## 2017-04-16 MED ORDER — SUCRALFATE 1 GM/10ML PO SUSP: 1.0000 g | Freq: Four times a day (QID) | ORAL | 0 refills | Status: AC

## 2017-04-16 MED ORDER — GI COCKTAIL ~~LOC~~
30.0000 mL | Freq: Once | ORAL | Status: AC
  Administered 2017-04-16: 30 mL via ORAL
  Filled 2017-04-16: qty 30

## 2017-04-16 MED ORDER — GABAPENTIN 300 MG PO CAPS: 600.0000 mg | ORAL_CAPSULE | Freq: Every day | ORAL | Status: DC

## 2017-04-16 NOTE — ED Notes (Addendum)
Pt given sandwich tray. Pt requesting home dose of Neurontin. Inquired with EDP, orders received.

## 2017-04-16 NOTE — ED Notes (Addendum)
Pt used bedpan for urine collection.   Patient transported to CT

## 2017-04-16 NOTE — ED Notes (Signed)
[facility] taken back to [facility] with [place]. Report called back to [facility] to [place].

## 2017-04-16 NOTE — ED Provider Notes (Signed)
[facility] Emergency Department Provider Note  ____________________________________________   First MD Initiated Contact with Patient 04/16/17 1805     (approximate)  I have reviewed the triage vital signs and the nursing notes.   HISTORY  Chief Complaint Chest Pain   HPI IVALEE Porter is a 83 y.o. female with a history of atrial fibrillation, diabetes and CHF who is presenting to the emergency department today with "chest spasms." She says that she has been having intermittent chest spasms ever since starting penicillin about one week ago. Because of an allergic reaction to the penicillin she has since stopped taking that medicine. However, the chest spasms have continued and are provoked every time she eats. She says that she also has a burning pain in her epigastrium and right upper quadrant. Denies any chest pain at this time. However, when she says the chest pain comes she says that it radiates to her back and her shoulders. She has experienced this pain multiple times throughout the week and has been transported today for further evaluation. She also says that she has had intermittent nausea and noticed some abdominal distention but had a normal bowel movement this morning.   Past Medical History:  Diagnosis Date  . Atrial fibrillation (HCC)   . Blood transfusion without reported diagnosis   . CHF (congestive heart failure) (HCC)   . Coronary artery disease   . Diabetes mellitus without complication (HCC)   . Hypertension   . Peripheral vascular disease (HCC)   . Shortness of breath dyspnea     Patient Active Problem List   Diagnosis Date Noted  . Yeast dermatitis 03/30/2017  . Pain in limb 11/15/2016  . Atherosclerosis of native arteries of extremity with intermittent claudication (HCC) 11/15/2016  . History of above knee amputation, right (HCC) 11/15/2016  . Lymphedema 08/26/2016  . Swelling of limb 06/15/2016  . Chronic diastolic heart  failure (HCC) 16/06/9603  . Chronic obstructive pulmonary disease (COPD) (HCC) 09/11/2015  . Atrial fibrillation with RVR (HCC) 08/18/2015  . Ischemia of lower extremity 05/26/2015  . Aortic heart valve narrowing 05/26/2015  . Arthritis urica 05/26/2015  . H/O angina pectoris 05/26/2015  . Peripheral blood vessel disorder (HCC) 05/26/2015  . Arteriosclerosis of coronary artery 01/25/2014  . Chronic kidney disease (CKD), stage III (moderate) 01/25/2014  . HLD (hyperlipidemia) 01/25/2014  . BP (high blood pressure) 01/25/2014  . Arthritis, degenerative 01/25/2014  . Hypertensive pulmonary vascular disease (HCC) 01/25/2014  . Diabetes mellitus, type 2 (HCC) 01/25/2014  . Herpes zona 07/14/2009    Past Surgical History:  Procedure Laterality Date  . above the knee amputation     right leg  . AMPUTATION Right 06/06/2015   Procedure: AMPUTATION ABOVE KNEE;  Surgeon: Annice Needy, MD;  Location: [facility] ORS;  Service: Vascular;  Laterality: Right;  . BACK SURGERY    . CORONARY ARTERY BYPASS GRAFT    . LOWER EXTREMITY ANGIOGRAPHY Left 11/24/2016   Procedure: Lower Extremity Angiography;  Surgeon: Renford Dills, MD;  Location: [facility] INVASIVE CV LAB;  Service: Cardiovascular;  Laterality: Left;  . LOWER EXTREMITY INTERVENTION  11/24/2016   Procedure: Lower Extremity Intervention;  Surgeon: Renford Dills, MD;  Location: [facility] INV[facility] CV [facility];  Service: Cardiovascular;;  . PERIPHERAL VASCULAR CATHETERIZATION Right 05/27/2015   Procedure: Lower Extremity Angiography;  Surgeon: Renford Dills, MD;  Location: [facility] CV [facility];  Service: Cardiovascular;  Laterality: Right;  . PERIPHERAL VASCULAR CATHETERIZATION  05/27/2015   Procedure:  Lower Extremity Intervention;  Surgeon: Renford DillsGregory G Schnier, MD;  Location: [facility] IN[facility];  Service: Cardiovascular;;  . PERIPHERAL VASCULAR CATHETERIZATION Left 03/09/2016   Procedure: Lower Extremity Angiography;  Surgeon: Renford DillsGregory G Schnier, MD;  Location:  [facility] IN[facility] CV [facility];  Service: Cardiovascular;  Laterality: Left;  . PERIPHERAL VASCULAR CATHETERIZATION  03/09/2016   Procedure: Peripheral Vascular Intervention;  Surgeon: Renford DillsGregory G Schnier, MD;  Location: [facility] INV[facility]IVE CV [facility];  Service: Cardiovascular;;  . VASCULAR SURGERY      Prior to Admission medications   Medication Sig Start Date End Date Taking? Authorizing Provider  acetaminophen (TYLENOL) 325 MG tablet Take 2 tablets (650 mg total) by mouth every 6 (six) hours as needed for mild pain (or Fever >/= 101). 05/31/15  Yes Stegmayer, Ranae PlumberKimberly A, PA-C  antiseptic oral rinse (BIOTENE) LIQD 15 mLs by Mouth Rinse route as needed for dry mouth.   Yes [provider]  apixaban (ELIQUIS) 2.5 MG TABS tablet Take 1 tablet (2.5 mg total) by mouth 2 (two) times daily. 08/24/15  Yes Adrian SaranMody, Sital, MD  aspirin EC 81 MG tablet Take 81 mg by mouth daily.   Yes [provider]  clindamycin (CLEOCIN) 300 MG capsule Take 300 mg by mouth 4 (four) times daily.   Yes [provider]  clopidogrel (PLAVIX) 75 MG tablet Take 1 tablet (75 mg total) by mouth daily. 03/10/16  Yes Schnier, Latina CraverGregory G, MD  Dextromethorphan-Guaifenesin (ROBITUSSIN DM) 10-100 MG/5ML liquid Take 15 mLs by mouth every 4 (four) hours as needed (cough).   Yes [provider]  diltiazem (CARDIZEM CD) 120 MG 24 hr capsule Take 1 capsule (120 mg total) by mouth daily. Patient taking differently: Take 120 mg by mouth every evening.  08/24/15  Yes Mody, Patricia PesaSital, MD  diphenhydrAMINE (BENADRYL) 25 MG tablet Take 25 mg by mouth every 4 (four) hours as needed.   Yes [provider]  docusate sodium (COLACE) 100 MG capsule Take 1 capsule (100 mg total) by mouth 2 (two) times daily. 05/31/15  Yes Stegmayer, Cala BradfordKimberly A, PA-C  fluticasone (FLONASE) 50 MCG/ACT nasal spray Place 2 sprays into both nostrils daily.   Yes [provider]  furosemide (LASIX) 40 MG tablet Take 40-80 mg by mouth See admin  instructions. Pt to take 80 mg in the morning, and an additional 40 mg during the day   Yes [provider]  gabapentin (NEURONTIN) 300 MG capsule Take 300-600 mg by mouth See admin instructions. Pt to take 300 mg twice daily as needed for pain management, and scheduled 600 mg at bedtime   Yes [provider]  glipiZIDE (GLUCOTROL) 5 MG tablet Take 5 mg by mouth 2 (two) times daily before a meal.   Yes [provider]  HYDROcodone-acetaminophen (NORCO/VICODIN) 5-325 MG tablet Take 1 tablet by mouth See admin instructions. Pt is scheduled to have 1 tablet 2 times daily, and 1 tablet every 4 hours as needed for pain. NOT TO EXCEED 3000 MG OF APAP DAILY FROM ALL SOURCES   Yes [provider]  insulin glargine (LANTUS) 100 UNIT/ML injection Inject 62 Units into the skin at bedtime.    Yes [provider]  insulin lispro (HUMALOG) 100 UNIT/ML injection Inject 2-15 Units into the skin 3 (three) times daily before meals. If 121-150=2 units, 151-200=3 units, 201-250=5 units, 251-300=8 units, 301-350=11 units, 351-400=15 units, 401 + call MD if BS is greater that 400    Yes [provider]  isosorbide mononitrate (IMDUR) 60  MG 24 hr tablet Take 60 mg by mouth daily.   Yes [provider]  ketotifen (ZADITOR) 0.025 % ophthalmic solution 1 drop 2 (two) times daily.   Yes [provider]  LORazepam (ATIVAN) 0.5 MG tablet Take 0.5 mg by mouth every 8 (eight) hours as needed for anxiety.   Yes [provider]  methylPREDNISolone (MEDROL DOSEPAK) 4 MG TBPK tablet Take as directed on label. 04/11/17  Yes Governor Rooks, MD  metolazone (ZAROXOLYN) 2.5 MG tablet Take 2.5 mg by mouth daily.   Yes [provider]  metoprolol tartrate (LOPRESSOR) 25 MG tablet Take 1 tablet (25 mg total) by mouth 3 (three) times daily. 06/10/15  Yes Danella Penton, MD  pantoprazole (PROTONIX) 40 MG tablet Take 1 tablet (40 mg total) by mouth 2 (two) times  daily. 06/10/15  Yes Danella Penton, MD  potassium chloride SA (K-DUR,KLOR-CON) 20 MEQ tablet Take 20 mEq by mouth 2 (two) times daily.   Yes [provider]  senna-docusate (SENOKOT-S) 8.6-50 MG tablet Take 1 tablet by mouth 2 (two) times daily.   Yes [provider]  spironolactone (ALDACTONE) 25 MG tablet Take 25 mg by mouth daily.   Yes [provider]  allopurinol (ZYLOPRIM) 100 MG tablet Take 100 mg by mouth daily.     [provider]  ALPRAZolam Prudy Feeler) 0.5 MG tablet Take 0.5 mg by mouth at bedtime.    [provider]  atorvastatin (LIPITOR) 20 MG tablet Take 20 mg by mouth every evening.     [provider]  benzonatate (TESSALON) 100 MG capsule Take 100 mg by mouth 3 (three) times daily as needed for cough.    [provider]  ferrous sulfate 325 (65 FE) MG tablet Take 325 mg by mouth 2 (two) times daily.    [provider]  magnesium oxide (MAG-OX) 400 MG tablet Take 400 mg by mouth daily.    [provider]  Multiple Vitamin (MULTIVITAMIN WITH MINERALS) TABS tablet Take 1 tablet by mouth daily.    [provider]  ondansetron (ZOFRAN) 4 MG tablet Take 4 mg by mouth every 6 (six) hours as needed for nausea or vomiting.    [provider]  ONE TOUCH ULTRA TEST test strip  10/11/16   [provider]  polyethylene glycol (MIRALAX / GLYCOLAX) packet Take 17 g by mouth every evening.     [provider]    Allergies Dilaudid [hydromorphone hcl]; Morphine and related; Other; and Penicillins  Family History  Problem Relation Age of Onset  . Diabetes Mother   . Diabetes Father     Social History Social History  Substance Use Topics  . Smoking status: Never Smoker  . Smokeless tobacco: Never Used  . Alcohol use No    Review of Systems  Constitutional: No fever/chills Eyes: No visual changes. ENT: No sore throat. Cardiovascular: Denies chest pain. Respiratory:  Denies shortness of breath. Gastrointestinal: No abdominal pain.  no vomiting.  No diarrhea.  No constipation. Genitourinary: Negative for dysuria. Musculoskeletal: Negative for back pain. Skin: Negative for rash. Neurological: Negative for headaches, focal weakness or numbness.   ____________________________________________   PHYSICAL EXAM:  VITAL SIGNS: ED Triage Vitals  Enc Vitals Group     BP 04/16/17 1814 (!) 117/95     Pulse Rate 04/16/17 1814 87     Resp 04/16/17 1814 19     Temp 04/16/17 1814 98 F (36.7 C)     Temp Source  04/16/17 1814 Oral     SpO2 04/16/17 1814 97 %     Weight 04/16/17 1815 200 lb (90.7 kg)     Height 04/16/17 1815 4\' 10"  (1.473 m)     Head Circumference --      Peak Flow --      Pain Score 04/16/17 1813 3     Pain Loc --      Pain Edu? --      Excl. in GC? --     Constitutional: Alert and oriented. Well appearing and in no acute distress. Eyes: Conjunctivae are normal.  Head: Atraumatic. Nose: No congestion/rhinnorhea. Mouth/Throat: Mucous membranes are moist.  Neck: No stridor.   Cardiovascular: Normal rate, regular rhythm. Grossly normal heart sounds.  Good peripheral circulation with equal and bilateral radial pulses.   Respiratory: Normal respiratory effort.  No retractions. Lungs CTAB. Gastrointestinal: Soft and nontender. No distention.  Musculoskeletal: No lower extremity tenderness nor edema.  No joint effusions. Neurologic:  Normal speech and language. No gross focal neurologic deficits are appreciated. Skin:  Skin is warm, dry and intact. No rash noted. Psychiatric: Mood and affect are normal. Speech and behavior are normal.  ____________________________________________   LABS (all labs ordered are listed, but only abnormal results are displayed)  Labs Reviewed  CBC WITH DIFFERENTIAL/PLATELET - Abnormal; Notable for the following:       Result Value   WBC 13.3 (*)    Neutro Abs 9.8 (*)    Monocytes Absolute 1.1 (*)     All other components within normal limits  COMPREHENSIVE METABOLIC PANEL - Abnormal; Notable for the following:    Chloride 89 (*)    Glucose, Bld 147 (*)    BUN 65 (*)    Creatinine, Ser 1.80 (*)    ALT 13 (*)    GFR calc non Af Amer 24 (*)    GFR calc Af Amer 27 (*)    All other components within normal limits  TROPONIN I - Abnormal; Notable for the following:    Troponin I 0.10 (*)    All other components within normal limits  URINALYSIS, COMPLETE (UACMP) WITH MICROSCOPIC - Abnormal; Notable for the following:    Color, Urine STRAW (*)    APPearance CLEAR (*)    Hgb urine dipstick SMALL (*)    Leukocytes, UA SMALL (*)    Squamous Epithelial / LPF 0-5 (*)    All other components within normal limits  TROPONIN I - Abnormal; Notable for the following:    Troponin I 0.11 (*)    All other components within normal limits  LIPASE, BLOOD   ____________________________________________  EKG  ED ECG REPORT I, Arelia Longest, the attending physician, personally viewed and interpreted this ECG.   Date: 04/16/2017  EKG Time: 1752  Rate: 90  Rhythm: normal sinus rhythm  Axis: Leftward axis  Intervals:right bundle branch block  ST&T Change: ST elevations in aVR with depressions in 1, 2, V2 through V5 which appear unchanged from previous EKG of July 30. Multiple T-wave inversions which are diffuse except for aVR and 3 which are also unchanged from previous.  ED ECG REPORT I, Arelia Longest, the attending physician, personally viewed and interpreted this ECG.   Date: 04/16/2017  EKG Time: 1817  Rate: 87  Rhythm: normal sinus rhythm  Axis: Normal  Intervals:right bundle branch block  ST&T Change: Diffuse ST depressions with elevation in aVR and T-wave inversions that are diffuse except for lead 3  and aVR. Unchanged appearance from previous EKGs.  ____________________________________________  RADIOLOGY  Chest x-ray without acute  disease. ____________________________________________   PROCEDURES  Procedure(s) performed:   Procedures  Critical Care performed:   ____________________________________________   INITIAL IMPRESSION / ASSESSMENT AND PLAN / ED COURSE  Pertinent labs & imaging results that were available during my care of the patient were reviewed by me and considered in my medical decision making (see chart for details).  ----------------------------------------- 7:20 PM on 04/16/2017 -----------------------------------------  Patient still without chest pain after GI cocktail but still with right upper quadrant pain radiating to the right flank. Second visit in about a week. The patient will have a CAT scan and urinalysis done as well. Also with her troponin slightly higher than her baseline. We will repeat.    ----------------------------------------- 10:20 PM on 04/16/2017 -----------------------------------------  Asian with initial improvement after the GI cocktail. Then ate a sandwich and now is having cramping to her chest radiating to her back as before. Also with cramping to the upper abdomen. CAT scan without any acute pathology. Mass to the right kidney which I discussed with the patient as well as her family. Troponin with a very minimal increased from 0.1 to 0.11. Patient will be discharged with sucralfate and Protonix. Possible ulcer. Symptoms ongoing for about 1 week. Discussed follow-up with GI. Patient says that she has had an esophageal dilatation in the past. She is understanding of this plan and willing to comply.  ____________________________________________   FINAL CLINICAL IMPRESSION(S) / ED DIAGNOSES  Chest pain. Abdominal pain. Kidney mass.    NEW MEDICATIONS STARTED DURING THIS VISIT:  New Prescriptions   No medications on file     Note:  This document was prepared using Dragon voice recognition software and may include unintentional dictation errors.      Myrna BlazerSchaevitz, David Matthew, MD 04/16/17 2221

## 2017-04-16 NOTE — ED Notes (Signed)
Pt alert and oriented X4, active, cooperative, pt in NAD. RR even and unlabored, color WNL.  Pt informed to return if any life threatening symptoms occur.   

## 2017-04-16 NOTE — ED Notes (Signed)
Date and time results received: 04/16/17 6:57 PM   Test: Trop  Critical Value: 0.10  Name of Provider Notified: Dr. Pershing ProudSchaevitz  Orders Received? Or Actions Taken?: Critical Value Acknowledged

## 2017-04-16 NOTE — ED Triage Notes (Addendum)
[facility]. Pt c/o substernal chest pain that started on Tuesday, pt was seen on Sunday of last week for alelrgic reaction to PCN. Pt states pain radiates to her back. Pt describes pain as being a spasm/burning pain that is worse when she tries to eat and states pain is in her esophagus. Pt presents to ED alert and oriented at this time, able to speak without difficulty at this time, respirations even and unlabored.

## 2017-04-16 NOTE — ED Notes (Signed)
ED Provider at bedside. 

## 2017-04-16 NOTE — ED Notes (Signed)
Pt brief changed and pt repositioned in bed.

## 2017-04-21 ENCOUNTER — Other Ambulatory Visit: Payer: Self-pay | Admitting: Student

## 2017-04-21 DIAGNOSIS — R1319 Other dysphagia: Secondary | ICD-10-CM

## 2017-04-21 DIAGNOSIS — Z8719 Personal history of other diseases of the digestive system: Secondary | ICD-10-CM

## 2017-04-21 DIAGNOSIS — R1310 Dysphagia, unspecified: Secondary | ICD-10-CM

## 2017-04-21 DIAGNOSIS — R1013 Epigastric pain: Secondary | ICD-10-CM

## 2017-04-25 ENCOUNTER — Ambulatory Visit
Admission: RE | Admit: 2017-04-25 | Discharge: 2017-04-25 | Disposition: A | Discharge status: Home / Self Care | Payer: Medicare Other | Source: Ambulatory Visit | Attending: Student | Admitting: Student

## 2017-04-25 DIAGNOSIS — Z8719 Personal history of other diseases of the digestive system: Secondary | ICD-10-CM | POA: Insufficient documentation

## 2017-04-25 DIAGNOSIS — R1013 Epigastric pain: Secondary | ICD-10-CM | POA: Diagnosis not present

## 2017-04-25 DIAGNOSIS — R1310 Dysphagia, unspecified: Secondary | ICD-10-CM | POA: Insufficient documentation

## 2017-04-25 DIAGNOSIS — K219 Gastro-esophageal reflux disease without esophagitis: Secondary | ICD-10-CM | POA: Insufficient documentation

## 2017-04-25 DIAGNOSIS — K224 Dyskinesia of esophagus: Secondary | ICD-10-CM | POA: Diagnosis not present

## 2017-04-25 DIAGNOSIS — R1319 Other dysphagia: Secondary | ICD-10-CM

## 2017-05-30 ENCOUNTER — Ambulatory Visit
Admission: RE | Admit: 2017-05-30 | Discharge: 2017-05-30 | Disposition: A | Discharge status: Home / Self Care | Payer: Medicare Other | Source: Ambulatory Visit | Attending: Internal Medicine | Admitting: Internal Medicine

## 2017-05-30 ENCOUNTER — Other Ambulatory Visit: Payer: Self-pay | Admitting: Internal Medicine

## 2017-05-30 DIAGNOSIS — I700 Atherosclerosis of aorta: Secondary | ICD-10-CM | POA: Diagnosis not present

## 2017-05-30 DIAGNOSIS — I2510 Atherosclerotic heart disease of native coronary artery without angina pectoris: Secondary | ICD-10-CM | POA: Insufficient documentation

## 2017-05-30 DIAGNOSIS — Z951 Presence of aortocoronary bypass graft: Secondary | ICD-10-CM | POA: Insufficient documentation

## 2017-05-30 DIAGNOSIS — K469 Unspecified abdominal hernia without obstruction or gangrene: Secondary | ICD-10-CM | POA: Diagnosis not present

## 2017-05-30 DIAGNOSIS — R509 Fever, unspecified: Secondary | ICD-10-CM

## 2017-05-30 DIAGNOSIS — K5730 Diverticulosis of large intestine without perforation or abscess without bleeding: Secondary | ICD-10-CM | POA: Insufficient documentation

## 2017-05-30 DIAGNOSIS — R071 Chest pain on breathing: Secondary | ICD-10-CM

## 2017-05-30 DIAGNOSIS — N289 Disorder of kidney and ureter, unspecified: Secondary | ICD-10-CM | POA: Insufficient documentation

## 2017-05-30 DIAGNOSIS — R1084 Generalized abdominal pain: Secondary | ICD-10-CM

## 2017-05-30 DIAGNOSIS — Z981 Arthrodesis status: Secondary | ICD-10-CM | POA: Insufficient documentation

## 2017-05-30 DIAGNOSIS — J9811 Atelectasis: Secondary | ICD-10-CM | POA: Diagnosis not present

## 2017-05-30 DIAGNOSIS — Z9889 Other specified postprocedural states: Secondary | ICD-10-CM | POA: Diagnosis not present

## 2017-05-30 DIAGNOSIS — M5134 Other intervertebral disc degeneration, thoracic region: Secondary | ICD-10-CM | POA: Diagnosis not present

## 2017-05-30 DIAGNOSIS — M5030 Other cervical disc degeneration, unspecified cervical region: Secondary | ICD-10-CM | POA: Insufficient documentation

## 2017-05-31 ENCOUNTER — Other Ambulatory Visit: Payer: Self-pay | Admitting: Internal Medicine

## 2017-05-31 DIAGNOSIS — N289 Disorder of kidney and ureter, unspecified: Secondary | ICD-10-CM

## 2017-06-06 ENCOUNTER — Ambulatory Visit
Admission: RE | Admit: 2017-06-06 | Discharge: 2017-06-06 | Disposition: A | Discharge status: Home / Self Care | Payer: Medicare Other | Source: Ambulatory Visit | Attending: Internal Medicine | Admitting: Internal Medicine

## 2017-06-06 DIAGNOSIS — N289 Disorder of kidney and ureter, unspecified: Secondary | ICD-10-CM | POA: Diagnosis not present

## 2017-06-06 DIAGNOSIS — N3289 Other specified disorders of bladder: Secondary | ICD-10-CM | POA: Diagnosis not present

## 2017-06-06 DIAGNOSIS — R195 Other fecal abnormalities: Secondary | ICD-10-CM | POA: Diagnosis not present

## 2017-06-06 MED ORDER — GADOBENATE DIMEGLUMINE 529 MG/ML IV SOLN
10.0000 mL | Freq: Once | INTRAVENOUS | Status: AC | PRN
  Administered 2017-06-06: 10 mL via INTRAVENOUS

## 2017-07-04 ENCOUNTER — Ambulatory Visit (INDEPENDENT_AMBULATORY_CARE_PROVIDER_SITE_OTHER): Payer: Medicare Other | Admitting: Vascular Surgery

## 2017-07-04 ENCOUNTER — Encounter (INDEPENDENT_AMBULATORY_CARE_PROVIDER_SITE_OTHER): Payer: Self-pay | Admitting: Vascular Surgery

## 2017-07-04 VITALS — BP 140/69 | HR 59 | Resp 16 | Wt 196.0 lb

## 2017-07-04 DIAGNOSIS — M7989 Other specified soft tissue disorders: Secondary | ICD-10-CM

## 2017-07-04 DIAGNOSIS — E785 Hyperlipidemia, unspecified: Secondary | ICD-10-CM | POA: Diagnosis not present

## 2017-07-04 DIAGNOSIS — I890 Lymphedema, not elsewhere classified: Secondary | ICD-10-CM

## 2017-07-04 DIAGNOSIS — L97922 Non-pressure chronic ulcer of unspecified part of left lower leg with fat layer exposed: Secondary | ICD-10-CM | POA: Diagnosis not present

## 2017-07-04 DIAGNOSIS — I2510 Atherosclerotic heart disease of native coronary artery without angina pectoris: Secondary | ICD-10-CM

## 2017-07-04 DIAGNOSIS — I7025 Atherosclerosis of native arteries of other extremities with ulceration: Secondary | ICD-10-CM | POA: Diagnosis not present

## 2017-07-06 DIAGNOSIS — L97929 Non-pressure chronic ulcer of unspecified part of left lower leg with unspecified severity: Secondary | ICD-10-CM | POA: Insufficient documentation

## 2017-07-06 DIAGNOSIS — I7025 Atherosclerosis of native arteries of other extremities with ulceration: Secondary | ICD-10-CM | POA: Insufficient documentation

## 2017-07-06 NOTE — Progress Notes (Signed)
MRN : [id number]  [name] is a 83 y.o. (June 28, 1925) female who presents with chief complaint of  Chief Complaint  Patient presents with  . Follow-up    left leg wound  .  History of Present Illness: The patient is seen for evaluation of painful lower extremities and diminished pulses associated with ulceration of the left shin.  The patient notes the ulcer has been present for multiple weeks and has not been improving.  It is very painful and has had some drainage.  There is a history of trauma noted by the patient.  The patient denies fever or chills.  the patient does have diabetes which has been difficult to control.  The patient denies rest pain or dangling of an extremity off the side of the bed during the night for relief.  Multiple prior interventions or surgeries.  No history of back problems or DJD of the lumbar sacral spine.   The patient denies amaurosis fugax or recent TIA symptoms. There are no recent neurological changes noted. The patient denies history of DVT, PE or superficial thrombophlebitis.  The patient denies recent episodes of angina or shortness of breath.   Current Meds  Medication Sig  . acetaminophen (TYLENOL) 325 MG tablet Take 2 tablets (650 mg total) by mouth every 6 (six) hours as needed for mild pain (or Fever >/= 101).  Marland Kitchen allopurinol (ZYLOPRIM) 100 MG tablet Take 100 mg by mouth daily.   Marland Kitchen ALPRAZolam (XANAX) 0.5 MG tablet Take 0.5 mg by mouth at bedtime.  Marland Kitchen antiseptic oral rinse (BIOTENE) LIQD 15 mLs by Mouth Rinse route as needed for dry mouth.  Marland Kitchen apixaban (ELIQUIS) 2.5 MG TABS tablet Take 1 tablet (2.5 mg total) by mouth 2 (two) times daily.  Marland Kitchen aspirin EC 81 MG tablet Take 81 mg by mouth daily.  Marland Kitchen atorvastatin (LIPITOR) 20 MG tablet Take 20 mg by mouth every evening.   . benzonatate (TESSALON) 100 MG capsule Take 100 mg by mouth 3 (three) times daily as needed for cough.  . clindamycin (CLEOCIN) 300 MG capsule Take 300 mg by mouth 4 (four)  times daily.  . clopidogrel (PLAVIX) 75 MG tablet Take 1 tablet (75 mg total) by mouth daily.  Marland Kitchen Dextromethorphan-Guaifenesin (ROBITUSSIN DM) 10-100 MG/5ML liquid Take 15 mLs by mouth every 4 (four) hours as needed (cough).  Marland Kitchen diltiazem (CARDIZEM CD) 120 MG 24 hr capsule Take 1 capsule (120 mg total) by mouth daily. (Patient taking differently: Take 120 mg by mouth every evening. )  . diphenhydrAMINE (BENADRYL) 25 MG tablet Take 25 mg by mouth every 4 (four) hours as needed.  . docusate sodium (COLACE) 100 MG capsule Take 1 capsule (100 mg total) by mouth 2 (two) times daily.  . ferrous sulfate 325 (65 FE) MG tablet Take 325 mg by mouth 2 (two) times daily.  . fluticasone (FLONASE) 50 MCG/ACT nasal spray Place 2 sprays into both nostrils daily.  . furosemide (LASIX) 40 MG tablet Take 40-80 mg by mouth See admin instructions. Pt to take 80 mg in the morning, and an additional 40 mg during the day  . gabapentin (NEURONTIN) 300 MG capsule Take 300-600 mg by mouth See admin instructions. Pt to take 300 mg twice daily as needed for pain management, and scheduled 600 mg at bedtime  . glipiZIDE (GLUCOTROL) 5 MG tablet Take 5 mg by mouth 2 (two) times daily before a meal.  . HYDROcodone-acetaminophen (NORCO/VICODIN) 5-325 MG tablet Take 1 tablet by mouth  See admin instructions. Pt is scheduled to have 1 tablet 2 times daily, and 1 tablet every 4 hours as needed for pain. NOT TO EXCEED 3000 MG OF APAP DAILY FROM ALL SOURCES  . insulin glargine (LANTUS) 100 UNIT/ML injection Inject 62 Units into the skin at bedtime.   . insulin lispro (HUMALOG) 100 UNIT/ML injection Inject 2-15 Units into the skin 3 (three) times daily before meals. If 121-150=2 units, 151-200=3 units, 201-250=5 units, 251-300=8 units, 301-350=11 units, 351-400=15 units, 401 + call MD if BS is greater that 400   . isosorbide mononitrate (IMDUR) 60 MG 24 hr tablet Take 60 mg by mouth daily.  Marland Kitchen ketotifen (ZADITOR) 0.025 % ophthalmic solution 1  drop 2 (two) times daily.  Marland Kitchen LORazepam (ATIVAN) 0.5 MG tablet Take 0.5 mg by mouth every 8 (eight) hours as needed for anxiety.  . magnesium oxide (MAG-OX) 400 MG tablet Take 400 mg by mouth daily.  . methylPREDNISolone (MEDROL DOSEPAK) 4 MG TBPK tablet Take as directed on label.  . metolazone (ZAROXOLYN) 2.5 MG tablet Take 2.5 mg by mouth daily.  . metoprolol tartrate (LOPRESSOR) 25 MG tablet Take 1 tablet (25 mg total) by mouth 3 (three) times daily.  . Multiple Vitamin (MULTIVITAMIN WITH MINERALS) TABS tablet Take 1 tablet by mouth daily.  Marland Kitchen omeprazole (PRILOSEC) 40 MG capsule Take 1 capsule (40 mg total) by mouth daily.  . ondansetron (ZOFRAN) 4 MG tablet Take 4 mg by mouth every 6 (six) hours as needed for nausea or vomiting.  . ONE TOUCH ULTRA TEST test strip   . pantoprazole (PROTONIX) 40 MG tablet Take 1 tablet (40 mg total) by mouth 2 (two) times daily.  . polyethylene glycol (MIRALAX / GLYCOLAX) packet Take 17 g by mouth every evening.   . potassium chloride SA (K-DUR,KLOR-CON) 20 MEQ tablet Take 20 mEq by mouth 2 (two) times daily.  Marland Kitchen senna-docusate (SENOKOT-S) 8.6-50 MG tablet Take 1 tablet by mouth 2 (two) times daily.  Marland Kitchen spironolactone (ALDACTONE) 25 MG tablet Take 25 mg by mouth daily.  . sucralfate (CARAFATE) 1 GM/10ML suspension Take 10 mLs (1 g total) by mouth 4 (four) times daily.    Past Medical History:  Diagnosis Date  . Atrial fibrillation (HCC)   . Blood transfusion without reported diagnosis   . CHF (congestive heart failure) (HCC)   . Coronary artery disease   . Diabetes mellitus without complication (HCC)   . Hypertension   . Peripheral vascular disease (HCC)   . Shortness of breath dyspnea     Past Surgical History:  Procedure Laterality Date  . above the knee amputation     right leg  . AMPUTATION Right 06/06/2015   Procedure: AMPUTATION ABOVE KNEE;  Surgeon: Annice Needy, MD;  Location: [facility] ORS;  Service: Vascular;  Laterality: Right;  . BACK SURGERY     . CORONARY ARTERY BYPASS GRAFT    . LOWER EXTREMITY ANGIOGRAPHY Left 11/24/2016   Procedure: Lower Extremity Angiography;  Surgeon: Renford Dills, MD;  Location: [facility];  Service: Cardiovascular;  Laterality: Left;  . LOWER EXTREMITY INTERVENTION  11/24/2016   Procedure: Lower Extremity Intervention;  Surgeon: Renford Dills, MD;  Location: [facility];  Service: Cardiovascular;;  . PERIPHERAL VASCULAR CATHETERIZATION Right 05/27/2015   Procedure: Lower Extremity Angiography;  Surgeon: Renford Dills, MD;  Location: [facility];  Service: Cardiovascular;  Laterality: Right;  . PERIPHERAL VASCULAR CATHETERIZATION  05/27/2015   Procedure: Lower Extremity Intervention;  Surgeon: Renford DillsGregory G Jalesia Loudenslager, MD;  [facility];  Service: Cardiovascular;;  . PERIPHERAL VASCULAR CATHETERIZATION Left 03/09/2016   Procedure: Lower Extremity Angiography;  Surgeon: Renford DillsGregory G Rocky Rishel, MD;  Location: [facility];  Service: Cardiovascular;  Laterality: Left;  . PERIPHERAL VASCULAR CATHETERIZATION  03/09/2016   Procedure: Peripheral Vascular Intervention;  Surgeon: Renford DillsGregory G Kewanna Kasprzak, MD;  Location: [facility];  Service: Cardiovascular;;  . VASCULAR SURGERY      Social History Social History  Substance Use Topics  . Smoking status: Never Smoker  . Smokeless tobacco: Never Used  . Alcohol use No    Family History Family History  Problem Relation Age of Onset  . Diabetes Mother   . Diabetes Father     Allergies  Allergen Reactions  . Dilaudid [Hydromorphone Hcl] Nausea And Vomiting  . Morphine And Related Other (See Comments)    Reaction:  Lightheadedness  Patient's daughter says she can take morphine.  . Other Other (See Comments)    [facility] [place] - Pt has No Known Allergies  . Penicillins Other (See Comments)    Nausea and flushed face     REVIEW OF SYSTEMS (Negative unless  checked)  Constitutional: [] Weight loss  [] Fever  [] Chills Cardiac: [] Chest pain   [] Chest pressure   [] Palpitations   [] Shortness of breath when laying flat   [] Shortness of breath with exertion. Vascular:  [] Pain in legs with walking   [x] Pain in legs at rest  [] History of DVT   [] Phlebitis   [x] Swelling in legs   [] Varicose veins   [x] Non-healing ulcers Pulmonary:   [] Uses home oxygen   [] Productive cough   [] Hemoptysis   [] Wheeze  [] COPD   [] Asthma Neurologic:  [] Dizziness   [] Seizures   [] History of stroke   [] History of TIA  [] Aphasia   [] Vissual changes   [] Weakness or numbness in arm   [] Weakness or numbness in leg Musculoskeletal:   [] Joint swelling   [] Joint pain   [] Low back pain Hematologic:  [] Easy bruising  [] Easy bleeding   [] Hypercoagulable state   [] Anemic Gastrointestinal:  [] Diarrhea   [] Vomiting  [] Gastroesophageal reflux/heartburn   [] Difficulty swallowing. Genitourinary:  [x] Chronic kidney disease   [] Difficult urination  [] Frequent urination   [] Blood in urine Skin:  [] Rashes   [] Ulcers  Psychological:  [] History of anxiety   []  History of major depression.  Physical Examination  Vitals:   07/04/17 1439  BP: 140/69  Pulse: (!) 59  Resp: 16  Weight: 196 lb (88.9 kg)   Body mass index is 40.96 kg/m. Gen: WD/WN, NAD Seen in a wheelchair Head: [place]/AT, No temporalis wasting.  Ear/Nose/Throat: Hearing grossly intact, nares w/o erythema or drainage Eyes: PER, EOMI, sclera nonicteric.  Neck: Supple, no large masses.   Pulmonary:  Good air movement, no audible wheezing bilaterally, no use of accessory muscles.  Cardiac: RRR, no JVD Vascular: two ulcers of the left lower extremity with fat exposed no granulation no infection Vessel Right Left  Radial Palpable Palpable  PT AKA Not Palpable  DP AKA Not Palpable  Gastrointestinal: Non-distended. No guarding/no peritoneal signs.  Musculoskeletal: M/S 5/5 throughout upper and left lower extremity.  Right AKA.   Neurologic: CN 2-12 intact. Symmetrical.  Speech is fluent. Motor exam as listed above. Psychiatric: Judgment intact, Mood & affect appropriate for pt's clinical situation. Dermatologic: No rashes or ulcers noted.  No changes consistent with cellulitis. Lymph : No lichenification or skin changes of  chronic lymphedema.  CBC Lab Results  Component Value Date   WBC 13.3 (H) 04/16/2017   HGB 15.2 04/16/2017   HCT 45.5 04/16/2017   MCV 90.6 04/16/2017   PLT 257 04/16/2017    BMET    Component Value Date/Time   NA 135 04/16/2017 1813   K 3.8 04/16/2017 1813   CL 89 (L) 04/16/2017 1813   CO2 32 04/16/2017 1813   GLUCOSE 147 (H) 04/16/2017 1813   BUN 65 (H) 04/16/2017 1813   CREATININE 1.80 (H) 04/16/2017 1813   CALCIUM 9.5 04/16/2017 1813   GFRNONAA 24 (L) 04/16/2017 1813   GFRAA 27 (L) 04/16/2017 1813   CrCl cannot be calculated (Patient's most recent lab result is older than the maximum 21 days allowed.).  COAG Lab Results  Component Value Date   INR 1.24 06/05/2015   INR 1.09 05/26/2015    Radiology No results found.   Assessment/Plan 1. Atherosclerosis of native arteries of the extremities with ulceration (HCC) Recommend:  Patient should undergo ABI of the lower extremity ASAP because there has been a significant deterioration in the patient's lower extremity symptoms.  The patient states they are having increased pain and the ulcers are nonhealing The risks and benefits as well as the alternatives were discussed in detail with the patient.  All questions were answered.  Patient agrees to proceed and understands this could be a prelude to angiography and intervention.  The patient will follow up with me in the office to review the studies.   - VAS [place] ABI WITH/WO TBI; Future  2. Skin ulcer of left lower leg with fat layer exposed (HCC) Begin silvadene cream bid  3. Swelling of limb continue compression  4. Lymphedema See #3  5. Arteriosclerosis of coronary  artery Continue cardiac and antihypertensive medications as already ordered and reviewed, no changes at this time.  Continue statin as ordered and reviewed, no changes at this time  Nitrates PRN for chest pain   6. Hyperlipidemia, unspecified hyperlipidemia type Continue statin as ordered and reviewed, no changes at this time     Levora Dredge, MD  07/06/2017 3:04 PM

## 2017-07-25 ENCOUNTER — Ambulatory Visit (INDEPENDENT_AMBULATORY_CARE_PROVIDER_SITE_OTHER): Payer: Medicare Other | Admitting: Vascular Surgery

## 2017-07-25 ENCOUNTER — Encounter (INDEPENDENT_AMBULATORY_CARE_PROVIDER_SITE_OTHER): Payer: Medicare Other

## 2017-07-28 ENCOUNTER — Encounter (INDEPENDENT_AMBULATORY_CARE_PROVIDER_SITE_OTHER): Payer: Medicare Other

## 2017-07-28 ENCOUNTER — Ambulatory Visit (INDEPENDENT_AMBULATORY_CARE_PROVIDER_SITE_OTHER): Payer: Medicare Other | Admitting: Vascular Surgery

## 2017-08-13 DEATH — deceased

## 2017-10-06 ENCOUNTER — Encounter (INDEPENDENT_AMBULATORY_CARE_PROVIDER_SITE_OTHER): Payer: Medicare Other

## 2017-10-06 ENCOUNTER — Ambulatory Visit (INDEPENDENT_AMBULATORY_CARE_PROVIDER_SITE_OTHER): Payer: Medicare Other | Admitting: Vascular Surgery
